# Patient Record
Sex: Female | Born: 1935
Health system: Southern US, Community
[De-identification: ages and names within clinical notes are randomized; demographics above are authoritative.]

## PROBLEM LIST (undated history)

## (undated) DIAGNOSIS — M1990 Unspecified osteoarthritis, unspecified site: Secondary | ICD-10-CM

## (undated) DIAGNOSIS — E079 Disorder of thyroid, unspecified: Secondary | ICD-10-CM

## (undated) DIAGNOSIS — E785 Hyperlipidemia, unspecified: Secondary | ICD-10-CM

## (undated) DIAGNOSIS — M8580 Other specified disorders of bone density and structure, unspecified site: Secondary | ICD-10-CM

## (undated) DIAGNOSIS — F329 Major depressive disorder, single episode, unspecified: Secondary | ICD-10-CM

## (undated) DIAGNOSIS — G479 Sleep disorder, unspecified: Secondary | ICD-10-CM

## (undated) DIAGNOSIS — T8859XA Other complications of anesthesia, initial encounter: Secondary | ICD-10-CM

## (undated) DIAGNOSIS — F32A Depression, unspecified: Secondary | ICD-10-CM

## (undated) HISTORY — DX: Disorder of thyroid, unspecified: E07.9

## (undated) HISTORY — DX: Hyperlipidemia, unspecified: E78.5

## (undated) HISTORY — DX: Sleep disorder, unspecified: G47.9

## (undated) HISTORY — DX: Depression, unspecified: F32.A

## (undated) HISTORY — DX: Other specified disorders of bone density and structure, unspecified site: M85.80

## (undated) HISTORY — DX: Unspecified osteoarthritis, unspecified site: M19.90

## (undated) HISTORY — PX: APPENDECTOMY: SHX54

## (undated) HISTORY — DX: Major depressive disorder, single episode, unspecified: F32.9

## (undated) HISTORY — PX: ABDOMINAL HYSTERECTOMY: SHX81

---
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
## 2003-05-15 ENCOUNTER — Encounter: Payer: Self-pay | Admitting: Internal Medicine

## 2005-04-08 LAB — HM COLONOSCOPY: HM Colonoscopy: NORMAL

## 2010-05-01 ENCOUNTER — Ambulatory Visit: Payer: Self-pay | Admitting: Internal Medicine

## 2010-05-01 DIAGNOSIS — E785 Hyperlipidemia, unspecified: Secondary | ICD-10-CM | POA: Insufficient documentation

## 2010-05-01 DIAGNOSIS — F329 Major depressive disorder, single episode, unspecified: Secondary | ICD-10-CM

## 2010-05-01 DIAGNOSIS — E039 Hypothyroidism, unspecified: Secondary | ICD-10-CM

## 2010-05-01 DIAGNOSIS — M159 Polyosteoarthritis, unspecified: Secondary | ICD-10-CM

## 2010-05-01 DIAGNOSIS — G479 Sleep disorder, unspecified: Secondary | ICD-10-CM

## 2010-07-03 NOTE — Assessment & Plan Note (Signed)
Summary: NEW PT TO [facility]   Vital Signs:  Patient profile:   75 year old female Height:      63.5 inches Weight:      134 pounds BMI:     23.45 Temp:     98.3 degrees F oral Pulse rate:   60 / minute Pulse rhythm:   regular BP sitting:   110 / 68  (left arm) Cuff size:   regular  Vitals Entered By: Mervin Hack CMA Duncan Dull) (May 01, 2010 12:00 PM) CC: new patient to establish care   History of Present Illness: Recently moved to [facility] from [place] area Establishing [facility] care [facility]  Has been feeling okay in general  Longstanding sleep problems Mirtazapine is variably effective Occ was groggy in Am so tried 1/2 but not that great No other meds tried Occ daytime tiredness but not consistent Does best with ibuprofen 600mg  at bedtime Low back pain can be the problem at times  Did have depression after father's death in 26-Oct-1970 has had some recurrent problems with that  Mild hand arthritis problems besides back mostly in hands generally doesn't need daytime meds  Long term laxative dependent uses MOM every night occ adds dulcolax Has tried multiple different things including miralax, probiotics, etc  Has hypothryoidism goes back decades  History of hypercholesterolemia on meds for 3-4 years Not excited about meds for this   Preventive Screening-Counseling & Management  Alcohol-Tobacco     Smoking Status: quit  Allergies (verified): No Known Drug Allergies  Past History:  Past Medical History: Depression Hyperlipidemia Hypothyroidism Osteoarthritis Osteopenia--mild Sleep disorder  Past Surgical History: Hysterectomy/appendectomy-- 1986 Tonsillectomy/adenoids-- 1946  Family History: Dad died of MI at 75. Had HTN Mom died @ 80    dementia Brother died of MI @56 . Had stroke, brain aneurysm. 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 Eyes:  Denies double vision and vision loss-1 eye. ENT:  Denies decreased hearing and ringing in ears; own teeth--regular with dentist. CV:  Denies chest pain or discomfort, difficulty breathing at night, difficulty breathing while lying down, fainting, lightheadness, palpitations, and shortness of breath with exertion; plans to restart regular walking and perhaps try Tai Chi. Resp:  Complains of cough; denies shortness of breath; occ AM clearing cough. GI:  Complains of constipation; denies abdominal pain, bloody stools, dark tarry stools, indigestion, nausea, and vomiting. GU:  Denies dysuria and incontinence; occ urgency and then incomplete emptying Has menopausal symptoms--did well on the hormones but taken off by gyn. MS:  Complains of joint pain; denies joint swelling. Derm:  Denies lesion(s) and rash. Neuro:  Denies headaches, numbness, tingling, and weakness. Psych:  Complains of anxiety and depression; depression has been quiet "worry wart" but no major persistent problems. Heme:  Denies abnormal bruising and enlarge lymph nodes. Allergy:  Denies persistent infections and sneezing.  Physical Exam  General:  alert and normal appearance.   Eyes:  pupils equal, pupils round, and pupils reactive to light.   Mouth:  no erythema, no exudates, and no lesions.   Neck:  supple, no masses, no thyromegaly, no carotid bruits, and no cervical lymphadenopathy.   Lungs:  normal respiratory effort, no intercostal retractions, no accessory muscle use, and normal breath sounds.  Heart:  normal rate, regular rhythm, no murmur, and no gallop.   Abdomen:  soft, non-tender, and no masses.   Msk:  no joint tenderness and no joint swelling.   Pulses:  faint or absent in feet but seems to have good circulation Extremities:  no  edema Neurologic:  alert & oriented X3, strength normal in all extremities, and gait normal.   Skin:  no rashes and no suspicious lesions.   Axillary Nodes:  No palpable lymphadenopathy Psych:  normally interactive, good eye contact, not anxious appearing, and not depressed appearing.     Impression & Recommendations:  Problem # 1:  DEPRESSION (ICD-311) Assessment Comment Only seems controlled on the medication will continue  Her updated medication list for this problem includes:    Mirtazapine 30 Mg Tabs (Mirtazapine) .Marland Kitchen... Take 1 by mouth once daily  Problem # 2:  OSTEOARTHRITIS (ICD-715.90) Assessment: Unchanged okay to take ibuprofen 600mg  at bedtime No GI history so will hold off on PPI for now  Problem # 3:  SLEEP DISORDER, CHRONIC (ICD-780.50) Assessment: Comment Only still an issue though no serious daytime somolence continue mirtazapine since it seems to be helping mood  Problem # 4:  HYPERLIPIDEMIA (ICD-272.4) Assessment: Unchanged okay with meds given strong FH  Her updated medication list for this problem includes:    Simvastatin 20 Mg Tabs (Simvastatin) .Marland Kitchen... Take 1 by mouth once daily  Problem # 5:  HYPOTHYROIDISM (ICD-244.9) Assessment: Comment Only labs reviewed from July  Her updated medication list for this problem includes:    Synthroid 75 Mcg Tabs (Levothyroxine sodium) .Marland Kitchen... Take 1 by mouth once daily  Complete Medication List: 1)  Synthroid 75 Mcg Tabs (Levothyroxine sodium) .... Take 1 by mouth once daily 2)  Simvastatin 20 Mg Tabs (Simvastatin) .... Take 1 by mouth once daily 3)  Mirtazapine 30 Mg Tabs (Mirtazapine) .... Take 1 by mouth once daily 4)  Fish Oil 1000 Mg Caps (Omega-3 fatty acids) .... Once daily 5)  Vision Plus Caps (Multiple vitamins-minerals) .... Once daily 6)  Milk of Magnesia 400 Mg/32ml Susp (Magnesium hydroxide) .... As needed  Other Orders: TD Toxoids IM 7 YR + (16109) Admin 1st Vaccine (60454)  Patient  Instructions: 1)  Please schedule a follow-up appointment in 6 months .  2)  It is okay to take ibuprofen 600mg  at bedtime. Please stop and call if any stomach trouble or change in bowels   Orders Added: 1)  New Patient Level IV [99204] 2)  TD Toxoids IM 7 YR + [90714] 3)  Admin 1st Vaccine [90471]   Immunization History:  Tetanus/Td Immunization History:    Tetanus/Td:  Td (05/01/2010)  Influenza Immunization History:    Influenza:  Historical (03/03/2010)  Pneumovax Immunization History:    Pneumovax:  Historical (06/04/2007)  Zostavax History:    Zostavax # 1:  Zostavax (03/03/2010)  Immunizations Administered:  Tetanus Vaccine:    Vaccine Type: Td    Site: left deltoid    Mfr: Sanofi Pasteur    Dose: 0.5 ml    Route: IM    Given by: Mervin Hack CMA (AAMA)    Exp. Date: 07/05/2011    Lot #: [id number]    VIS given: 04/20/08 version given May 01, 2010.   Immunization History:  Influenza Immunization History:    Influenza:  Historical (03/03/2010)  Zostavax History:    Zostavax # 1:  Zostavax (03/03/2010)  Pneumovax Immunization History:    Pneumovax:  Historical (06/04/2007)  Immunizations Administered:  Tetanus  Vaccine:    Vaccine Type: Td    Site: left deltoid    Mfr: Sanofi Pasteur    Dose: 0.5 ml    Route: IM    Given by: Mervin Hack CMA (AAMA)    Exp. Date: 07/05/2011    Lot #: [id number]    VIS given: 04/20/08 version given May 01, 2010.  Current Allergies (reviewed today): No known allergies       Colonoscopy  Procedure date:  04/08/2005  Findings:      Results: Normal.

## 2010-07-03 NOTE — Letter (Signed)
Summary: [employer] conservation officer [employer] Wellness Visit Questionnaire   [employer] Annual Wellness Visit Questionnaire   Imported By: Beau Fanny 05/01/2010 15:29:56  _____________________________________________________________________  External Attachment:    Type:   Image     Comment:   External Document

## 2010-07-06 NOTE — Letter (Signed)
Summary: Patient Questionnaire  Patient Questionnaire   Imported By: Beau Fanny 05/01/2010 15:30:24  _____________________________________________________________________  External Attachment:    Type:   Image     Comment:   External Document

## 2010-07-06 NOTE — Letter (Signed)
Summary: [facility] Records  [facility] Records   Imported By: Beau Fanny 05/03/2010 09:00:14  _____________________________________________________________________  External Attachment:    Type:   Image     Comment:   External Document

## 2010-08-30 ENCOUNTER — Telehealth: Payer: Self-pay | Admitting: *Deleted

## 2010-08-30 MED ORDER — AMOXICILLIN 500 MG PO CAPS: 500.0000 mg | ORAL_CAPSULE | Freq: Two times a day (BID) | ORAL | Status: AC

## 2010-08-30 NOTE — Telephone Encounter (Signed)
Typically do not call in abx without seeing pt but since we have limited appt availability, ok to send in amoxicillin 500 mg - 1 tab by mouth twice daily for 10 days, no refills.  Please verify that she is not allergic to pcn.

## 2010-08-30 NOTE — Telephone Encounter (Signed)
Rx call to pharmacy, patient notified.

## 2010-08-30 NOTE — Telephone Encounter (Signed)
Pt's husband was seen at [facility] yesterday for URI symptoms and the pt states she now has the same thing.  She is asking that the meds prescribed for her husband yesterday also be prescribed for her.  Uses [facility].

## 2010-08-30 NOTE — Telephone Encounter (Signed)
See my note below

## 2010-10-24 ENCOUNTER — Encounter: Payer: Self-pay | Admitting: Internal Medicine

## 2010-10-30 ENCOUNTER — Encounter: Payer: Self-pay | Admitting: Internal Medicine

## 2010-10-30 ENCOUNTER — Ambulatory Visit (INDEPENDENT_AMBULATORY_CARE_PROVIDER_SITE_OTHER): Payer: Medicare Other | Admitting: Internal Medicine

## 2010-10-30 VITALS — BP 112/70 | HR 68 | Temp 98.0°F | Wt 135.0 lb

## 2010-10-30 DIAGNOSIS — K5909 Other constipation: Secondary | ICD-10-CM

## 2010-10-30 DIAGNOSIS — E039 Hypothyroidism, unspecified: Secondary | ICD-10-CM

## 2010-10-30 DIAGNOSIS — F329 Major depressive disorder, single episode, unspecified: Secondary | ICD-10-CM

## 2010-10-30 DIAGNOSIS — E785 Hyperlipidemia, unspecified: Secondary | ICD-10-CM

## 2010-10-30 DIAGNOSIS — F3289 Other specified depressive episodes: Secondary | ICD-10-CM

## 2010-10-30 DIAGNOSIS — G479 Sleep disorder, unspecified: Secondary | ICD-10-CM

## 2010-10-30 DIAGNOSIS — M1990 Unspecified osteoarthritis, unspecified site: Secondary | ICD-10-CM

## 2010-10-30 LAB — LIPID PANEL
Cholesterol: 228 mg/dL — ABNORMAL HIGH (ref 0–200)
HDL: 101.1 mg/dL (ref >39.00)
Triglycerides: 51 mg/dL (ref 0.0–149.0)
VLDL: 10.2 mg/dL (ref 0.0–40.0)

## 2010-10-30 LAB — CBC WITH DIFFERENTIAL/PLATELET
Eosinophils Relative: 5.3 % — ABNORMAL HIGH (ref 0.0–5.0)
HCT: 35.9 % — ABNORMAL LOW (ref 36.0–46.0)
Lymphs Abs: 1.2 10*3/uL (ref 0.7–4.0)
Monocytes Relative: 8 % (ref 3.0–12.0)
Platelets: 201 10*3/uL (ref 150.0–400.0)
WBC: 4.6 10*3/uL (ref 4.5–10.5)

## 2010-10-30 LAB — T4, FREE: Free T4: 0.76 ng/dL (ref 0.60–1.60)

## 2010-10-30 LAB — BASIC METABOLIC PANEL
BUN: 23 mg/dL (ref 6–23)
Calcium: 9.6 mg/dL (ref 8.4–10.5)
Creatinine, Ser: 0.8 mg/dL (ref 0.4–1.2)
GFR: 70.66 mL/min (ref >60.00)

## 2010-10-30 LAB — TSH: TSH: 4.07 u[IU]/mL (ref 0.35–5.50)

## 2010-10-30 LAB — HEPATIC FUNCTION PANEL
AST: 30 U/L (ref 0–37)
Albumin: 4.1 g/dL (ref 3.5–5.2)

## 2010-10-30 LAB — LDL CHOLESTEROL, DIRECT: Direct LDL: 129.1 mg/dL

## 2010-10-30 MED ORDER — LEVOTHYROXINE SODIUM 75 MCG PO TABS: 75.0000 ug | ORAL_TABLET | Freq: Every day | ORAL | Status: DC

## 2010-10-30 MED ORDER — MIRTAZAPINE 30 MG PO TABS: 30.0000 mg | ORAL_TABLET | Freq: Every day | ORAL | Status: DC

## 2010-10-30 MED ORDER — SIMVASTATIN 20 MG PO TABS: 20.0000 mg | ORAL_TABLET | Freq: Every day | ORAL | Status: DC

## 2010-10-30 NOTE — Progress Notes (Signed)
Subjective:    Patient ID: Robin Logan, female    DOB: 11/07/1935, 75 y.o.   MRN: [id number]  HPI Husband just had hernia surgery---laying low due to this.  He is doing well No new concerns from her  Really "loves it" at [facility] Involved with wellness program Independent exercise an zumba also  Continues to be laxative dependent MOM every night---adds dulcolax if needed Goes the next day usually. Generally goes at least every 3 days  Still on the statin No myalgias Still comfortable taking due to FH  Sleep is still a problem Uses the mirtazapine still--may be somewhat better No mood problems--not depressed  No sig arthritis problems  Current outpatient prescriptions:fish oil-omega-3 fatty acids 1000 MG capsule, Take 2 g by mouth daily.  , Disp: , Rfl: ;  levothyroxine (SYNTHROID, LEVOTHROID) 75 MCG tablet, Take 1 tablet (75 mcg total) by mouth daily., Disp: 90 tablet, Rfl: 3;  magnesium hydroxide (MILK OF MAGNESIA) 400 MG/5ML suspension, Take by mouth daily as needed.  , Disp: , Rfl:  mirtazapine (REMERON) 30 MG tablet, Take 1 tablet (30 mg total) by mouth at bedtime., Disp: 90 tablet, Rfl: 3;  Multiple Vitamins-Minerals (VISION PLUS PO), Take by mouth daily.  , Disp: , Rfl: ;  simvastatin (ZOCOR) 20 MG tablet, Take 1 tablet (20 mg total) by mouth at bedtime., Disp: 90 tablet, Rfl: 3;  DISCONTD: levothyroxine (SYNTHROID, LEVOTHROID) 75 MCG tablet, Take 75 mcg by mouth daily.  , Disp: , Rfl:  DISCONTD: mirtazapine (REMERON) 30 MG tablet, Take 30 mg by mouth at bedtime.  , Disp: , Rfl: ;  DISCONTD: simvastatin (ZOCOR) 20 MG tablet, Take 20 mg by mouth at bedtime.  , Disp: , Rfl:   Past Medical History  Diagnosis Date  . Depression   . Hyperlipidemia   . Thyroid disease   . Arthritis   . Osteopenia   . Sleep disorder     Past Surgical History  Procedure Date  . Appendectomy   . Abdominal hysterectomy   . Tonsillectomy and adenoidectomy 1946    Family History    Problem Relation Age of Onset  . Hypertension Father   . Stroke Brother   . Diabetes Brother   . Cancer Neg Hx     no breast or colon    History   Social History  . Marital Status: Married    Spouse Name: N/A    Number of Children: N/A  . Years of Education: N/A   Occupational History  . 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    Social History Main Topics  . Smoking status: Former Smoker    Quit date: 06/03/1978  . Smokeless tobacco: Not on file  . Alcohol Use: Yes     wine  . Drug Use: No  . Sexually Active: Not on file   Other Topics Concern  . Not on file   Social History Narrative  . No narrative on file   Review of Systems Appetite is good Weight is stable Gets some urinary urgency--then delay     Objective:   Physical Exam  Constitutional: She appears well-developed and well-nourished. No distress.  Neck: Normal range of motion. Neck supple. No thyromegaly present.  Cardiovascular: Normal rate, regular rhythm and normal heart sounds.  Exam reveals no gallop.   No murmur heard. Pulmonary/Chest: Effort normal and breath sounds normal. No respiratory distress. She has no wheezes. She has no rales.  Abdominal: Soft. There is no tenderness.  Musculoskeletal: Normal range of motion. She exhibits no edema and no tenderness.  Lymphadenopathy:    She has no cervical adenopathy.  Psychiatric: She has a normal mood and affect. Her behavior is normal. Judgment and thought content normal.          Assessment & Plan:

## 2010-12-24 ENCOUNTER — Encounter: Payer: Self-pay | Admitting: Internal Medicine

## 2010-12-24 ENCOUNTER — Telehealth: Payer: Self-pay | Admitting: *Deleted

## 2010-12-24 NOTE — Telephone Encounter (Signed)
Order written Please copy for scanning

## 2010-12-24 NOTE — Telephone Encounter (Signed)
Patient walked in asking for DNR, please advise

## 2010-12-24 NOTE — Telephone Encounter (Signed)
Left message on machine that DNR is ready for pick-up, and it will be at our front desk.  

## 2011-05-07 ENCOUNTER — Ambulatory Visit: Payer: Medicare Other | Admitting: Internal Medicine

## 2011-05-21 ENCOUNTER — Encounter: Payer: Self-pay | Admitting: Internal Medicine

## 2011-05-21 ENCOUNTER — Ambulatory Visit (INDEPENDENT_AMBULATORY_CARE_PROVIDER_SITE_OTHER): Payer: Medicare Other | Admitting: Internal Medicine

## 2011-05-21 VITALS — BP 110/70 | HR 60 | Temp 97.4°F | Ht 63.0 in | Wt 138.0 lb

## 2011-05-21 DIAGNOSIS — K5909 Other constipation: Secondary | ICD-10-CM

## 2011-05-21 DIAGNOSIS — G479 Sleep disorder, unspecified: Secondary | ICD-10-CM

## 2011-05-21 DIAGNOSIS — M949 Disorder of cartilage, unspecified: Secondary | ICD-10-CM

## 2011-05-21 DIAGNOSIS — E785 Hyperlipidemia, unspecified: Secondary | ICD-10-CM

## 2011-05-21 DIAGNOSIS — F329 Major depressive disorder, single episode, unspecified: Secondary | ICD-10-CM

## 2011-05-21 DIAGNOSIS — E039 Hypothyroidism, unspecified: Secondary | ICD-10-CM

## 2011-05-21 NOTE — Assessment & Plan Note (Signed)
chronic laxative dependence No other regimens helped her---extensively tried

## 2011-05-21 NOTE — Assessment & Plan Note (Signed)
In remission On the mirtazapine still for sleep

## 2011-05-21 NOTE — Assessment & Plan Note (Signed)
On the vitamin D Has good dietary calcium intake Will recheck DEXA

## 2011-05-21 NOTE — Assessment & Plan Note (Signed)
Euthyroid Labs again next time

## 2011-05-21 NOTE — Assessment & Plan Note (Addendum)
LDL 101 Doing fine on the med

## 2011-05-21 NOTE — Progress Notes (Signed)
  Subjective:    Patient ID: Robin Logan, female    DOB: 09-Feb-1936, 75 y.o.   MRN: [id number]  HPI Doing okay Still active in the fitness program Zumba and core programs  Still really enjoying it at [facility] at [facility] Has happy hour weekly with a group, etc  Still on vitamin D Hasn't had DEXA in some time  Mood has been good Still needs the mirtazapine--but sleeps well with this  Still laxative dependent Has a routine every 3rd night or so--usually then has to be restricted the next morning Did have brief success with biofeedback--then didn't work the second time  Current Outpatient Prescriptions on File Prior to Visit  Medication Sig Dispense Refill  . levothyroxine (SYNTHROID, LEVOTHROID) 75 MCG tablet Take 1 tablet (75 mcg total) by mouth daily.  90 tablet  3  . magnesium hydroxide (MILK OF MAGNESIA) 400 MG/5ML suspension Take by mouth daily as needed.        . mirtazapine (REMERON) 30 MG tablet Take 1 tablet (30 mg total) by mouth at bedtime.  90 tablet  3  . Multiple Vitamins-Minerals (VISION PLUS PO) Take by mouth daily.        . simvastatin (ZOCOR) 20 MG tablet Take 1 tablet (20 mg total) by mouth at bedtime.  90 tablet  3    No Known Allergies  Past Medical History  Diagnosis Date  . Depression   . Hyperlipidemia   . Thyroid disease   . Arthritis   . Osteopenia   . Sleep disorder     Past Surgical History  Procedure Date  . Appendectomy   . Abdominal hysterectomy   . Tonsillectomy and adenoidectomy 1946    Family History  Problem Relation Age of Onset  . Hypertension Father   . Stroke Brother   . Diabetes Brother   . Cancer Neg Hx     no breast or colon    History   Social History  . Marital Status: Married    Spouse Name: N/A    Number of Children: N/A  . Years of Education: N/A   Occupational History  . 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    Social History Main Topics  . Smoking status: Former Smoker    Quit date: 06/03/1978  . Smokeless tobacco: Never Used  . Alcohol Use: Yes     wine  . Drug Use: No  . Sexually Active: Not on file   Other Topics Concern  . Not on file   Social History Narrative   Requests DNR --order done 12/24/10   Review of Systems Appetite is fine Weight stable Gets urinary urgency at times--then feels she can't empty bladder    Objective:   Physical Exam  Constitutional: She appears well-developed and well-nourished. No distress.  Neck: Normal range of motion. Neck supple. No thyromegaly present.  Cardiovascular: Normal rate, regular rhythm, normal heart sounds and intact distal pulses.  Exam reveals no gallop.   No murmur heard. Pulmonary/Chest: Effort normal and breath sounds normal. No respiratory distress. She has no wheezes. She has no rales.  Abdominal: Soft. There is no tenderness.  Musculoskeletal: Normal range of motion. She exhibits no edema and no tenderness.  Lymphadenopathy:    She has no cervical adenopathy.  Psychiatric: She has a normal mood and affect. Her behavior is normal. Judgment and thought content normal.          Assessment & Plan:

## 2011-05-21 NOTE — Assessment & Plan Note (Signed)
Does well with the mirtazapine Will continue

## 2011-05-21 NOTE — Patient Instructions (Signed)
Please set up bone density test

## 2011-06-11 ENCOUNTER — Ambulatory Visit: Payer: Self-pay | Admitting: Internal Medicine

## 2011-06-11 DIAGNOSIS — M899 Disorder of bone, unspecified: Secondary | ICD-10-CM | POA: Diagnosis not present

## 2011-06-11 DIAGNOSIS — M949 Disorder of cartilage, unspecified: Secondary | ICD-10-CM | POA: Diagnosis not present

## 2011-06-12 ENCOUNTER — Encounter: Payer: Self-pay | Admitting: Internal Medicine

## 2011-06-17 ENCOUNTER — Encounter: Payer: Self-pay | Admitting: *Deleted

## 2011-09-16 DIAGNOSIS — J209 Acute bronchitis, unspecified: Secondary | ICD-10-CM | POA: Diagnosis not present

## 2011-11-19 ENCOUNTER — Encounter: Payer: Self-pay | Admitting: Internal Medicine

## 2011-11-19 ENCOUNTER — Ambulatory Visit (INDEPENDENT_AMBULATORY_CARE_PROVIDER_SITE_OTHER): Payer: Medicare Other | Admitting: Internal Medicine

## 2011-11-19 VITALS — BP 110/70 | HR 68 | Temp 97.9°F | Ht 63.0 in | Wt 139.0 lb

## 2011-11-19 DIAGNOSIS — G479 Sleep disorder, unspecified: Secondary | ICD-10-CM | POA: Diagnosis not present

## 2011-11-19 DIAGNOSIS — M545 Low back pain: Secondary | ICD-10-CM | POA: Diagnosis not present

## 2011-11-19 DIAGNOSIS — E785 Hyperlipidemia, unspecified: Secondary | ICD-10-CM | POA: Diagnosis not present

## 2011-11-19 DIAGNOSIS — K5909 Other constipation: Secondary | ICD-10-CM | POA: Diagnosis not present

## 2011-11-19 DIAGNOSIS — E039 Hypothyroidism, unspecified: Secondary | ICD-10-CM | POA: Diagnosis not present

## 2011-11-19 DIAGNOSIS — Z0000 Encounter for general adult medical examination without abnormal findings: Secondary | ICD-10-CM | POA: Insufficient documentation

## 2011-11-19 DIAGNOSIS — Z1231 Encounter for screening mammogram for malignant neoplasm of breast: Secondary | ICD-10-CM

## 2011-11-19 LAB — CBC WITH DIFFERENTIAL/PLATELET
Basophils Absolute: 0 10*3/uL (ref 0.0–0.1)
Eosinophils Absolute: 0.3 10*3/uL (ref 0.0–0.7)
HCT: 36 % (ref 36.0–46.0)
Hemoglobin: 11.8 g/dL — ABNORMAL LOW (ref 12.0–15.0)
Lymphs Abs: 1.4 10*3/uL (ref 0.7–4.0)
MCHC: 32.8 g/dL (ref 30.0–36.0)
Monocytes Absolute: 0.4 10*3/uL (ref 0.1–1.0)
Neutro Abs: 3.1 10*3/uL (ref 1.4–7.7)
RDW: 13.3 % (ref 11.5–14.6)

## 2011-11-19 LAB — LIPID PANEL: HDL: 103.3 mg/dL (ref >39.00)

## 2011-11-19 LAB — HEPATIC FUNCTION PANEL
Albumin: 4.1 g/dL (ref 3.5–5.2)
Total Protein: 6.9 g/dL (ref 6.0–8.3)

## 2011-11-19 LAB — BASIC METABOLIC PANEL
CO2: 27 mEq/L (ref 19–32)
Calcium: 9.1 mg/dL (ref 8.4–10.5)
Glucose, Bld: 89 mg/dL (ref 70–99)
Sodium: 143 mEq/L (ref 135–145)

## 2011-11-19 MED ORDER — MIRTAZAPINE 30 MG PO TABS: 30.0000 mg | ORAL_TABLET | Freq: Every day | ORAL | Status: DC

## 2011-11-19 NOTE — Assessment & Plan Note (Signed)
Discussed trying miralax again in addition to other laxatives

## 2011-11-19 NOTE — Assessment & Plan Note (Signed)
Has really been limiting her Will set up PT evaluation at [facility]

## 2011-11-19 NOTE — Progress Notes (Signed)
Subjective:    Patient ID: Robin Logan, female    DOB: [birth year], 76 y.o.   MRN: [id number]  HPI Here for Medicare wellness No falls or fear of falls No depression or anhedonia--though frustrated with medical issues Reviewed advanced directives Due for mammo  Had to stop some of her zumba and core exercises due to back pain Still didn't improve Relates to past disc problems Can't get comfortable at night---trouble sleeping Ibuprofen 600mg  does help Pain in low back into both hip/upper thighs No clear exacerbating factors No leg weakness  Still laxative dependent Feels her cholesterol med worsens this Wants to try off the med No apparent myalgia with this  Same thyroid med Feels her appetite is higher but weight fairly stable  Irritated by back pain, etc Some frustration but not depressed Not anhedonic Current Outpatient Prescriptions on File Prior to Visit  Medication Sig Dispense Refill  . cholecalciferol (VITAMIN D) 1000 UNITS tablet Take 1,000 Units by mouth daily.        Marland Kitchen levothyroxine (SYNTHROID, LEVOTHROID) 75 MCG tablet Take 1 tablet (75 mcg total) by mouth daily.  90 tablet  3  . magnesium hydroxide (MILK OF MAGNESIA) 400 MG/5ML suspension Take by mouth daily as needed.        . Multiple Vitamins-Minerals (VISION PLUS PO) Take by mouth daily.        . simvastatin (ZOCOR) 20 MG tablet Take 1 tablet (20 mg total) by mouth at bedtime.  90 tablet  3  . DISCONTD: mirtazapine (REMERON) 30 MG tablet Take 1 tablet (30 mg total) by mouth at bedtime.  90 tablet  3    No Known Allergies  Past Medical History  Diagnosis Date  . Depression   . Hyperlipidemia   . Thyroid disease   . Arthritis   . Osteopenia   . Sleep disorder     Past Surgical History  Procedure Date  . Appendectomy   . Abdominal hysterectomy   . Tonsillectomy and adenoidectomy 1946    Family History  Problem Relation Age of Onset  . Hypertension Father   . Stroke Brother   . Diabetes  Brother   . Cancer Neg Hx     no breast or colon    History   Social History  . Marital Status: Married    Spouse Name: N/A    Number of Children: N/A  . Years of Education: N/A   Occupational History  . 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    Social History Main Topics  . Smoking status: Former Smoker    Quit date: 06/03/1978  . Smokeless tobacco: Never Used  . Alcohol Use: Yes     wine  . Drug Use: No  . Sexually Active: Not on file   Other Topics Concern  . Not on file   Social History Narrative   Requests DNR --order done 7/23/12Has living willHusband is health care POANo tube feedings if cognitively unaware   Review of Systems Urine slow--even after urgency. No dysuria No blood in stool    Objective:   Physical Exam  Constitutional: She is oriented to person, place, and time. She appears well-developed and well-nourished. No distress.  Neck: Normal range of motion. Neck supple. No thyromegaly present.  Cardiovascular: Normal rate, regular rhythm, normal heart sounds and intact distal pulses.  Exam reveals no gallop.   No murmur heard. Pulmonary/Chest: Effort normal and breath sounds normal. No respiratory distress. She has no wheezes. She has no  rales.  Abdominal: Soft. There is no tenderness.  Genitourinary:       Breasts without masses  Musculoskeletal:       Normal back flexion SLR negative Pain area is mostly lumbar paraspinals but not tender  Lymphadenopathy:    She has no cervical adenopathy.    She has no axillary adenopathy.  Neurological: She is alert and oriented to person, place, and time.       President-- "Obama, Bush, Clinton" [phone] (with difficulty)-[phone] [id number] Recall 2/3  No leg weakness Normal gait  Psychiatric: She has a normal mood and affect. Her behavior is normal.          Assessment & Plan:

## 2011-11-19 NOTE — Assessment & Plan Note (Signed)
Does okay with the mirtazapine Some trouble due to back lately Will continue--esp with past depression

## 2011-11-19 NOTE — Assessment & Plan Note (Signed)
Feels the med may be worsening constipation Will try off

## 2011-11-19 NOTE — Patient Instructions (Signed)
Please try off the cholesterol med to see if it helps your bowels. If your constipation improves, stay off the simvastatin

## 2011-11-19 NOTE — Assessment & Plan Note (Signed)
Due for labs

## 2011-11-19 NOTE — Assessment & Plan Note (Signed)
I have personally reviewed the Medicare Annual Wellness questionnaire and have noted 1. The patient's medical and social history 2. Their use of alcohol, tobacco or illicit drugs 3. Their current medications and supplements 4. The patient's functional ability including ADL's, fall risks, home safety risks and hearing or visual             impairment. 5. Diet and physical activities 6. Evidence for depression or mood disorders  The patients weight, height, BMI and visual acuity have been recorded in the chart I have made referrals, counseling and provided education to the patient based review of the above and I have provided the pt with a written personalized care plan for preventive services.  I have provided you with a copy of your personalized plan for preventive services. Please take the time to review along with your updated medication list.  Due for mammo UTD otherwise

## 2011-11-20 ENCOUNTER — Encounter: Payer: Self-pay | Admitting: *Deleted

## 2011-11-21 DIAGNOSIS — M545 Low back pain: Secondary | ICD-10-CM | POA: Diagnosis not present

## 2011-11-21 DIAGNOSIS — R262 Difficulty in walking, not elsewhere classified: Secondary | ICD-10-CM | POA: Diagnosis not present

## 2011-11-22 DIAGNOSIS — R262 Difficulty in walking, not elsewhere classified: Secondary | ICD-10-CM | POA: Diagnosis not present

## 2011-11-22 DIAGNOSIS — M545 Low back pain: Secondary | ICD-10-CM | POA: Diagnosis not present

## 2011-11-25 DIAGNOSIS — R262 Difficulty in walking, not elsewhere classified: Secondary | ICD-10-CM | POA: Diagnosis not present

## 2011-11-25 DIAGNOSIS — M545 Low back pain: Secondary | ICD-10-CM | POA: Diagnosis not present

## 2011-11-26 DIAGNOSIS — M545 Low back pain: Secondary | ICD-10-CM | POA: Diagnosis not present

## 2011-11-26 DIAGNOSIS — R262 Difficulty in walking, not elsewhere classified: Secondary | ICD-10-CM | POA: Diagnosis not present

## 2011-11-28 DIAGNOSIS — R262 Difficulty in walking, not elsewhere classified: Secondary | ICD-10-CM | POA: Diagnosis not present

## 2011-11-28 DIAGNOSIS — M545 Low back pain: Secondary | ICD-10-CM | POA: Diagnosis not present

## 2011-12-02 ENCOUNTER — Ambulatory Visit: Payer: Self-pay | Admitting: Internal Medicine

## 2011-12-02 DIAGNOSIS — Z1231 Encounter for screening mammogram for malignant neoplasm of breast: Secondary | ICD-10-CM | POA: Diagnosis not present

## 2011-12-02 DIAGNOSIS — M545 Low back pain: Secondary | ICD-10-CM | POA: Diagnosis not present

## 2011-12-02 DIAGNOSIS — R262 Difficulty in walking, not elsewhere classified: Secondary | ICD-10-CM | POA: Diagnosis not present

## 2011-12-03 DIAGNOSIS — M545 Low back pain: Secondary | ICD-10-CM | POA: Diagnosis not present

## 2011-12-03 DIAGNOSIS — R262 Difficulty in walking, not elsewhere classified: Secondary | ICD-10-CM | POA: Diagnosis not present

## 2011-12-06 DIAGNOSIS — R262 Difficulty in walking, not elsewhere classified: Secondary | ICD-10-CM | POA: Diagnosis not present

## 2011-12-06 DIAGNOSIS — M545 Low back pain: Secondary | ICD-10-CM | POA: Diagnosis not present

## 2011-12-09 DIAGNOSIS — R262 Difficulty in walking, not elsewhere classified: Secondary | ICD-10-CM | POA: Diagnosis not present

## 2011-12-09 DIAGNOSIS — M545 Low back pain: Secondary | ICD-10-CM | POA: Diagnosis not present

## 2011-12-10 ENCOUNTER — Telehealth: Payer: Self-pay

## 2011-12-10 DIAGNOSIS — M545 Low back pain: Secondary | ICD-10-CM | POA: Diagnosis not present

## 2011-12-10 DIAGNOSIS — R262 Difficulty in walking, not elsewhere classified: Secondary | ICD-10-CM | POA: Diagnosis not present

## 2011-12-10 NOTE — Telephone Encounter (Signed)
Robin Logan PT at [facility]; pt has been getting PT exercising since 11/19/11; pt is no better, having constant low back pain. Anrita wants to know if need further testing or f/u appt.Please advise.

## 2011-12-10 NOTE — Telephone Encounter (Signed)
Please set up follow up with me or Dr Patsy Lager to reevaluate back

## 2011-12-11 NOTE — Telephone Encounter (Signed)
.  left message to have patient return my call. Spoke with Anrita at [facility] and advised pt needs a follow-up appt and she advised that pt was out of town until Monday and she will tell pt to call for a OV on Monday.

## 2011-12-16 ENCOUNTER — Encounter: Payer: Self-pay | Admitting: *Deleted

## 2011-12-16 DIAGNOSIS — R262 Difficulty in walking, not elsewhere classified: Secondary | ICD-10-CM | POA: Diagnosis not present

## 2011-12-16 DIAGNOSIS — M545 Low back pain: Secondary | ICD-10-CM | POA: Diagnosis not present

## 2011-12-17 DIAGNOSIS — R262 Difficulty in walking, not elsewhere classified: Secondary | ICD-10-CM | POA: Diagnosis not present

## 2011-12-17 DIAGNOSIS — M545 Low back pain: Secondary | ICD-10-CM | POA: Diagnosis not present

## 2011-12-17 NOTE — Telephone Encounter (Signed)
Patient called and schedule appt with Dr.Copland to have back re-evaluated.

## 2011-12-19 DIAGNOSIS — M545 Low back pain: Secondary | ICD-10-CM | POA: Diagnosis not present

## 2011-12-19 DIAGNOSIS — R262 Difficulty in walking, not elsewhere classified: Secondary | ICD-10-CM | POA: Diagnosis not present

## 2011-12-25 ENCOUNTER — Ambulatory Visit (INDEPENDENT_AMBULATORY_CARE_PROVIDER_SITE_OTHER)
Admission: RE | Admit: 2011-12-25 | Discharge: 2011-12-25 | Disposition: A | Discharge status: Home / Self Care | Payer: Medicare Other | Source: Ambulatory Visit | Attending: Family Medicine | Admitting: Family Medicine

## 2011-12-25 ENCOUNTER — Ambulatory Visit (INDEPENDENT_AMBULATORY_CARE_PROVIDER_SITE_OTHER): Payer: Medicare Other | Admitting: Family Medicine

## 2011-12-25 ENCOUNTER — Encounter: Payer: Self-pay | Admitting: Family Medicine

## 2011-12-25 VITALS — BP 120/70 | HR 93 | Temp 98.2°F | Ht 63.0 in | Wt 135.0 lb

## 2011-12-25 DIAGNOSIS — M545 Low back pain: Secondary | ICD-10-CM

## 2011-12-25 DIAGNOSIS — M47817 Spondylosis without myelopathy or radiculopathy, lumbosacral region: Secondary | ICD-10-CM | POA: Diagnosis not present

## 2011-12-25 MED ORDER — LEVOTHYROXINE SODIUM 75 MCG PO TABS: 75.0000 ug | ORAL_TABLET | Freq: Every day | ORAL | Status: DC

## 2011-12-25 NOTE — Progress Notes (Signed)
Nature conservation officer at [facility] [address] [place] [address] Phone: [phone] Fax: [phone]   Patient Name: Robin Logan Date of Birth: 28-Dec-1935 Medical Record Number: [id number] Gender: female  PCP: Tillman Abide, MD  History of Present Illness:  Robin Logan is a 76 y.o. very pleasant female patient who presents with the following: Back Pain  ongoing for approximately: 10 years, with worsening over the past 3-4 months The patient has had back pain before. The back pain is localized into the lumbar spine area. They also describe no radiculopathy.  No numbness or tingling. No bowel or bladder incontinence. No focal weakness. Prior interventions: ESI in about 2003 with good result Physical therapy: yes Chiropractic manipulations: No Acupuncture: No Osteopathic manipulation: No Heat or cold: Minimal effect  The patient was initially diagnosed with a herniated disc 10 years ago. Ultimately she required epidural steroid injections as well as physical therapy. She does do some daily: A rehabilitation and flexibility training. She has been doing some dancing as well as core classes starting in April 2013. She was not doing that well and plateaued, but over the weekend, she did get to the mountains and she essentially has had minimal back pain over the last 5-6 days.  Past Medical History, Surgical History, Family History, Medications, Allergies have been reviewed and updated if relevant.  Review of Systems  GEN: No fevers, chills. Nontoxic. Primarily MSK c/o today. MSK: Detailed in the HPI GI: tolerating PO intake without difficulty Neuro: As above  Otherwise the pertinent positives of the ROS are noted above.    Physical Exam  Filed Vitals:   12/25/11 1130  BP: 120/70  Pulse: 93  Temp: 98.2 F (36.8 C)  TempSrc: Oral  Height: 5\' 3"  (1.6 m)  Weight: 135 lb (61.236 kg)  SpO2: 97%    Gen: Well-developed,well-nourished,in no acute distress;  alert,appropriate and cooperative throughout examination HEENT: Normocephalic and atraumatic without obvious abnormalities.  Ears, externally no deformities Pulm: Breathing comfortably in no respiratory distress Range of motion at  the waist: Flexion, rotation and lateral bending: Full extension. Flexion to 120. Normal lateral bending and rotation.  No echymosis or edema Rises to examination table with no difficulty Gait: minimally antalgic  Inspection/Deformity: No abnormality Paraspinus T:  Minimally tender  B Ankle Dorsiflexion (L5,4): 5/5 B Great Toe Dorsiflexion (L5,4): 5/5 Heel Walk (L5): WNL Toe Walk (S1): WNL Rise/Squat (L4): WNL, mild pain  SENSORY B Medial Foot (L4): WNL B Dorsum (L5): WNL B Lateral (S1): WNL Light Touch: WNL Pinprick: WNL  REFLEXES Knee (L4): 2+ Ankle (S1): 2+  B SLR, seated: neg B SLR, supine: neg B FABER: neg B Reverse FABER: neg B Greater Troch: NT B Log Roll: neg B Stork: NT B Sciatic Notch: NT   Assessment and Plan:  Back pain  1. Low back pain  DG Lumbar Spine Complete    >25 minutes spent in face to face time with patient, >50% spent in counselling or coordination of care  At this point, her back is calm down in the last week. Dullness essentially think we need to do much anything more than she is already doing. Continue with basic back rehabilitation. Motrin on an as-needed basis. Continue with home rehabilitation. Alter her workout routines and limit things that cause pain. We discussed these in the office. She also try some heat after working out as well and predose with Motrin prior to exercise.  Start with medications, core rehab, and progress from there  following low back pain algorithm. No red flags are present.  Dg Lumbar Spine Complete  12/25/2011  *RADIOLOGY REPORT*  Clinical Data: Back pain.  LUMBAR SPINE - COMPLETE 4+ VIEW  Comparison: None.  Findings: There is marked convex right scoliosis with the apex at L3.   Vertebral body height is maintained.  Loss of disc space height appears worst at from L3-4 to L5-S1.  There is facet degenerative disease of the lower lumbar spine.  IMPRESSION:  1.  No acute finding. 2.  Marked convex right scoliosis and lower lumbar degenerative change.  Original Report Authenticated By: Bernadene Bell. Maricela Curet, M.D.

## 2011-12-26 DIAGNOSIS — M545 Low back pain: Secondary | ICD-10-CM | POA: Diagnosis not present

## 2011-12-26 DIAGNOSIS — R262 Difficulty in walking, not elsewhere classified: Secondary | ICD-10-CM | POA: Diagnosis not present

## 2012-01-20 ENCOUNTER — Encounter: Payer: Self-pay | Admitting: Family Medicine

## 2012-01-20 ENCOUNTER — Ambulatory Visit (INDEPENDENT_AMBULATORY_CARE_PROVIDER_SITE_OTHER): Payer: Medicare Other | Admitting: Family Medicine

## 2012-01-20 VITALS — BP 100/70 | HR 72 | Temp 98.6°F | Ht 63.0 in | Wt 137.2 lb

## 2012-01-20 DIAGNOSIS — M545 Low back pain: Secondary | ICD-10-CM

## 2012-01-20 MED ORDER — PREDNISONE 20 MG PO TABS: ORAL_TABLET | ORAL | Status: AC

## 2012-01-20 NOTE — Patient Instructions (Addendum)
REFERRAL: GO THE THE FRONT ROOM AT THE ENTRANCE OF OUR CLINIC, NEAR CHECK IN. ASK FOR MARION. SHE WILL HELP YOU SET UP YOUR REFERRAL. DATE: TIME:  

## 2012-01-20 NOTE — Progress Notes (Signed)
Nature conservation officer at [facility] [address] [place] [address] Phone: [phone] Fax: [phone]  Date:  01/20/2012   Name:  Robin Logan   DOB:  February 26, 1936   MRN:  [id number] Gender: female  Age: 76 y.o.  PCP:  Tillman Abide, MD    Chief Complaint: Back Pain   History of Present Illness:  Robin Logan is a 76 y.o. pleasant patient who presents with the following:  F/u 10 year back pain history:  History of herniated disc 10 years ago, ultimately requiring epidural steroid injections. I saw her one month ago, and we were going to continue with conservative management, rehabilitation, continuing with home exercises she had been doing under initial direction of physical therapy. She has been on anti-inflammatories. She bought a new mattress since her last office visit, but this did not help her back at all. She is actually significantly worse compared to my initial evaluation.  No radiculopathy. No numbness or tingling. No bowel or bladder incontinence.  Went to [employer] last time, has been working with PT, then bought a new memory foam mattress and   Living in [place], had some injections in the back, and went to the pain clinic.  Went to a month of PT, has continued rehab for about 2 months at home.  Tried Zumba a few times recently  The patient was initially diagnosed with a herniated disc 10 years ago. Ultimately she required epidural steroid injections as well as physical therapy. She does do some daily: A rehabilitation and flexibility training. She has been doing some dancing as well as core classes starting in April 2013.  Patient Active Problem List  Diagnosis  . HYPOTHYROIDISM  . HYPERLIPIDEMIA  . DEPRESSION  . OSTEOARTHRITIS  . OSTEOPENIA  . SLEEP DISORDER, CHRONIC  . Other constipation  . Routine general medical examination at a health care facility  . Lumbar back pain    Past Medical History  Diagnosis Date  . Depression   .  Hyperlipidemia   . Thyroid disease   . Arthritis   . Osteopenia   . Sleep disorder     Past Surgical History  Procedure Date  . Appendectomy   . Abdominal hysterectomy   . Tonsillectomy and adenoidectomy 1946    History  Substance Use Topics  . Smoking status: Former Smoker    Quit date: 06/03/1978  . Smokeless tobacco: Never Used  . Alcohol Use: Yes     wine    Family History  Problem Relation Age of Onset  . Hypertension Father   . Stroke Brother   . Diabetes Brother   . Cancer Neg Hx     no breast or colon    No Known Allergies  Medication list has been reviewed and updated.  Current Outpatient Prescriptions on File Prior to Visit  Medication Sig Dispense Refill  . cholecalciferol (VITAMIN D) 1000 UNITS tablet Take 1,000 Units by mouth daily.        Marland Kitchen levothyroxine (SYNTHROID, LEVOTHROID) 75 MCG tablet Take 1 tablet (75 mcg total) by mouth daily.  90 tablet  3  . magnesium hydroxide (MILK OF MAGNESIA) 400 MG/5ML suspension Take by mouth daily as needed.        . mirtazapine (REMERON) 30 MG tablet Take 1 tablet (30 mg total) by mouth at bedtime.  90 tablet  3  . Multiple Vitamins-Minerals (VISION PLUS PO) Take by mouth daily.        . polyethylene glycol (  MIRALAX / GLYCOLAX) packet Take 17 g by mouth daily.      . simvastatin (ZOCOR) 20 MG tablet Take 1 tablet (20 mg total) by mouth at bedtime.  90 tablet  3    Review of Systems:   GEN: No fevers, chills. Nontoxic. Primarily MSK c/o today. MSK: Detailed in the HPI GI: tolerating PO intake without difficulty Neuro: No numbness, parasthesias, or tingling associated. Otherwise the pertinent positives of the ROS are noted above.    Physical Examination: Filed Vitals:   01/20/12 1116  BP: 100/70  Pulse: 72  Temp: 98.6 F (37 C)   Filed Vitals:   01/20/12 1116  Height: 5\' 3"  (1.6 m)  Weight: 137 lb 4 oz (62.256 kg)   Body mass index is 24.31 kg/(m^2). Ideal Body Weight: Weight in (lb) to have BMI =  25: 140.8    GEN: Well-developed,well-nourished,in no acute distress; alert,appropriate and cooperative throughout examination HEENT: Normocephalic and atraumatic without obvious abnormalities. Ears, externally no deformities PULM: Breathing comfortably in no respiratory distress EXT: No clubbing, cyanosis, or edema PSYCH: Normally interactive. Cooperative during the interview. Pleasant. Friendly and conversant. Not anxious or depressed appearing. Normal, full affect.  Range of motion at  the waist: Flexion: normal Extension: normal Lateral bending: normal Rotation: all normal  No echymosis or edema Rises to examination table with no difficulty Gait: non antalgic  Inspection/Deformity: N Paraspinus Tenderness: l4-s1  B Ankle Dorsiflexion (L5,4): 5/5 B Great Toe Dorsiflexion (L5,4): 5/5 Heel Walk (L5): WNL Toe Walk (S1): WNL Rise/Squat (L4): WNL  SENSORY B Medial Foot (L4): WNL B Dorsum (L5): WNL B Lateral (S1): WNL Light Touch: WNL Pinprick: WNL  REFLEXES Knee (L4): 2+ Ankle (S1): 2+  B SLR, seated: neg B SLR, supine: neg B FABER: neg B Greater Troch: NT B Log Roll: neg B Stork: NT B Sciatic Notch: TTP   Assessment and Plan:  1. Low back pain  MR Lumbar Spine Wo Contrast, predniSONE (DELTASONE) 20 MG tablet, Ambulatory referral to Pain Clinic   Chronic low back pain with acute on chronic exacerbation and failure to improve despite several months of conservative management. Prednisone burst and taper now. Obtain an MRI of the spine to evaluate for potential disc herniation, foraminal impingement, spinal cord impingement, spinal stenosis, in anticipation of potential intervention.  Orders Today:  Orders Placed This Encounter  Procedures  . MR Lumbar Spine Wo Contrast    Standing Status: Future     Number of Occurrences:      Standing Expiration Date: 03/21/2013    Order Specific Question:  Does the patient have a pacemaker, internal devices, implants, aneury      Answer:  No    Order Specific Question:  Preferred imaging location?    Answer:  External    Order Specific Question:  Reason for exam:    Answer:  [facility], 10 years of back pain, failure conservative management, preprocedural planning  . Ambulatory referral to Pain Clinic    Referral Priority:  Routine    Referral Type:  Consultation    Referral Reason:  Specialty Services Required    Requested Specialty:  Pain Medicine    Number of Visits Requested:  1    Medications Today: (Includes new updates added during medication reconciliation) Meds ordered this encounter  Medications  . NON FORMULARY    Sig: Laxative every third night  . predniSONE (DELTASONE) 20 MG tablet    Sig: 2 tabs po for 5 days, then 1 po for  5 days    Dispense:  15 tablet    Refill:  0    Medications Discontinued: There are no discontinued medications.   Hannah Beat, MD

## 2012-01-27 ENCOUNTER — Ambulatory Visit: Payer: Self-pay | Admitting: Family Medicine

## 2012-01-27 DIAGNOSIS — M4120 Other idiopathic scoliosis, site unspecified: Secondary | ICD-10-CM | POA: Diagnosis not present

## 2012-01-27 DIAGNOSIS — M545 Low back pain, unspecified: Secondary | ICD-10-CM | POA: Diagnosis not present

## 2012-01-27 DIAGNOSIS — M47817 Spondylosis without myelopathy or radiculopathy, lumbosacral region: Secondary | ICD-10-CM | POA: Diagnosis not present

## 2012-01-27 DIAGNOSIS — M5137 Other intervertebral disc degeneration, lumbosacral region: Secondary | ICD-10-CM | POA: Diagnosis not present

## 2012-01-28 ENCOUNTER — Encounter: Payer: Self-pay | Admitting: Family Medicine

## 2012-01-29 ENCOUNTER — Other Ambulatory Visit: Payer: Self-pay | Admitting: Family Medicine

## 2012-01-29 DIAGNOSIS — M5416 Radiculopathy, lumbar region: Secondary | ICD-10-CM

## 2012-01-29 DIAGNOSIS — IMO0002 Reserved for concepts with insufficient information to code with codable children: Secondary | ICD-10-CM

## 2012-02-11 DIAGNOSIS — IMO0002 Reserved for concepts with insufficient information to code with codable children: Secondary | ICD-10-CM | POA: Diagnosis not present

## 2012-02-11 DIAGNOSIS — M47817 Spondylosis without myelopathy or radiculopathy, lumbosacral region: Secondary | ICD-10-CM | POA: Diagnosis not present

## 2012-02-11 DIAGNOSIS — M48062 Spinal stenosis, lumbar region with neurogenic claudication: Secondary | ICD-10-CM | POA: Diagnosis not present

## 2012-02-14 DIAGNOSIS — M48062 Spinal stenosis, lumbar region with neurogenic claudication: Secondary | ICD-10-CM | POA: Diagnosis not present

## 2012-02-14 DIAGNOSIS — M5137 Other intervertebral disc degeneration, lumbosacral region: Secondary | ICD-10-CM | POA: Diagnosis not present

## 2012-02-14 DIAGNOSIS — IMO0002 Reserved for concepts with insufficient information to code with codable children: Secondary | ICD-10-CM | POA: Diagnosis not present

## 2012-02-14 DIAGNOSIS — M47817 Spondylosis without myelopathy or radiculopathy, lumbosacral region: Secondary | ICD-10-CM | POA: Diagnosis not present

## 2012-03-05 DIAGNOSIS — M5137 Other intervertebral disc degeneration, lumbosacral region: Secondary | ICD-10-CM | POA: Diagnosis not present

## 2012-03-05 DIAGNOSIS — M47817 Spondylosis without myelopathy or radiculopathy, lumbosacral region: Secondary | ICD-10-CM | POA: Diagnosis not present

## 2012-03-05 DIAGNOSIS — IMO0002 Reserved for concepts with insufficient information to code with codable children: Secondary | ICD-10-CM | POA: Diagnosis not present

## 2012-03-05 DIAGNOSIS — M48062 Spinal stenosis, lumbar region with neurogenic claudication: Secondary | ICD-10-CM | POA: Diagnosis not present

## 2012-03-11 DIAGNOSIS — Z23 Encounter for immunization: Secondary | ICD-10-CM | POA: Diagnosis not present

## 2012-05-20 DIAGNOSIS — M5137 Other intervertebral disc degeneration, lumbosacral region: Secondary | ICD-10-CM | POA: Diagnosis not present

## 2012-05-20 DIAGNOSIS — IMO0002 Reserved for concepts with insufficient information to code with codable children: Secondary | ICD-10-CM | POA: Diagnosis not present

## 2012-05-20 DIAGNOSIS — M48062 Spinal stenosis, lumbar region with neurogenic claudication: Secondary | ICD-10-CM | POA: Diagnosis not present

## 2012-05-20 DIAGNOSIS — M47817 Spondylosis without myelopathy or radiculopathy, lumbosacral region: Secondary | ICD-10-CM | POA: Diagnosis not present

## 2012-06-16 DIAGNOSIS — IMO0002 Reserved for concepts with insufficient information to code with codable children: Secondary | ICD-10-CM | POA: Diagnosis not present

## 2012-06-16 DIAGNOSIS — M5137 Other intervertebral disc degeneration, lumbosacral region: Secondary | ICD-10-CM | POA: Diagnosis not present

## 2012-06-16 DIAGNOSIS — M47817 Spondylosis without myelopathy or radiculopathy, lumbosacral region: Secondary | ICD-10-CM | POA: Diagnosis not present

## 2012-06-16 DIAGNOSIS — M48062 Spinal stenosis, lumbar region with neurogenic claudication: Secondary | ICD-10-CM | POA: Diagnosis not present

## 2012-08-11 ENCOUNTER — Other Ambulatory Visit: Payer: Self-pay

## 2012-08-11 NOTE — Telephone Encounter (Signed)
Pt request written rx for levothyroxine and mirtazapine to send to new mail order pharmacy. Call pt when rx ready for pick up. Pt said could not wait until next week to mail rx and would like to pick up rx on 08/12/12.Marland KitchenPlease advise.

## 2012-08-12 MED ORDER — MIRTAZAPINE 30 MG PO TABS: 30.0000 mg | ORAL_TABLET | Freq: Every day | ORAL | Status: DC

## 2012-08-12 MED ORDER — LEVOTHYROXINE SODIUM 75 MCG PO TABS: 75.0000 ug | ORAL_TABLET | Freq: Every day | ORAL | Status: DC

## 2012-08-12 NOTE — Telephone Encounter (Signed)
Patient advised.

## 2012-09-22 DIAGNOSIS — M2040 Other hammer toe(s) (acquired), unspecified foot: Secondary | ICD-10-CM | POA: Diagnosis not present

## 2012-09-22 DIAGNOSIS — M76829 Posterior tibial tendinitis, unspecified leg: Secondary | ICD-10-CM | POA: Diagnosis not present

## 2012-11-30 ENCOUNTER — Encounter: Payer: Self-pay | Admitting: Internal Medicine

## 2012-11-30 ENCOUNTER — Ambulatory Visit (INDEPENDENT_AMBULATORY_CARE_PROVIDER_SITE_OTHER): Payer: Medicare Other | Admitting: Internal Medicine

## 2012-11-30 VITALS — BP 110/60 | HR 74 | Temp 97.9°F | Ht 63.5 in | Wt 133.0 lb

## 2012-11-30 DIAGNOSIS — K5909 Other constipation: Secondary | ICD-10-CM

## 2012-11-30 DIAGNOSIS — E785 Hyperlipidemia, unspecified: Secondary | ICD-10-CM | POA: Diagnosis not present

## 2012-11-30 DIAGNOSIS — Z0000 Encounter for general adult medical examination without abnormal findings: Secondary | ICD-10-CM

## 2012-11-30 DIAGNOSIS — G479 Sleep disorder, unspecified: Secondary | ICD-10-CM

## 2012-11-30 DIAGNOSIS — E039 Hypothyroidism, unspecified: Secondary | ICD-10-CM | POA: Diagnosis not present

## 2012-11-30 DIAGNOSIS — M545 Low back pain: Secondary | ICD-10-CM

## 2012-11-30 NOTE — Patient Instructions (Addendum)
Please try miralax daily and senna-S 2 tabs once or twice a day, to see if that works better for your bowels. You can still use MOM or dulcolax on top of those every 3 days if necessary. You are due for your mammogram 12/01/13.

## 2012-11-30 NOTE — Progress Notes (Signed)
Subjective:    Patient ID: Robin Logan, female    DOB: 07-16-1935, 77 y.o.   MRN: [id number]  HPI Here for Medicare wellness Former smoker Drinks 1 drink per day Regular exercise Reviewed her other physicians No memory issues No falls No depression or anhedonia Independent with ADLs and instrumental ADLs (has help due to back problems) Vision and hearing are fine Really likes living at [facility]  Concerned about laxative dependence Felt that the miralax didn't work Takes dulcolax/MOM every 3 days--then later in day will have a lot of liquid stool  Seeing Dr Yves Dill for the chronic back pain Has had epidural injections and prednisone Some better now Feels yoga has helped quite a bit No zumba anymore--too hard on her back  Lifetime member of [employer] Had gone up to 141# but now back to her goal weight  Sleeps okay with the mirtazapine Uses ibuprofen some nights also--this seems to help also  Still on the thyroid med No change in nails or skin Feels she is losing hair-- she can see more bare spots  Current Outpatient Prescriptions on File Prior to Visit  Medication Sig Dispense Refill  . cholecalciferol (VITAMIN D) 1000 UNITS tablet Take 1,000 Units by mouth daily.        Marland Kitchen levothyroxine (SYNTHROID, LEVOTHROID) 75 MCG tablet Take 1 tablet (75 mcg total) by mouth daily.  90 tablet  3  . magnesium hydroxide (MILK OF MAGNESIA) 400 MG/5ML suspension Take by mouth daily as needed.        . mirtazapine (REMERON) 30 MG tablet Take 1 tablet (30 mg total) by mouth at bedtime.  90 tablet  3  . Multiple Vitamins-Minerals (VISION PLUS PO) Take by mouth daily.         No current facility-administered medications on file prior to visit.    No Known Allergies  Past Medical History  Diagnosis Date  . Depression   . Hyperlipidemia   . Thyroid disease   . Arthritis   . Osteopenia   . Sleep disorder     Past Surgical History  Procedure Laterality Date  .  Appendectomy    . Abdominal hysterectomy    . Tonsillectomy and adenoidectomy  1946    Family History  Problem Relation Age of Onset  . Hypertension Father   . Stroke Brother   . Diabetes Brother   . Cancer Neg Hx     no breast or colon    History   Social History  . Marital Status: Married    Spouse Name: N/A    Number of Children: N/A  . Years of Education: N/A   Occupational History  . 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    Social History Main Topics  . Smoking status: Former Smoker    Quit date: 06/03/1978  . Smokeless tobacco: Never Used  . Alcohol Use: Yes     Comment: wine  . Drug Use: No  . Sexually Active: Not on file   Other Topics Concern  . Not on file   Social History Narrative   Requests DNR --order done 12/24/10   Has living will   Husband is health care POA   No tube feedings if cognitively unaware   Review of Systems Voids okay---occ delay in micturition after the urge No rashes No cough or SOB No chest pain Stamina is good     Objective:   Physical Exam  Constitutional: She is oriented to person, place, and time.  She appears well-developed and well-nourished. No distress.  HENT:  Mouth/Throat: Oropharynx is clear and moist. No oropharyngeal exudate.  Neck: Normal range of motion. Neck supple. No thyromegaly present.  Cardiovascular: Normal rate, regular rhythm, normal heart sounds and intact distal pulses.  Exam reveals no gallop.   No murmur heard. Pulmonary/Chest: Effort normal and breath sounds normal. No respiratory distress. She has no wheezes. She has no rales.  Abdominal: Soft. There is no tenderness.  Musculoskeletal: She exhibits no edema and no tenderness.  Lymphadenopathy:    She has no cervical adenopathy.  Neurological: She is alert and oriented to person, place, and time.  President -- "Obama, Bush, Clinton" [phone] [id number] Recall 2/3  Skin: Skin is warm. No rash noted. No erythema.   Psychiatric: She has a normal mood and affect. Her behavior is normal.          Assessment & Plan:

## 2012-11-30 NOTE — Assessment & Plan Note (Signed)
Discussed primary prevention Strong family history She prefers no meds unless very high (??LDL over 160) Had been on just simvastatin 20

## 2012-11-30 NOTE — Assessment & Plan Note (Signed)
Some hair loss Due for labs

## 2012-11-30 NOTE — Assessment & Plan Note (Signed)
Better with ESI and yoga

## 2012-11-30 NOTE — Assessment & Plan Note (Signed)
Does okay with the mirtazapine No wean indicated

## 2012-11-30 NOTE — Assessment & Plan Note (Signed)
Discussed a bowel regimen

## 2012-11-30 NOTE — Assessment & Plan Note (Signed)
I have personally reviewed the Medicare Annual Wellness questionnaire and have noted 1. The patient's medical and social history 2. Their use of alcohol, tobacco or illicit drugs 3. Their current medications and supplements 4. The patient's functional ability including ADL's, fall risks, home safety risks and hearing or visual             impairment. 5. Diet and physical activities 6. Evidence for depression or mood disorders  The patients weight, height, BMI and visual acuity have been recorded in the chart I have made referrals, counseling and provided education to the patient based review of the above and I have provided the pt with a written personalized care plan for preventive services.  I have provided you with a copy of your personalized plan for preventive services. Please take the time to review along with your updated medication list.  Due for mammo next year UTD otherwise

## 2012-12-01 LAB — BASIC METABOLIC PANEL
BUN: 22 mg/dL (ref 6–23)
Chloride: 102 mEq/L (ref 96–112)
Potassium: 4 mEq/L (ref 3.5–5.1)
Sodium: 137 mEq/L (ref 135–145)

## 2012-12-01 LAB — LIPID PANEL
Cholesterol: 266 mg/dL — ABNORMAL HIGH (ref 0–200)
Total CHOL/HDL Ratio: 3

## 2012-12-01 LAB — LDL CHOLESTEROL, DIRECT: Direct LDL: 161.5 mg/dL

## 2012-12-01 LAB — CBC WITH DIFFERENTIAL/PLATELET
Basophils Relative: 0.5 % (ref 0.0–3.0)
Eosinophils Relative: 4.1 % (ref 0.0–5.0)
HCT: 36 % (ref 36.0–46.0)
Hemoglobin: 12.1 g/dL (ref 12.0–15.0)
Lymphs Abs: 1.6 10*3/uL (ref 0.7–4.0)
MCV: 94.9 fl (ref 78.0–100.0)
Monocytes Absolute: 0.2 10*3/uL (ref 0.1–1.0)
Neutro Abs: 4.7 10*3/uL (ref 1.4–7.7)
Platelets: 193 10*3/uL (ref 150.0–400.0)
RBC: 3.8 Mil/uL — ABNORMAL LOW (ref 3.87–5.11)
WBC: 6.8 10*3/uL (ref 4.5–10.5)

## 2012-12-01 LAB — HEPATIC FUNCTION PANEL
ALT: 20 U/L (ref 0–35)
AST: 27 U/L (ref 0–37)
Albumin: 4.2 g/dL (ref 3.5–5.2)
Alkaline Phosphatase: 59 U/L (ref 39–117)

## 2013-03-11 DIAGNOSIS — Z23 Encounter for immunization: Secondary | ICD-10-CM | POA: Diagnosis not present

## 2013-03-17 DIAGNOSIS — IMO0002 Reserved for concepts with insufficient information to code with codable children: Secondary | ICD-10-CM | POA: Diagnosis not present

## 2013-03-17 DIAGNOSIS — M5137 Other intervertebral disc degeneration, lumbosacral region: Secondary | ICD-10-CM | POA: Diagnosis not present

## 2013-03-17 DIAGNOSIS — M48062 Spinal stenosis, lumbar region with neurogenic claudication: Secondary | ICD-10-CM | POA: Diagnosis not present

## 2013-04-08 ENCOUNTER — Other Ambulatory Visit: Payer: Self-pay

## 2013-07-04 ENCOUNTER — Other Ambulatory Visit: Payer: Self-pay | Admitting: Family Medicine

## 2013-07-27 DIAGNOSIS — H5210 Myopia, unspecified eye: Secondary | ICD-10-CM | POA: Diagnosis not present

## 2013-08-04 ENCOUNTER — Encounter: Payer: Self-pay | Admitting: Internal Medicine

## 2013-08-04 ENCOUNTER — Ambulatory Visit (INDEPENDENT_AMBULATORY_CARE_PROVIDER_SITE_OTHER): Payer: Medicare Other | Admitting: Internal Medicine

## 2013-08-04 VITALS — BP 100/60 | HR 84 | Temp 97.7°F | Wt 131.0 lb

## 2013-08-04 DIAGNOSIS — R3915 Urgency of urination: Secondary | ICD-10-CM | POA: Diagnosis not present

## 2013-08-04 LAB — POCT URINALYSIS DIPSTICK
Bilirubin, UA: NEGATIVE
Glucose, UA: NEGATIVE
KETONES UA: NEGATIVE
NITRITE UA: NEGATIVE
PH UA: 6.5
Spec Grav, UA: 1.015
Urobilinogen, UA: NEGATIVE

## 2013-08-04 NOTE — Assessment & Plan Note (Addendum)
Goes back nearly a year Urinalysis shows just a trace of hemolyzed blood and leukocytes History suggests bladder dysfunction likely related to chronic constipation  Will send culture to be sure Referral to urology for bladder eval---may not need action if no sig residuals, etc

## 2013-08-04 NOTE — Progress Notes (Signed)
Pre visit review using our clinic review tool, if applicable. No additional management support is needed unless otherwise documented below in the visit note. 

## 2013-08-04 NOTE — Addendum Note (Signed)
Addended by: Despina Hidden on: 08/04/2013 11:17 AM   Modules accepted: Orders

## 2013-08-04 NOTE — Progress Notes (Signed)
   Subjective:    Patient ID: Robin Logan, female    DOB: 10/25/1935, 78 y.o.   MRN: [id number]  HPI Feels that she is not emptying bladder Gets urgency and then just sits there--takes a while and then she has to push No dysuria or hematuria Seems to void easier at night Goes back for many months  Laxative dependent Currently using senna daily--and goes every 3 days (feels that she empties well then)  Current Outpatient Prescriptions on File Prior to Visit  Medication Sig Dispense Refill  . cholecalciferol (VITAMIN D) 1000 UNITS tablet Take 1,000 Units by mouth daily.        Marland Kitchen levothyroxine (SYNTHROID, LEVOTHROID) 75 MCG tablet TAKE 1 TABLET DAILY  90 tablet  1  . mirtazapine (REMERON) 30 MG tablet TAKE 1 TABLET AT BEDTIME  90 tablet  1  . Multiple Vitamins-Minerals (VISION PLUS PO) Take by mouth daily.         No current facility-administered medications on file prior to visit.    No Known Allergies  Past Medical History  Diagnosis Date  . Depression   . Hyperlipidemia   . Thyroid disease   . Arthritis   . Osteopenia   . Sleep disorder     Past Surgical History  Procedure Laterality Date  . Appendectomy    . Abdominal hysterectomy    . Tonsillectomy and adenoidectomy  1946    Family History  Problem Relation Age of Onset  . Hypertension Father   . Stroke Brother   . Diabetes Brother   . Cancer Neg Hx     no breast or colon    History   Social History  . Marital Status: Married    Spouse Name: N/A    Number of Children: N/A  . Years of Education: N/A   Occupational History  . 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    Social History Main Topics  . Smoking status: Former Smoker    Quit date: 06/03/1978  . Smokeless tobacco: Never Used  . Alcohol Use: Yes     Comment: wine  . Drug Use: No  . Sexual Activity: Not on file   Other Topics Concern  . Not on file   Social History Narrative   Requests DNR --order done 12/24/10   Has living  will   Husband is health care POA   No tube feedings if cognitively unaware   Review of Systems No fever No abdominal pain No N/V Appetite is good    Objective:   Physical Exam  Constitutional: She appears well-developed and well-nourished. No distress.  Abdominal: Soft. Bowel sounds are normal. She exhibits no distension and no mass. There is no tenderness. There is no rebound and no guarding.  No suprapubic dullness  Musculoskeletal:  No CVA tenderness          Assessment & Plan:

## 2013-08-05 LAB — URINE CULTURE: Colony Count: 9000

## 2013-08-24 DIAGNOSIS — R339 Retention of urine, unspecified: Secondary | ICD-10-CM | POA: Diagnosis not present

## 2013-08-24 DIAGNOSIS — R3911 Hesitancy of micturition: Secondary | ICD-10-CM | POA: Diagnosis not present

## 2013-08-24 DIAGNOSIS — N952 Postmenopausal atrophic vaginitis: Secondary | ICD-10-CM | POA: Diagnosis not present

## 2013-09-21 DIAGNOSIS — R3911 Hesitancy of micturition: Secondary | ICD-10-CM | POA: Diagnosis not present

## 2013-09-21 DIAGNOSIS — R339 Retention of urine, unspecified: Secondary | ICD-10-CM | POA: Diagnosis not present

## 2013-09-28 DIAGNOSIS — IMO0002 Reserved for concepts with insufficient information to code with codable children: Secondary | ICD-10-CM | POA: Diagnosis not present

## 2013-10-19 DIAGNOSIS — R3989 Other symptoms and signs involving the genitourinary system: Secondary | ICD-10-CM | POA: Diagnosis not present

## 2013-10-19 DIAGNOSIS — M62838 Other muscle spasm: Secondary | ICD-10-CM | POA: Diagnosis not present

## 2013-10-19 DIAGNOSIS — R279 Unspecified lack of coordination: Secondary | ICD-10-CM | POA: Diagnosis not present

## 2013-10-19 DIAGNOSIS — M6281 Muscle weakness (generalized): Secondary | ICD-10-CM | POA: Diagnosis not present

## 2013-11-02 DIAGNOSIS — M6281 Muscle weakness (generalized): Secondary | ICD-10-CM | POA: Diagnosis not present

## 2013-11-02 DIAGNOSIS — M62838 Other muscle spasm: Secondary | ICD-10-CM | POA: Diagnosis not present

## 2013-11-02 DIAGNOSIS — R3989 Other symptoms and signs involving the genitourinary system: Secondary | ICD-10-CM | POA: Diagnosis not present

## 2013-11-02 DIAGNOSIS — R279 Unspecified lack of coordination: Secondary | ICD-10-CM | POA: Diagnosis not present

## 2013-11-16 DIAGNOSIS — M6281 Muscle weakness (generalized): Secondary | ICD-10-CM | POA: Diagnosis not present

## 2013-11-16 DIAGNOSIS — M62838 Other muscle spasm: Secondary | ICD-10-CM | POA: Diagnosis not present

## 2013-11-16 DIAGNOSIS — R3989 Other symptoms and signs involving the genitourinary system: Secondary | ICD-10-CM | POA: Diagnosis not present

## 2013-11-16 DIAGNOSIS — R279 Unspecified lack of coordination: Secondary | ICD-10-CM | POA: Diagnosis not present

## 2013-11-22 LAB — LIPID PANEL
Cholesterol: 233 mg/dL — AB (ref 0–200)
HDL: 95 mg/dL — AB (ref 35–70)
LDL CALC: 117 mg/dL

## 2013-11-22 LAB — BASIC METABOLIC PANEL: GLUCOSE: 68 mg/dL

## 2013-11-22 LAB — HM DEXA SCAN

## 2013-11-23 DIAGNOSIS — R3989 Other symptoms and signs involving the genitourinary system: Secondary | ICD-10-CM | POA: Diagnosis not present

## 2013-11-23 DIAGNOSIS — L821 Other seborrheic keratosis: Secondary | ICD-10-CM | POA: Diagnosis not present

## 2013-11-23 DIAGNOSIS — R279 Unspecified lack of coordination: Secondary | ICD-10-CM | POA: Diagnosis not present

## 2013-11-23 DIAGNOSIS — D1800 Hemangioma unspecified site: Secondary | ICD-10-CM | POA: Diagnosis not present

## 2013-11-23 DIAGNOSIS — L819 Disorder of pigmentation, unspecified: Secondary | ICD-10-CM | POA: Diagnosis not present

## 2013-11-23 DIAGNOSIS — L820 Inflamed seborrheic keratosis: Secondary | ICD-10-CM | POA: Diagnosis not present

## 2013-11-23 DIAGNOSIS — M62838 Other muscle spasm: Secondary | ICD-10-CM | POA: Diagnosis not present

## 2013-11-23 DIAGNOSIS — M6281 Muscle weakness (generalized): Secondary | ICD-10-CM | POA: Diagnosis not present

## 2013-12-07 ENCOUNTER — Ambulatory Visit (INDEPENDENT_AMBULATORY_CARE_PROVIDER_SITE_OTHER): Payer: Medicare Other | Admitting: Internal Medicine

## 2013-12-07 ENCOUNTER — Encounter: Payer: Self-pay | Admitting: Internal Medicine

## 2013-12-07 VITALS — BP 110/70 | HR 70 | Temp 97.7°F | Ht 63.0 in | Wt 131.0 lb

## 2013-12-07 DIAGNOSIS — R279 Unspecified lack of coordination: Secondary | ICD-10-CM | POA: Diagnosis not present

## 2013-12-07 DIAGNOSIS — M899 Disorder of bone, unspecified: Secondary | ICD-10-CM

## 2013-12-07 DIAGNOSIS — M949 Disorder of cartilage, unspecified: Secondary | ICD-10-CM

## 2013-12-07 DIAGNOSIS — G479 Sleep disorder, unspecified: Secondary | ICD-10-CM

## 2013-12-07 DIAGNOSIS — R3915 Urgency of urination: Secondary | ICD-10-CM

## 2013-12-07 DIAGNOSIS — M62838 Other muscle spasm: Secondary | ICD-10-CM | POA: Diagnosis not present

## 2013-12-07 DIAGNOSIS — Z0000 Encounter for general adult medical examination without abnormal findings: Secondary | ICD-10-CM

## 2013-12-07 DIAGNOSIS — K5909 Other constipation: Secondary | ICD-10-CM

## 2013-12-07 DIAGNOSIS — E039 Hypothyroidism, unspecified: Secondary | ICD-10-CM | POA: Diagnosis not present

## 2013-12-07 DIAGNOSIS — E785 Hyperlipidemia, unspecified: Secondary | ICD-10-CM

## 2013-12-07 DIAGNOSIS — M8580 Other specified disorders of bone density and structure, unspecified site: Secondary | ICD-10-CM | POA: Insufficient documentation

## 2013-12-07 DIAGNOSIS — Z7189 Other specified counseling: Secondary | ICD-10-CM | POA: Diagnosis not present

## 2013-12-07 DIAGNOSIS — R3989 Other symptoms and signs involving the genitourinary system: Secondary | ICD-10-CM | POA: Diagnosis not present

## 2013-12-07 DIAGNOSIS — Z23 Encounter for immunization: Secondary | ICD-10-CM

## 2013-12-07 DIAGNOSIS — M6281 Muscle weakness (generalized): Secondary | ICD-10-CM | POA: Diagnosis not present

## 2013-12-07 LAB — TSH: TSH: 5.22 u[IU]/mL — ABNORMAL HIGH (ref 0.35–4.50)

## 2013-12-07 LAB — T4, FREE: FREE T4: 0.81 ng/dL (ref 0.60–1.60)

## 2013-12-07 NOTE — Assessment & Plan Note (Signed)
Satisfied with the med Will continue

## 2013-12-07 NOTE — Assessment & Plan Note (Signed)
Regular exercise and vitamin D

## 2013-12-07 NOTE — Assessment & Plan Note (Signed)
Has DNR See social history 

## 2013-12-07 NOTE — Addendum Note (Signed)
Addended by: Despina Hidden on: 12/07/2013 10:51 AM   Modules accepted: Orders

## 2013-12-07 NOTE — Assessment & Plan Note (Signed)
No worrisome findings on evaluation She is some better with exercises, etc

## 2013-12-07 NOTE — Assessment & Plan Note (Signed)
Seems euthyroid Due for labs 

## 2013-12-07 NOTE — Assessment & Plan Note (Signed)
Low risk profile recently at [place]

## 2013-12-07 NOTE — Progress Notes (Signed)
Pre visit review using our clinic review tool, if applicable. No additional management support is needed unless otherwise documented below in the visit note. 

## 2013-12-07 NOTE — Patient Instructions (Signed)
Please set up your last screening mammogram.

## 2013-12-07 NOTE — Assessment & Plan Note (Signed)
Happy with regimen

## 2013-12-07 NOTE — Progress Notes (Signed)
Subjective:    Patient ID: Robin Logan, female    DOB: 1936-04-19, 78 y.o.   MRN: [id number]  HPI Here for Medicare wellness and follow up Reviewed form and advanced directives 1 drink per day at most-- usually about once a week.  No tobacco Reviewed other physicians she sees No vision or sig hearing issues No falls No depression or anhedonia. Independent in all instrumental ADLs No cognitive issues  Ongoing bladder problems Not helped by mybetriq Now going to OT there--not sure whether it is helping much Does empty adequately  Did have bone density test Only osteopenia On vitamin D Regular physical exercise  Sleeps well with the mirtazapine awakens refreshed Normal energy levels during day  Still sees Dr Sharlet Salina as needed Back has been better lately  Senna is helping her bowels more Less harsh then her other regimen Only occasionally needs the dulcolax  Doing well on the thyroid Weight fine No skin or hair changes  Current Outpatient Prescriptions on File Prior to Visit  Medication Sig Dispense Refill  . cholecalciferol (VITAMIN D) 1000 UNITS tablet Take 1,000 Units by mouth daily.        Marland Kitchen levothyroxine (SYNTHROID, LEVOTHROID) 75 MCG tablet TAKE 1 TABLET DAILY  90 tablet  1  . mirtazapine (REMERON) 30 MG tablet TAKE 1 TABLET AT BEDTIME  90 tablet  1  . Multiple Vitamins-Minerals (VISION PLUS PO) Take by mouth daily.        Marland Kitchen senna-docusate (SENOKOT-S) 8.6-50 MG per tablet Take 2-4 tablets by mouth 2 (two) times daily.       No current facility-administered medications on file prior to visit.    No Known Allergies  Past Medical History  Diagnosis Date  . Depression   . Hyperlipidemia   . Thyroid disease   . Arthritis   . Osteopenia   . Sleep disorder   . Osteopenia     Past Surgical History  Procedure Laterality Date  . Appendectomy    . Abdominal hysterectomy    . Tonsillectomy and adenoidectomy  1946    Family History  Problem Relation  Age of Onset  . Hypertension Father   . Stroke Brother   . Diabetes Brother   . Cancer Neg Hx     no breast or colon    History   Social History  . Marital Status: Married    Spouse Name: N/A    Number of Children: N/A  . Years of Education: N/A   Occupational History  . 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    Social History Main Topics  . Smoking status: Former Smoker    Quit date: 06/03/1978  . Smokeless tobacco: Never Used  . Alcohol Use: Yes     Comment: wine  . Drug Use: No  . Sexual Activity: Not on file   Other Topics Concern  . Not on file   Social History Narrative   Requests DNR --order done 12/24/10   Has living will   Husband is health care POA   No tube feedings if cognitively unaware   Review of Systems No other arthritis problems No heartburn Appetite is good Still does Weight Watchers---weight still stable Recent derm visit--has a few places frozen    Objective:   Physical Exam  Constitutional: She is oriented to person, place, and time. She appears well-developed and well-nourished. No distress.  HENT:  Mouth/Throat: Oropharynx is clear and moist. No oropharyngeal exudate.  Neck: Normal range of motion.  Neck supple. No thyromegaly present.  Cardiovascular: Normal rate, regular rhythm, normal heart sounds and intact distal pulses.  Exam reveals no gallop.   No murmur heard. Pulmonary/Chest: Effort normal and breath sounds normal. No respiratory distress. She has no wheezes. She has no rales.  Abdominal: Soft. There is no tenderness.  Musculoskeletal: She exhibits no edema and no tenderness.  Lymphadenopathy:    She has no cervical adenopathy.  Neurological: She is alert and oriented to person, place, and time.  President-- "Obama, Bush, [place]" [phone] D-l-r-o-w Recall 3/3  Skin: No rash noted. No erythema.  Psychiatric: She has a normal mood and affect. Her behavior is normal.          Assessment & Plan:

## 2013-12-07 NOTE — Assessment & Plan Note (Signed)
I have personally reviewed the Medicare Annual Wellness questionnaire and have noted 1. The patient's medical and social history 2. Their use of alcohol, tobacco or illicit drugs 3. Their current medications and supplements 4. The patient's functional ability including ADL's, fall risks, home safety risks and hearing or visual             impairment. 5. Diet and physical activities 6. Evidence for depression or mood disorders  The patients weight, height, BMI and visual acuity have been recorded in the chart I have made referrals, counseling and provided education to the patient based review of the above and I have provided the pt with a written personalized care plan for preventive services.  I have provided you with a copy of your personalized plan for preventive services. Please take the time to review along with your updated medication list.  No breast exam after discussion-she will get one more mammogram Will discuss colonoscopy next year--not sure prevnar today Yearly flu shot

## 2013-12-09 ENCOUNTER — Other Ambulatory Visit: Payer: Self-pay | Admitting: Internal Medicine

## 2014-01-06 ENCOUNTER — Ambulatory Visit: Payer: Self-pay | Admitting: Internal Medicine

## 2014-01-06 ENCOUNTER — Encounter: Payer: Self-pay | Admitting: Internal Medicine

## 2014-01-06 DIAGNOSIS — Z1231 Encounter for screening mammogram for malignant neoplasm of breast: Secondary | ICD-10-CM | POA: Diagnosis not present

## 2014-03-24 DIAGNOSIS — Z23 Encounter for immunization: Secondary | ICD-10-CM | POA: Diagnosis not present

## 2014-08-31 ENCOUNTER — Emergency Department: Payer: Self-pay | Admitting: Emergency Medicine

## 2014-08-31 DIAGNOSIS — Z87891 Personal history of nicotine dependence: Secondary | ICD-10-CM | POA: Diagnosis not present

## 2014-08-31 DIAGNOSIS — R509 Fever, unspecified: Secondary | ICD-10-CM | POA: Diagnosis not present

## 2014-08-31 DIAGNOSIS — R05 Cough: Secondary | ICD-10-CM | POA: Diagnosis not present

## 2014-08-31 DIAGNOSIS — J189 Pneumonia, unspecified organism: Secondary | ICD-10-CM | POA: Diagnosis not present

## 2014-09-07 ENCOUNTER — Ambulatory Visit (INDEPENDENT_AMBULATORY_CARE_PROVIDER_SITE_OTHER): Payer: Medicare Other | Admitting: Internal Medicine

## 2014-09-07 ENCOUNTER — Encounter: Payer: Self-pay | Admitting: Internal Medicine

## 2014-09-07 VITALS — BP 110/68 | HR 69 | Temp 98.3°F | Resp 16 | Wt 132.0 lb

## 2014-09-07 DIAGNOSIS — J189 Pneumonia, unspecified organism: Secondary | ICD-10-CM

## 2014-09-07 DIAGNOSIS — J181 Lobar pneumonia, unspecified organism: Principal | ICD-10-CM

## 2014-09-07 DIAGNOSIS — G479 Sleep disorder, unspecified: Secondary | ICD-10-CM

## 2014-09-07 NOTE — Progress Notes (Signed)
   Subjective:    Patient ID: Robin Logan, female    DOB: 08/19/1935, 79 y.o.   MRN: [id number]  HPI Here for ER follow up--- LLL pneumonia CXR equivocal though---could be atelectasis  Bad cough and hearing something in her chest Couldn't sleep Actually fell in bathroom and she felt bad so they went to the ER  ?fever at home No sweats or chills Some sense of SOB  7 days of levofloxacin Still feels somewhat weak Energy level is still low  Current Outpatient Prescriptions on File Prior to Visit  Medication Sig Dispense Refill  . bisacodyl (BISACODYL) 5 MG EC tablet Take 5 mg by mouth daily as needed for moderate constipation.    . cholecalciferol (VITAMIN D) 1000 UNITS tablet Take 1,000 Units by mouth daily.      Marland Kitchen levothyroxine (SYNTHROID, LEVOTHROID) 75 MCG tablet TAKE 1 TABLET DAILY 90 tablet 3  . mirtazapine (REMERON) 30 MG tablet TAKE 1 TABLET AT BEDTIME 90 tablet 3  . senna-docusate (SENOKOT-S) 8.6-50 MG per tablet Take 2-4 tablets by mouth 2 (two) times daily.     No current facility-administered medications on file prior to visit.    No Known Allergies  Past Medical History  Diagnosis Date  . Depression   . Hyperlipidemia   . Thyroid disease   . Arthritis   . Osteopenia   . Sleep disorder   . Osteopenia     Past Surgical History  Procedure Laterality Date  . Appendectomy    . Abdominal hysterectomy    . Tonsillectomy and adenoidectomy  1946    Family History  Problem Relation Age of Onset  . Hypertension Father   . Stroke Brother   . Diabetes Brother   . Cancer Neg Hx     no breast or colon    History   Social History  . Marital Status: Married    Spouse Name: N/A  . Number of Children: N/A  . Years of Education: N/A   Occupational History  . 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    Social History Main Topics  . Smoking status: Former Smoker    Quit date: 06/03/1978  . Smokeless tobacco: Never Used  . Alcohol Use: Yes   Comment: wine  . Drug Use: No  . Sexual Activity: Not on file   Other Topics Concern  . Not on file   Social History Narrative   Requests DNR --order done 12/24/10   Has living will   Husband is health care POA   No tube feedings if cognitively unaware   Review of Systems Appetite is off--still eating though No diarrhea or vomiting No rash Has RLS symptoms---takes ibuprofen 600mg  at bedtime and that helps as needed    Objective:   Physical Exam  Constitutional: She appears well-developed and well-nourished. No distress.  HENT:  Mouth/Throat: Oropharynx is clear and moist. No oropharyngeal exudate.  Neck: Normal range of motion. Neck supple. No thyromegaly present.  Pulmonary/Chest: Effort normal and breath sounds normal. No respiratory distress. She has no wheezes. She has no rales.  No dullness  Lymphadenopathy:    She has no cervical adenopathy.          Assessment & Plan:

## 2014-09-07 NOTE — Progress Notes (Signed)
Pre visit review using our clinic review tool, if applicable. No additional management support is needed unless otherwise documented below in the visit note. 

## 2014-09-07 NOTE — Assessment & Plan Note (Signed)
Generally does okay with mirtazapine Describes RLS type symptoms---ibuprofen prn helps when this occurs

## 2014-09-07 NOTE — Assessment & Plan Note (Signed)
Clinically better Discussed slow resumption of her normal activities ER records reviewed Will set up repeat CXR in 3 weeks

## 2014-09-29 ENCOUNTER — Ambulatory Visit: Payer: Medicare Other | Admitting: Internal Medicine

## 2014-09-30 ENCOUNTER — Encounter: Payer: Self-pay | Admitting: Internal Medicine

## 2014-09-30 ENCOUNTER — Ambulatory Visit (INDEPENDENT_AMBULATORY_CARE_PROVIDER_SITE_OTHER)
Admission: RE | Admit: 2014-09-30 | Discharge: 2014-09-30 | Disposition: A | Discharge status: Home / Self Care | Payer: Medicare Other | Source: Ambulatory Visit | Attending: Internal Medicine | Admitting: Internal Medicine

## 2014-09-30 ENCOUNTER — Ambulatory Visit (INDEPENDENT_AMBULATORY_CARE_PROVIDER_SITE_OTHER): Payer: Medicare Other | Admitting: Internal Medicine

## 2014-09-30 VITALS — BP 108/70 | HR 64 | Temp 98.1°F | Wt 132.0 lb

## 2014-09-30 DIAGNOSIS — J189 Pneumonia, unspecified organism: Secondary | ICD-10-CM

## 2014-09-30 DIAGNOSIS — J449 Chronic obstructive pulmonary disease, unspecified: Secondary | ICD-10-CM | POA: Diagnosis not present

## 2014-09-30 DIAGNOSIS — J181 Lobar pneumonia, unspecified organism: Principal | ICD-10-CM

## 2014-09-30 NOTE — Progress Notes (Signed)
   Subjective:    Patient ID: Robin Logan, female    DOB: [birth year], 79 y.o.   MRN: [id number]  HPI Here for follow up of pneumonia She feels back to normal  No further cough No fever No SOB Energy levels are fine Back to her exercise---plans to restart the treadmill now  Current Outpatient Prescriptions on File Prior to Visit  Medication Sig Dispense Refill  . bisacodyl (BISACODYL) 5 MG EC tablet Take 5 mg by mouth daily as needed for moderate constipation.    . cholecalciferol (VITAMIN D) 1000 UNITS tablet Take 1,000 Units by mouth daily.      Marland Kitchen ibuprofen (ADVIL,MOTRIN) 200 MG tablet Take 600 mg by mouth at bedtime as needed. For restless legs symptoms    . levothyroxine (SYNTHROID, LEVOTHROID) 75 MCG tablet TAKE 1 TABLET DAILY 90 tablet 3  . mirtazapine (REMERON) 30 MG tablet TAKE 1 TABLET AT BEDTIME 90 tablet 3  . Multiple Vitamins-Minerals (ICAPS) TABS Take by mouth daily.    Marland Kitchen senna-docusate (SENOKOT-S) 8.6-50 MG per tablet Take 2-4 tablets by mouth 2 (two) times daily.     No current facility-administered medications on file prior to visit.    No Known Allergies  Past Medical History  Diagnosis Date  . Depression   . Hyperlipidemia   . Thyroid disease   . Arthritis   . Osteopenia   . Sleep disorder   . Osteopenia     Past Surgical History  Procedure Laterality Date  . Appendectomy    . Abdominal hysterectomy    . Tonsillectomy and adenoidectomy  1946    Family History  Problem Relation Age of Onset  . Hypertension Father   . Stroke Brother   . Diabetes Brother   . Cancer Neg Hx     no breast or colon    History   Social History  . Marital Status: Married    Spouse Name: N/A  . Number of Children: N/A  . Years of Education: N/A   Occupational History  . 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    Social History Main Topics  . Smoking status: Former Smoker    Quit date: 06/03/1978  . Smokeless tobacco: Never Used  . Alcohol Use:  Yes     Comment: wine  . Drug Use: No  . Sexual Activity: Not on file   Other Topics Concern  . Not on file   Social History Narrative   Requests DNR --order done 12/24/10   Has living will   Husband is health care POA   No tube feedings if cognitively unaware   Review of Systems Appetite is good Chronic sleep issues that are stable    Objective:   Physical Exam  Constitutional: She appears well-developed. No distress.  Neck: Normal range of motion. Neck supple.  Cardiovascular: Normal rate, regular rhythm and normal heart sounds.  Exam reveals no gallop.   No murmur heard. Pulmonary/Chest: Effort normal and breath sounds normal. No respiratory distress. She has no wheezes. She has no rales.  Lymphadenopathy:    She has no cervical adenopathy.          Assessment & Plan:

## 2014-09-30 NOTE — Progress Notes (Signed)
Pre visit review using our clinic review tool, if applicable. No additional management support is needed unless otherwise documented below in the visit note. 

## 2014-09-30 NOTE — Assessment & Plan Note (Signed)
Clinically resolved Back to her normal Will recheck CXR for resolution No restrictions

## 2014-11-10 ENCOUNTER — Other Ambulatory Visit: Payer: Self-pay | Admitting: Internal Medicine

## 2014-12-04 DIAGNOSIS — S81801A Unspecified open wound, right lower leg, initial encounter: Secondary | ICD-10-CM | POA: Diagnosis not present

## 2014-12-09 ENCOUNTER — Ambulatory Visit (INDEPENDENT_AMBULATORY_CARE_PROVIDER_SITE_OTHER): Payer: Medicare Other | Admitting: Internal Medicine

## 2014-12-09 ENCOUNTER — Encounter: Payer: Self-pay | Admitting: Internal Medicine

## 2014-12-09 VITALS — BP 120/80 | HR 73 | Temp 97.9°F | Ht 63.0 in | Wt 135.0 lb

## 2014-12-09 DIAGNOSIS — Z7189 Other specified counseling: Secondary | ICD-10-CM

## 2014-12-09 DIAGNOSIS — Z0000 Encounter for general adult medical examination without abnormal findings: Secondary | ICD-10-CM

## 2014-12-09 DIAGNOSIS — G479 Sleep disorder, unspecified: Secondary | ICD-10-CM

## 2014-12-09 DIAGNOSIS — Z1211 Encounter for screening for malignant neoplasm of colon: Secondary | ICD-10-CM

## 2014-12-09 DIAGNOSIS — E039 Hypothyroidism, unspecified: Secondary | ICD-10-CM

## 2014-12-09 LAB — TSH: TSH: 4.55 u[IU]/mL — ABNORMAL HIGH (ref 0.35–4.50)

## 2014-12-09 LAB — CBC WITH DIFFERENTIAL/PLATELET
Basophils Absolute: 0 10*3/uL (ref 0.0–0.1)
Basophils Relative: 0.2 % (ref 0.0–3.0)
Eosinophils Absolute: 0.2 10*3/uL (ref 0.0–0.7)
Eosinophils Relative: 3.9 % (ref 0.0–5.0)
HCT: 36.2 % (ref 36.0–46.0)
HEMOGLOBIN: 11.9 g/dL — AB (ref 12.0–15.0)
LYMPHS ABS: 1 10*3/uL (ref 0.7–4.0)
LYMPHS PCT: 19.2 % (ref 12.0–46.0)
MCHC: 32.9 g/dL (ref 30.0–36.0)
MCV: 94 fl (ref 78.0–100.0)
Monocytes Absolute: 0.4 10*3/uL (ref 0.1–1.0)
Monocytes Relative: 7.4 % (ref 3.0–12.0)
Neutro Abs: 3.8 10*3/uL (ref 1.4–7.7)
Neutrophils Relative %: 69.3 % (ref 43.0–77.0)
Platelets: 250 10*3/uL (ref 150.0–400.0)
RBC: 3.85 Mil/uL — ABNORMAL LOW (ref 3.87–5.11)
RDW: 13.4 % (ref 11.5–15.5)
WBC: 5.4 10*3/uL (ref 4.0–10.5)

## 2014-12-09 LAB — COMPREHENSIVE METABOLIC PANEL
ALT: 21 U/L (ref 0–35)
AST: 29 U/L (ref 0–37)
Albumin: 4.1 g/dL (ref 3.5–5.2)
Alkaline Phosphatase: 65 U/L (ref 39–117)
BUN: 23 mg/dL (ref 6–23)
CHLORIDE: 104 meq/L (ref 96–112)
CO2: 30 mEq/L (ref 19–32)
Calcium: 9.6 mg/dL (ref 8.4–10.5)
Creatinine, Ser: 0.96 mg/dL (ref 0.40–1.20)
GFR: 59.9 mL/min — AB (ref >60.00)
Glucose, Bld: 80 mg/dL (ref 70–99)
Potassium: 4.7 mEq/L (ref 3.5–5.1)
SODIUM: 138 meq/L (ref 135–145)
Total Bilirubin: 0.4 mg/dL (ref 0.2–1.2)
Total Protein: 6.9 g/dL (ref 6.0–8.3)

## 2014-12-09 LAB — T4, FREE: Free T4: 0.85 ng/dL (ref 0.60–1.60)

## 2014-12-09 NOTE — Assessment & Plan Note (Signed)
euthryoid  Will check labs

## 2014-12-09 NOTE — Assessment & Plan Note (Signed)
Has DNR 

## 2014-12-09 NOTE — Assessment & Plan Note (Signed)
I have personally reviewed the Medicare Annual Wellness questionnaire and have noted 1. The patient's medical and social history 2. Their use of alcohol, tobacco or illicit drugs 3. Their current medications and supplements 4. The patient's functional ability including ADL's, fall risks, home safety risks and hearing or visual             impairment. 5. Diet and physical activities 6. Evidence for depression or mood disorders  The patients weight, height, BMI and visual acuity have been recorded in the chart I have made referrals, counseling and provided education to the patient based review of the above and I have provided the pt with a written personalized care plan for preventive services.  I have provided you with a copy of your personalized plan for preventive services. Please take the time to review along with your updated medication list.  UTD with imms other than yearly flu vaccine She will get 1 more mammogram next year Discussed colonoscopy--will do fecal tests instead

## 2014-12-09 NOTE — Progress Notes (Signed)
Subjective:    Patient ID: Robin Logan, female    DOB: 14-Feb-1936, 79 y.o.   MRN: [id number]  HPI Here for Medicare wellness and follow up of chronic medical conditions Reviewed form and advanced directives Reviewed other doctors Uneventful year other than pneumonia--which resolved Exercising regularly Occasional alcohol only No tobacco 1 fall off treadmill this year---did get gash No depression or anhedonia Independent with instrumental ADLs Vision and hearing are okay No apparent cognitive changes  No cough No SOB Good exercise tolerance COPD found by x-ray only  Still on mirtazapine Feels this is why she sleeps well  Satisfied with current bowel regimen No blood in stool  Energy levels are good No change in skin, hair or nails   Current Outpatient Prescriptions on File Prior to Visit  Medication Sig Dispense Refill  . bisacodyl (BISACODYL) 5 MG EC tablet Take 5 mg by mouth daily as needed for moderate constipation.    . cholecalciferol (VITAMIN D) 1000 UNITS tablet Take 1,000 Units by mouth daily.      Marland Kitchen ibuprofen (ADVIL,MOTRIN) 200 MG tablet Take 600 mg by mouth at bedtime as needed. For restless legs symptoms    . levothyroxine (SYNTHROID, LEVOTHROID) 75 MCG tablet TAKE 1 TABLET DAILY 90 tablet 3  . mirtazapine (REMERON) 30 MG tablet TAKE 1 TABLET AT BEDTIME 90 tablet 3  . Multiple Vitamins-Minerals (ICAPS) TABS Take by mouth daily.    Marland Kitchen senna-docusate (SENOKOT-S) 8.6-50 MG per tablet Take 2-4 tablets by mouth 2 (two) times daily.     No current facility-administered medications on file prior to visit.    No Known Allergies  Past Medical History  Diagnosis Date  . Depression   . Hyperlipidemia   . Thyroid disease   . Arthritis   . Osteopenia   . Sleep disorder   . Osteopenia     Past Surgical History  Procedure Laterality Date  . Appendectomy    . Abdominal hysterectomy    . Tonsillectomy and adenoidectomy  1946    Family History  Problem  Relation Age of Onset  . Hypertension Father   . Stroke Brother   . Diabetes Brother   . Cancer Neg Hx     no breast or colon    History   Social History  . Marital Status: Married    Spouse Name: N/A  . Number of Children: N/A  . Years of Education: N/A   Occupational History  . 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    Social History Main Topics  . Smoking status: Former Smoker    Quit date: 06/03/1978  . Smokeless tobacco: Never Used  . Alcohol Use: Yes     Comment: wine  . Drug Use: No  . Sexual Activity: Not on file   Other Topics Concern  . Not on file   Social History Narrative   Requests DNR --order done 12/24/10   Has living will   Husband is health care POA   No tube feedings if cognitively unaware   Review of Systems Appetite is god Weight stable Teeth okay---keeps up with dentist No chest pain No palpitations No edema No skin problems---has seen derm last year    Objective:   Physical Exam  Constitutional: She is oriented to person, place, and time. She appears well-developed and well-nourished. No distress.  HENT:  Mouth/Throat: Oropharynx is clear and moist. No oropharyngeal exudate.  Neck: Normal range of motion. Neck supple. No thyromegaly present.  Cardiovascular: Normal  rate, regular rhythm and normal heart sounds.  Exam reveals no gallop.   No murmur heard. Faint pedal pulses  Pulmonary/Chest: Effort normal and breath sounds normal. No respiratory distress. She has no wheezes. She has no rales.  Abdominal: Soft. There is no tenderness.  Musculoskeletal: She exhibits no edema or tenderness.  Lymphadenopathy:    She has no cervical adenopathy.  Neurological: She is alert and oriented to person, place, and time.  President--- "Ramonita Lab, [place]" [phone] D-l-r-o-w Recall 3/3  Skin: No rash noted. No erythema.  Psychiatric: She has a normal mood and affect. Her behavior is normal.          Assessment & Plan:

## 2014-12-09 NOTE — Assessment & Plan Note (Signed)
Does well with the mirtazapine 

## 2014-12-09 NOTE — Patient Instructions (Signed)
Your mammogram is due around August in 2017

## 2014-12-09 NOTE — Progress Notes (Signed)
Pre visit review using our clinic review tool, if applicable. No additional management support is needed unless otherwise documented below in the visit note. 

## 2015-01-05 ENCOUNTER — Other Ambulatory Visit (INDEPENDENT_AMBULATORY_CARE_PROVIDER_SITE_OTHER): Payer: Medicare Other

## 2015-01-05 DIAGNOSIS — Z1211 Encounter for screening for malignant neoplasm of colon: Secondary | ICD-10-CM

## 2015-01-05 LAB — FECAL OCCULT BLOOD, IMMUNOCHEMICAL: Fecal Occult Bld: NEGATIVE

## 2015-03-21 ENCOUNTER — Encounter: Payer: Self-pay | Admitting: Family Medicine

## 2015-03-21 ENCOUNTER — Ambulatory Visit (INDEPENDENT_AMBULATORY_CARE_PROVIDER_SITE_OTHER): Payer: Medicare Other | Admitting: Family Medicine

## 2015-03-21 ENCOUNTER — Other Ambulatory Visit: Payer: Self-pay | Admitting: Internal Medicine

## 2015-03-21 VITALS — BP 120/70 | HR 74 | Temp 98.5°F | Ht 63.0 in | Wt 135.0 lb

## 2015-03-21 DIAGNOSIS — G479 Sleep disorder, unspecified: Secondary | ICD-10-CM | POA: Diagnosis not present

## 2015-03-21 DIAGNOSIS — Z23 Encounter for immunization: Secondary | ICD-10-CM | POA: Diagnosis not present

## 2015-03-21 DIAGNOSIS — S161XXA Strain of muscle, fascia and tendon at neck level, initial encounter: Secondary | ICD-10-CM | POA: Diagnosis not present

## 2015-03-21 DIAGNOSIS — Z1231 Encounter for screening mammogram for malignant neoplasm of breast: Secondary | ICD-10-CM

## 2015-03-21 MED ORDER — MIRTAZAPINE 45 MG PO TABS: 45.0000 mg | ORAL_TABLET | Freq: Every day | ORAL | Status: DC

## 2015-03-21 NOTE — Patient Instructions (Addendum)
Temporarily increase ibuprofen to 600 mg three times a day.. For 3-4 days then as need. Start home stretching exercises, heat. Increase to 45 mg Remeron. If not improving sleep.. Follow up with Dr. Silvio Pate for sleep.

## 2015-03-21 NOTE — Assessment & Plan Note (Signed)
Reviewed sleep hygeine.  Will try increase of remeron to 45 mg daily. If not improving consider changing to tramadol.

## 2015-03-21 NOTE — Assessment & Plan Note (Signed)
NSAIDs, heat, start gentle home exercises.

## 2015-03-21 NOTE — Progress Notes (Signed)
Pre visit review using our clinic review tool, if applicable. No additional management support is needed unless otherwise documented below in the visit note. 

## 2015-03-21 NOTE — Progress Notes (Signed)
   Subjective:    Patient ID: Robin Logan, female    DOB: 1935/09/07, 79 y.o.   MRN: [id number]  HPI  79 year old female with history of osteopenia, patient of Dr. Silvio Pate presents with new onset  pain in neck  X 3 weeks.   Sharp pains in right posterior neck lower neck to behind right ear. Occuring 2-3 times a day. Last a few minutes. Soreness constant when stretches right neck.  No fall, no change in activity.  She does do yoga.  No triggers.  Improves with massage and pressure. No radiation of pain into arms. No numbness, no weakness.  No ear pain, no congestion.  Using ibuprofen  200 mg 1 tab as needed, helps minimally.   Trouble sleeping.. Using 600 mg  Falls a sleep easily, wakes up 2-3 times a night, hard to go back to sleep then.  Using remeron at night.. Does not help anymore. On 30 mg daily. Does urinate once a night, no anxiety, no depression.  SE to [place] in past, Reviewed sleep hygeine in [place]. Pt has good  Sleep hygeine.  Review of Systems  Constitutional: Negative for fever and fatigue.  HENT: Negative for ear pain.   Eyes: Negative for pain.  Respiratory: Negative for chest tightness and shortness of breath.   Cardiovascular: Negative for chest pain, palpitations and leg swelling.  Gastrointestinal: Negative for abdominal pain.  Genitourinary: Negative for dysuria.       Objective:   Physical Exam  Constitutional: Vital signs are normal. She appears well-developed and well-nourished. She is cooperative.  Non-toxic appearance. She does not appear ill. No distress.  HENT:  Head: Normocephalic.  Right Ear: Hearing, tympanic membrane, external ear and ear canal normal. Tympanic membrane is not erythematous, not retracted and not bulging.  Left Ear: Hearing, tympanic membrane, external ear and ear canal normal. Tympanic membrane is not erythematous, not retracted and not bulging.  Nose: No mucosal edema or rhinorrhea. Right sinus exhibits no maxillary sinus  tenderness and no frontal sinus tenderness. Left sinus exhibits no maxillary sinus tenderness and no frontal sinus tenderness.  Mouth/Throat: Uvula is midline, oropharynx is clear and moist and mucous membranes are normal.  Eyes: Conjunctivae, EOM and lids are normal. Pupils are equal, round, and reactive to light. Lids are everted and swept, no foreign bodies found.  Neck: Trachea normal and normal range of motion. Neck supple. Carotid bruit is not present. No thyroid mass and no thyromegaly present.  Cardiovascular: Normal rate, regular rhythm, S1 normal, S2 normal, normal heart sounds, intact distal pulses and normal pulses.  Exam reveals no gallop and no friction rub.   No murmur heard. Pulmonary/Chest: Effort normal and breath sounds normal. No tachypnea. No respiratory distress. She has no decreased breath sounds. She has no wheezes. She has no rhonchi. She has no rales.  Abdominal: Soft. Normal appearance and bowel sounds are normal. There is no tenderness.  Musculoskeletal:       Cervical back: She exhibits decreased range of motion and tenderness. She exhibits no bony tenderness, no swelling and no edema.  ttp over right trapezius and insertion.  Neurological: She is alert.  Skin: Skin is warm, dry and intact. No rash noted.  Psychiatric: Her speech is normal and behavior is normal. Judgment and thought content normal. Her mood appears not anxious. Cognition and memory are normal. She does not exhibit a depressed mood.          Assessment & Plan:

## 2015-03-30 ENCOUNTER — Ambulatory Visit
Admission: RE | Admit: 2015-03-30 | Discharge: 2015-03-30 | Disposition: A | Discharge status: Home / Self Care | Payer: Medicare Other | Source: Ambulatory Visit | Attending: Internal Medicine | Admitting: Internal Medicine

## 2015-03-30 ENCOUNTER — Other Ambulatory Visit: Payer: Self-pay | Admitting: Internal Medicine

## 2015-03-30 DIAGNOSIS — Z1231 Encounter for screening mammogram for malignant neoplasm of breast: Secondary | ICD-10-CM | POA: Insufficient documentation

## 2015-04-07 DIAGNOSIS — J069 Acute upper respiratory infection, unspecified: Secondary | ICD-10-CM | POA: Diagnosis not present

## 2015-04-16 ENCOUNTER — Other Ambulatory Visit: Payer: Self-pay | Admitting: Family Medicine

## 2015-04-16 NOTE — Telephone Encounter (Signed)
Last office visit 03/21/2015 with Dr. Diona Browner.  Last refilled 03/21/2015 for #30 with no refills.   Ok to refill?

## 2015-04-17 ENCOUNTER — Encounter: Payer: Self-pay | Admitting: Family Medicine

## 2015-04-17 NOTE — Telephone Encounter (Signed)
rx sent to pharmacy by e-script  

## 2015-04-17 NOTE — Telephone Encounter (Signed)
Approved: okay to refill for a year 

## 2015-04-18 MED ORDER — MIRTAZAPINE 45 MG PO TABS: ORAL_TABLET | ORAL | Status: DC

## 2015-04-18 NOTE — Telephone Encounter (Signed)
Please send refill to the correct place and let pt know.

## 2015-05-05 ENCOUNTER — Encounter: Payer: Self-pay | Admitting: Sports Medicine

## 2015-05-05 ENCOUNTER — Ambulatory Visit (INDEPENDENT_AMBULATORY_CARE_PROVIDER_SITE_OTHER): Payer: Medicare Other | Admitting: Sports Medicine

## 2015-05-05 ENCOUNTER — Ambulatory Visit (INDEPENDENT_AMBULATORY_CARE_PROVIDER_SITE_OTHER): Payer: Medicare Other

## 2015-05-05 DIAGNOSIS — M79672 Pain in left foot: Secondary | ICD-10-CM | POA: Diagnosis not present

## 2015-05-05 DIAGNOSIS — Z8739 Personal history of other diseases of the musculoskeletal system and connective tissue: Secondary | ICD-10-CM

## 2015-05-05 DIAGNOSIS — S92912A Unspecified fracture of left toe(s), initial encounter for closed fracture: Secondary | ICD-10-CM

## 2015-05-05 NOTE — Progress Notes (Signed)
Patient ID: Robin Logan, female   DOB: [birth year], 79 y.o.   MRN: [id number] Subjective: Robin Logan is a 79 y.o. female patient who presents to office for evaluation of Left foot pain. Patient complains of progressive pain especially over the last few days over the left 4th toe; reports that she was in yoga class and went over to greet a classmate and had pain and brusing. Patient denies any other pedal complaints. Denies obvious injury/trip/fall/sprain/any causative factors.   Review of Systems  Musculoskeletal: Positive for gait problem.  All other systems reviewed and are negative.  Patient Active Problem List   Diagnosis Date Noted  . Acute strain of neck muscle 03/21/2015  . Counseling regarding advanced directives and goals of care 12/07/2013  . Osteopenia   . Routine general medical examination at a health care facility 11/19/2011  . Other constipation 10/30/2010  . Hypothyroidism 05/01/2010  . HYPERLIPIDEMIA 05/01/2010  . OSTEOARTHRITIS 05/01/2010  . Sleep disorder 05/01/2010   Current Outpatient Prescriptions on File Prior to Visit  Medication Sig Dispense Refill  . cholecalciferol (VITAMIN D) 1000 UNITS tablet Take 1,000 Units by mouth daily.      Marland Kitchen ibuprofen (ADVIL,MOTRIN) 200 MG tablet Take 600 mg by mouth at bedtime as needed. For restless legs symptoms    . levothyroxine (SYNTHROID, LEVOTHROID) 75 MCG tablet TAKE 1 TABLET DAILY 90 tablet 3  . mirtazapine (REMERON) 45 MG tablet TAKE 1 TABLET (45 MG TOTAL) BY MOUTH AT BEDTIME. 90 tablet 3  . Multiple Vitamins-Minerals (ICAPS) TABS Take by mouth daily.    Marland Kitchen senna-docusate (SENOKOT-S) 8.6-50 MG per tablet Take 2-4 tablets by mouth 2 (two) times daily.     No current facility-administered medications on file prior to visit.   No Known Allergies   Objective:  General: Alert and oriented x3 in no acute distress  Dermatology: No open lesions bilateral lower extremities, no webspace macerations, + ecchymosis plantar  sulcus and left 3-4 toes on left, all nails x 10 are well manicured.  Vascular: Dorsalis Pedis and Posterior Tibial pedal pulses 1/4, Capillary Fill Time 3 seconds,(+) pedal hair growth bilateral, no edema bilateral lower extremities, Temperature gradient within normal limits.  Neurology: Gross sensation intact via light touch bilateral, Protective sensation intact  with Semmes Weinstein Monofilament to all pedal sites, Position sense intact, vibratory intact bilateral, Deep tendon reflexes within normal limits bilateral, No babinski sign present bilateral. (- )Tinels sign left foot.   Musculoskeletal: Mild tenderness with palpation at 4th toe on Left and with range of motion,No pain with calf compression bilateral.Strength within normal limits in all groups bilateral.   Gait: Antalgic gait   Xrays  Left Foot 3 views    Impression: Mild decreased osseous mineralization. Hairline fracture of left 4th toe with no displacement. Osteophyte at medial 2nd metatarsal head. Joint spaces well preserved. Mild forefoot soft tissue swelling. No foreign body.   Assessment and Plan: Problem List Items Addressed This Visit    None    Visit Diagnoses    Left foot pain    -  Primary    Relevant Orders    DG Foot Complete Left    Toe fracture, left, closed, initial encounter        4th toe    H/O osteopenia            -Complete examination performed -Xrays reviewed -Discussed treatement options for fracture -Dispensed CAM boot and elastic stocking to wear at all times when attempting ambulation -Cont with Motrin  as needed for pain -Recommend protection, rest, ice, elevation daily. -Patient to return to office in 3 weeks for follow up eval/xrays or sooner if condition worsens.  [name], DPM

## 2015-05-05 NOTE — Patient Instructions (Addendum)
Walking Boot A walking boot (controlled ankle motion boot or CAM walker) is a removable boot-shaped splint that holds your foot or ankle in place after an injury or a medical procedure. This helps with healing and prevents further injury. A walking boot has a stiff, rigid outer frame that limits movement and supports your leg and foot. The inner lining is a layer of padded material. Walking boots usually have several adjustable straps to secure them over the foot. Your health care provider may prescribe a walking boot if it is okay for you to use your injured foot to support your body weight. How much you can walk with the boot on will depend on the type and severity of your injury. Your health care provider will recommend the best boot for you based on your condition. HOW DO I PUT ON MY WALKING BOOT? There are different types of walking boots. Each type of boot has specific instructions about how to wear it properly. Follow instructions from your health care provider about wearing yours. In general:  Sit down to put on your boot. This is more comfortable and it helps to prevent falls.  Open up the boot fully. Place your foot into the boot so that your heel rests against the back.  Your toes should be supported by the base of the boot, but they should not hang over the front.  Adjust the straps so the boot fits securely but is not too tight.  Do not bend the hard frame of the boot to get a good fit.  Ask someone to help you put on the boot, if needed. WHAT ARE SOME TIPS FOR WALKING WITH A WALKING BOOT?  Do not try to walk without wearing the boot unless your health care provider has approved.  Rest your injured leg as much as possible.  Use other assistive walking devices as told by your health care provider. These include crutches and canes.  On your other foot, wear a shoe with a heel that is close to the height of the boot.  Be very careful when walking on surfaces that are uneven or  wet. HOW CAN I REDUCE SWELLING?  Rest your injured foot or leg as much as possible.  If directed, apply ice to the injured area:  Put ice in a plastic bag.  Place a towel between your skin and the bag.  Leave the ice on for 20 minutes, 2-3 times a day for two days or as told by your health care provider.  Keep your injured leg raised (elevated) above the level of your heart for 2-3 hours each day or as told by your health care provider.  If swelling gets worse, loosen the boot and rest and raise your foot.  Contact your health care provider if swelling does not get better or if it gets worse over time. WHAT SKIN CARE PRACTICES SHOULD I FOLLOW?  Wear a long sock to protect your foot and leg from rubbing inside the boot.  Take off the boot one time per day to check the injured area.  Follow instructions from your health care provider about taking care of your incision or wound, if this applies.  Clean and wash the injured area as told by your health care provider.  Gently dry your foot and leg before putting the boot back on.  Contact your health care provider if a wound is getting worse or if your skin becomes red, painful, or irritated. ARE THERE ANY ACTIVITY RESTRICTIONS? Activity  restrictions depend on the type and severity of your injury. Follow instructions from your health care provider.  Bathe and shower as directed by your health care provider.  Do not do activities that could make your injury worse.  Do not drive if your affected foot is one that you usually use for driving. HOW SHOULD I KEEP MY BOOT CLEAN?  Clean the frame and the liner of the boot by hand. Use a washcloth with mild soap and water.  Do not use chemical cleaning products. These could irritate your skin, especially if you have a wound or an incision.  Do not soak the liner of the boot.  Do not put any part of the boot in a washing machine or a clothes dryer.  Allow the boot to air dry  completely before you put it back on your foot.   This information is not intended to replace advice given to you by your health care provider. Make sure you discuss any questions you have with your health care provider.   Document Released: 10/04/2014 Document Reviewed: 10/04/2014 Elsevier Interactive Patient Education 2016 [employer] Toe Fracture A toe fracture is a break in one of the toe bones (phalanges). CAUSES This condition may be caused by:  Dropping a heavy object on your toe.  Stubbing your toe.  Overusing your toe or doing repetitive exercise.  Twisting or stretching your toe out of place. RISK FACTORS This condition is more likely to develop in people who:  Play contact sports.  Have a bone disease.  Have a low calcium level. SYMPTOMS The main symptoms of this condition are swelling and pain in the toe. The pain may get worse with standing or walking. Other symptoms include:  Bruising.  Stiffness.  Numbness.  A change in the way the toe looks.  Broken bones that poke through the skin.  Blood beneath the toenail. DIAGNOSIS This condition is diagnosed with a physical exam. You may also have X-rays. TREATMENT  Treatment for this condition depends on the type of fracture and its severity. Treatment may involve:  Taping the broken toe to a toe that is next to it (buddy taping). This is the most common treatment for fractures in which the bone has not moved out of place (nondisplaced fracture).  Wearing a shoe that has a wide, rigid sole to protect the toe and to limit its movement.  Wearing a walking cast.  Having a procedure to move the toe back into place.  Surgery. This may be needed:  If there are many pieces of broken bone that are out of place (displaced).  If the toe joint breaks.  If the bone breaks through the skin.  Physical therapy. This is done to help regain movement and strength in the toe. You may need follow-up X-rays to make  sure that the bone is healing well and staying in position. HOME CARE INSTRUCTIONS If You Have a Cast:  Do not stick anything inside the cast to scratch your skin. Doing that increases your risk of infection.  Check the skin around the cast every day. Report any concerns to your health care provider. You may put lotion on dry skin around the edges of the cast. Do not apply lotion to the skin underneath the cast.  Do not put pressure on any part of the cast until it is fully hardened. This may take several hours.  Keep the cast clean and dry. Bathing  Do not take baths, swim, or use a  hot tub until your health care provider approves. Ask your health care provider if you can take showers. You may only be allowed to take sponge baths for bathing.  If your health care provider approves bathing and showering, cover the cast or bandage (dressing) with a watertight plastic bag to protect it from water. Do not let the cast or dressing get wet. Managing Pain, Stiffness, and Swelling  If you do not have a cast, apply ice to the injured area, if directed.  Put ice in a plastic bag.  Place a towel between your skin and the bag.  Leave the ice on for 20 minutes, 2-3 times per day.  Move your toes often to avoid stiffness and to lessen swelling.  Raise (elevate) the injured area above the level of your heart while you are sitting or lying down. Driving  Do not drive or operate heavy machinery while taking pain medicine.  Do not drive while wearing a cast on a foot that you use for driving. Activity  Return to your normal activities as directed by your health care provider. Ask your health care provider what activities are safe for you.  Perform exercises daily as directed by your health care provider or physical therapist. Safety  Do not use the injured limb to support your body weight until your health care provider says that you can. Use crutches or other assistive devices as directed by  your health care provider. General Instructions  If your toe was treated with buddy taping, follow your health care provider's instructions for changing the gauze and tape. Change it more often:  The gauze and tape get wet. If this happens, dry the space between the toes.  The gauze and tape are too tight and cause your toe to become pale or numb.  Wear a protective shoe as directed by your health care provider. If you were not given a protective shoe, wear sturdy, supportive shoes. Your shoes should not pinch your toes and should not fit tightly against your toes.  Do not use any tobacco products, including cigarettes, chewing tobacco, or e-cigarettes. Tobacco can delay bone healing. If you need help quitting, ask your health care provider.  Take medicines only as directed by your health care provider.  Keep all follow-up visits as directed by your health care provider. This is important. SEEK MEDICAL CARE IF:  You have a fever.  Your pain medicine is not helping.  Your toe is cold.  Your toe is numb.  You still have pain after one week of rest and treatment.  You still have pain after your health care provider has said that you can start walking again.  You have pain, tingling, or numbness in your foot that is not going away. SEEK IMMEDIATE MEDICAL CARE IF:  You have severe pain.  You have redness or inflammation in your toe that is getting worse.  You have pain or numbness in your toe that is getting worse.  Your toe turns blue.   This information is not intended to replace advice given to you by your health care provider. Make sure you discuss any questions you have with your health care provider.   Document Released: 05/17/2000 Document Revised: 02/08/2015 Document Reviewed: 03/16/2014 Elsevier Interactive Patient Education [employer].

## 2015-05-05 NOTE — Progress Notes (Deleted)
   Subjective:    Patient ID: Robin Logan, female    DOB: 1935-10-24, 79 y.o.   MRN: [id number]  HPI    Review of Systems  Musculoskeletal: Positive for gait problem.  All other systems reviewed and are negative.      Objective:   Physical Exam        Assessment & Plan:

## 2015-05-16 ENCOUNTER — Telehealth: Payer: Self-pay | Admitting: *Deleted

## 2015-05-16 NOTE — Telephone Encounter (Signed)
Pt states she is wearing a fracture boot, and having difficulty walking in parking lots,and would like a handicap sticker.

## 2015-05-30 ENCOUNTER — Encounter: Payer: Self-pay | Admitting: Sports Medicine

## 2015-05-30 ENCOUNTER — Ambulatory Visit (INDEPENDENT_AMBULATORY_CARE_PROVIDER_SITE_OTHER): Payer: Medicare Other

## 2015-05-30 ENCOUNTER — Ambulatory Visit (INDEPENDENT_AMBULATORY_CARE_PROVIDER_SITE_OTHER): Payer: Medicare Other | Admitting: Sports Medicine

## 2015-05-30 DIAGNOSIS — M79672 Pain in left foot: Secondary | ICD-10-CM | POA: Diagnosis not present

## 2015-05-30 DIAGNOSIS — S92912D Unspecified fracture of left toe(s), subsequent encounter for fracture with routine healing: Secondary | ICD-10-CM | POA: Diagnosis not present

## 2015-05-30 NOTE — Progress Notes (Signed)
Patient ID: Robin Logan, female   DOB: January 23, 1936, 79 y.o.   MRN: [id number]  Subjective: Robin Logan is a 79 y.o. female patient who returns to office for evaluation of Left 4th toe fracture. Pain now 3/10. Patient having hard time with strapping boot and balance; request another alternative. Patient denies any other pedal complaints.   Patient Active Problem List   Diagnosis Date Noted  . Acute strain of neck muscle 03/21/2015  . Counseling regarding advanced directives and goals of care 12/07/2013  . Osteopenia   . Routine general medical examination at a health care facility 11/19/2011  . Other constipation 10/30/2010  . Hypothyroidism 05/01/2010  . HYPERLIPIDEMIA 05/01/2010  . OSTEOARTHRITIS 05/01/2010  . Sleep disorder 05/01/2010   Current Outpatient Prescriptions on File Prior to Visit  Medication Sig Dispense Refill  . cholecalciferol (VITAMIN D) 1000 UNITS tablet Take 1,000 Units by mouth daily.      Marland Kitchen ibuprofen (ADVIL,MOTRIN) 200 MG tablet Take 600 mg by mouth at bedtime as needed. For restless legs symptoms    . levothyroxine (SYNTHROID, LEVOTHROID) 75 MCG tablet TAKE 1 TABLET DAILY 90 tablet 3  . mirtazapine (REMERON) 45 MG tablet TAKE 1 TABLET (45 MG TOTAL) BY MOUTH AT BEDTIME. 90 tablet 3  . Multiple Vitamins-Minerals (ICAPS) TABS Take by mouth daily.    Marland Kitchen senna-docusate (SENOKOT-S) 8.6-50 MG per tablet Take 2-4 tablets by mouth 2 (two) times daily.     No current facility-administered medications on file prior to visit.   No Known Allergies   Objective:  General: Alert and oriented x3 in no acute distress  Dermatology: No open lesions bilateral lower extremities, no webspace macerations, ecchymosis plantar sulcus and left 3-4 toes on left resolved, all nails x 10 are well manicured.  Vascular: Dorsalis Pedis and Posterior Tibial pedal pulses 1/4, Capillary Fill Time 3 seconds,(+) pedal hair growth bilateral, no edema bilateral lower extremities however there is  focal edema to left 4th toe, Temperature gradient within normal limits.  Neurology: Gross sensation intact via light touch bilateral, Protective sensation intact  with Semmes Weinstein Monofilament to all pedal sites, Position sense intact, vibratory intact bilateral, Deep tendon reflexes within normal limits bilateral, No babinski sign present bilateral. (- )Tinels sign left foot.   Musculoskeletal: Decreased tenderness with palpation at 4th toe on Left and with range of motion,No pain with calf compression bilateral.Strength within normal limits in all groups bilateral.    Xrays  Left Foot     Impression: Mild decreased osseous mineralization. Hairline fracture of left 4th toe with no gross displacement appears to be slowly consolidating about 40% healed. Unchanged Osteophyte at medial 2nd metatarsal head. Joint spaces well preserved. Mild forefoot soft tissue swelling. No foreign body.   Assessment and Plan: Problem List Items Addressed This Visit    None    Visit Diagnoses    Left foot pain    -  Primary    Relevant Orders    DG Foot 2 Views Left    Toe fracture, left, with routine healing, subsequent encounter        4th         -Complete examination performed -Xrays reviewed -Discussed treatement options for fracture -Transitioned patient from CAM walker to post op shoe to use daily with ambulation -Advised patient to wrap to with coban to splint and assist with edema control to toe -Cont with Motrin as needed for pain -Recommend protection, rest, ice, elevation daily. -Patient to return to office in 3-4  weeks for follow up eval/xrays or sooner if condition worsens. Advised patient may take longer than normal to heal because of hx of osteopenia.   [name], DPM

## 2015-06-27 ENCOUNTER — Encounter: Payer: Self-pay | Admitting: Sports Medicine

## 2015-06-27 ENCOUNTER — Ambulatory Visit (INDEPENDENT_AMBULATORY_CARE_PROVIDER_SITE_OTHER): Payer: Medicare Other | Admitting: Sports Medicine

## 2015-06-27 ENCOUNTER — Ambulatory Visit (INDEPENDENT_AMBULATORY_CARE_PROVIDER_SITE_OTHER): Payer: Medicare Other

## 2015-06-27 DIAGNOSIS — S92912D Unspecified fracture of left toe(s), subsequent encounter for fracture with routine healing: Secondary | ICD-10-CM | POA: Diagnosis not present

## 2015-06-27 DIAGNOSIS — M79672 Pain in left foot: Secondary | ICD-10-CM

## 2015-06-27 DIAGNOSIS — Z8739 Personal history of other diseases of the musculoskeletal system and connective tissue: Secondary | ICD-10-CM | POA: Diagnosis not present

## 2015-06-27 NOTE — Progress Notes (Signed)
Patient ID: [name], female   DOB: [birth year], 80 y.o.   MRN: [id number]  Subjective: Robin Logan is a 80 y.o. female patient who returns to office for evaluation of Left 4th toe fracture; DOI 05-03-15. Pain is getting better but swelling still present to toe. Patient denies any other pedal complaints.   Patient Active Problem List   Diagnosis Date Noted  . Acute strain of neck muscle 03/21/2015  . Counseling regarding advanced directives and goals of care 12/07/2013  . Osteopenia   . Routine general medical examination at a health care facility 11/19/2011  . Other constipation 10/30/2010  . Hypothyroidism 05/01/2010  . HYPERLIPIDEMIA 05/01/2010  . OSTEOARTHRITIS 05/01/2010  . Sleep disorder 05/01/2010   Current Outpatient Prescriptions on File Prior to Visit  Medication Sig Dispense Refill  . cholecalciferol (VITAMIN D) 1000 UNITS tablet Take 1,000 Units by mouth daily.      Marland Kitchen ibuprofen (ADVIL,MOTRIN) 200 MG tablet Take 600 mg by mouth at bedtime as needed. For restless legs symptoms    . levothyroxine (SYNTHROID, LEVOTHROID) 75 MCG tablet TAKE 1 TABLET DAILY 90 tablet 3  . mirtazapine (REMERON) 45 MG tablet TAKE 1 TABLET (45 MG TOTAL) BY MOUTH AT BEDTIME. 90 tablet 3  . Multiple Vitamins-Minerals (ICAPS) TABS Take by mouth daily.    Marland Kitchen senna-docusate (SENOKOT-S) 8.6-50 MG per tablet Take 2-4 tablets by mouth 2 (two) times daily.     No current facility-administered medications on file prior to visit.   No Known Allergies   Objective:  General: Alert and oriented x3 in no acute distress  Dermatology: No open lesions bilateral lower extremities, no webspace macerations, ecchymosis plantar sulcus and left 3-4 toes on left resolved, all nails x 10 are well manicured.  Vascular: Dorsalis Pedis and Posterior Tibial pedal pulses 1/4, Capillary Fill Time 3 seconds,(+) pedal hair growth bilateral, no edema bilateral lower extremities however there is focal edema to left 4th toe,  Temperature gradient within normal limits.  Neurology: Gross sensation intact via light touch bilateral, Protective sensation intact  with Semmes Weinstein Monofilament to all pedal sites, Position sense intact, vibratory intact bilateral, Deep tendon reflexes within normal limits bilateral, No babinski sign present bilateral. (- )Tinels sign left foot.   Musculoskeletal: Decreased tenderness with palpation at 4th toe on Left and with range of motion,No pain with calf compression bilateral.Strength within normal limits in all groups bilateral.    Xrays  Left Foot     Impression: Mild decreased osseous mineralization. Hairline fracture of left 4th toe with no gross displacement appears to be slowly consolidating about 60% healed. Unchanged Osteophyte at medial 2nd metatarsal head. Joint spaces well preserved. Mild forefoot soft tissue swelling. No foreign body.   Assessment and Plan: Problem List Items Addressed This Visit    None    Visit Diagnoses    Left foot pain    -  Primary    Relevant Orders    DG Foot Complete Left    Toe fracture, left, with routine healing, subsequent encounter        4th toe    H/O osteopenia            -Complete examination performed -Xrays reviewed -Discussed treatement options for fracture -Cont with post op shoe to use daily with ambulation -Advised patient to wrap to with coban to splint and assist with edema control to toe -Cont with Motrin as needed for pain -Recommend protection, rest, ice, elevation daily. -Patient to return to office in  3-4 weeks for follow up eval/xrays or sooner if condition worsens. Advised patient may take longer than normal to heal because of hx of osteopenia. May consider bone stim at next encounter.   [name], DPM

## 2015-07-18 ENCOUNTER — Encounter: Payer: Self-pay | Admitting: Sports Medicine

## 2015-07-18 ENCOUNTER — Ambulatory Visit (INDEPENDENT_AMBULATORY_CARE_PROVIDER_SITE_OTHER): Payer: Medicare Other | Admitting: Sports Medicine

## 2015-07-18 ENCOUNTER — Ambulatory Visit (INDEPENDENT_AMBULATORY_CARE_PROVIDER_SITE_OTHER): Payer: Medicare Other

## 2015-07-18 ENCOUNTER — Telehealth: Payer: Self-pay | Admitting: *Deleted

## 2015-07-18 DIAGNOSIS — M79672 Pain in left foot: Secondary | ICD-10-CM

## 2015-07-18 DIAGNOSIS — Z8739 Personal history of other diseases of the musculoskeletal system and connective tissue: Secondary | ICD-10-CM | POA: Diagnosis not present

## 2015-07-18 DIAGNOSIS — S92912G Unspecified fracture of left toe(s), subsequent encounter for fracture with delayed healing: Secondary | ICD-10-CM

## 2015-07-18 NOTE — Telephone Encounter (Addendum)
-----   Message from [name], [place] sent at 07/18/2015 11:04 AM EST ----- Regarding: Bone Stim for toe fracture Hi Valery  Can we request bone stim for patient to have available when she sees me in 3 weeks. Dx: Left 4th toe fracture with minimal healing currently 10.5 weeks since injury to toe Thanks Dr. Cannon Kettle. 07/18/2015-ORDERED EXOGEN BONE GROWTH STIMULATOR from [facility], and he will pick up the paperwork on 07/19/2015.

## 2015-07-18 NOTE — Progress Notes (Signed)
Patient ID: Robin Logan, female   DOB: 03/29/1936, 80 y.o.   MRN: [id number]  Subjective: Keishia Sherburne is a 80 y.o. female patient who returns to office for evaluation of Left 4th toe fracture; DOI 05-03-15. Pain is getting better but still present with swelling to toe. Patient denies any other pedal complaints.   Patient Active Problem List   Diagnosis Date Noted  . Acute strain of neck muscle 03/21/2015  . Counseling regarding advanced directives and goals of care 12/07/2013  . Osteopenia   . Routine general medical examination at a health care facility 11/19/2011  . Other constipation 10/30/2010  . Hypothyroidism 05/01/2010  . HYPERLIPIDEMIA 05/01/2010  . OSTEOARTHRITIS 05/01/2010  . Sleep disorder 05/01/2010   Current Outpatient Prescriptions on File Prior to Visit  Medication Sig Dispense Refill  . cholecalciferol (VITAMIN D) 1000 UNITS tablet Take 1,000 Units by mouth daily.      Marland Kitchen ibuprofen (ADVIL,MOTRIN) 200 MG tablet Take 600 mg by mouth at bedtime as needed. For restless legs symptoms    . levothyroxine (SYNTHROID, LEVOTHROID) 75 MCG tablet TAKE 1 TABLET DAILY 90 tablet 3  . mirtazapine (REMERON) 45 MG tablet TAKE 1 TABLET (45 MG TOTAL) BY MOUTH AT BEDTIME. 90 tablet 3  . Multiple Vitamins-Minerals (ICAPS) TABS Take by mouth daily.    Marland Kitchen senna-docusate (SENOKOT-S) 8.6-50 MG per tablet Take 2-4 tablets by mouth 2 (two) times daily.     No current facility-administered medications on file prior to visit.   No Known Allergies   Objective:  General: Alert and oriented x3 in no acute distress  Dermatology: No open lesions bilateral lower extremities, no webspace macerations, no ecchymosis, all nails x 10 are well manicured.  Vascular: Dorsalis Pedis and Posterior Tibial pedal pulses 1/4, Capillary Fill Time 3 seconds,(+) pedal hair growth bilateral, no edema bilateral lower extremities however there is focal edema to left 4th toe, Temperature gradient within normal  limits.  Neurology: Gross sensation intact via light touch bilateral, Protective sensation intact  with Semmes Weinstein Monofilament to all pedal sites, Position sense intact, vibratory intact bilateral, Deep tendon reflexes within normal limits bilateral, No babinski sign present bilateral. (- )Tinels sign left foot.   Musculoskeletal: Decreased tenderness with palpation at 4th toe on Left and with range of motion,No pain with calf compression bilateral.Strength within normal limits in all groups bilateral.    Xrays  Left Foot     Impression: Mild decreased osseous mineralization. Obvious fracture of left 4th toe with no gross displacement appears to be slowly consolidating about 50-60% healed. Unchanged Osteophyte at medial 2nd metatarsal head. Joint spaces well preserved. Mild forefoot soft tissue swelling. No foreign body.   Assessment and Plan: Problem List Items Addressed This Visit    None    Visit Diagnoses    Left foot pain    -  Primary    Relevant Orders    DG Foot Complete Left    Toe fracture, left, with delayed healing, subsequent encounter        4th toe    H/O osteopenia            -Complete examination performed -Xrays reviewed -Discussed treatement options for fracture -Cont with post op shoe to use daily with ambulation -Advised patient to wrap to with coban to splint and assist with edema control to toe -Cont with Motrin as needed for pain -Recommend protection, rest, ice, elevation daily. -Patient to return to office in 3 weeks for follow up eval/xrays or  sooner if condition worsens. Advised patient may take longer than normal to heal because of hx of osteopenia. May consider bone stim at next encounter.   [name], DPM

## 2015-08-08 ENCOUNTER — Ambulatory Visit: Payer: Medicare Other | Admitting: Sports Medicine

## 2015-08-08 ENCOUNTER — Ambulatory Visit (INDEPENDENT_AMBULATORY_CARE_PROVIDER_SITE_OTHER): Payer: Medicare Other

## 2015-08-08 ENCOUNTER — Encounter: Payer: Self-pay | Admitting: Sports Medicine

## 2015-08-08 DIAGNOSIS — S92912K Unspecified fracture of left toe(s), subsequent encounter for fracture with nonunion: Secondary | ICD-10-CM

## 2015-08-08 DIAGNOSIS — M79672 Pain in left foot: Secondary | ICD-10-CM

## 2015-08-08 DIAGNOSIS — Z8739 Personal history of other diseases of the musculoskeletal system and connective tissue: Secondary | ICD-10-CM

## 2015-08-08 NOTE — Progress Notes (Signed)
Patient ID: Robin Logan, female   DOB: [birth year], 80 y.o.   MRN: [id number]  Subjective: Robin Logan is a 80 y.o. female patient who returns to office for evaluation of Left 4th toe fracture; DOI 05-03-15. Pain is getting better with decreased swelling to toe; patient using coban wrap. Patient denies any other pedal complaints.   Patient Active Problem List   Diagnosis Date Noted  . Acute strain of neck muscle 03/21/2015  . Counseling regarding advanced directives and goals of care 12/07/2013  . Osteopenia   . Routine general medical examination at a health care facility 11/19/2011  . Other constipation 10/30/2010  . Hypothyroidism 05/01/2010  . HYPERLIPIDEMIA 05/01/2010  . OSTEOARTHRITIS 05/01/2010  . Sleep disorder 05/01/2010   Current Outpatient Prescriptions on File Prior to Visit  Medication Sig Dispense Refill  . cholecalciferol (VITAMIN D) 1000 UNITS tablet Take 1,000 Units by mouth daily.      Marland Kitchen ibuprofen (ADVIL,MOTRIN) 200 MG tablet Take 600 mg by mouth at bedtime as needed. For restless legs symptoms    . levothyroxine (SYNTHROID, LEVOTHROID) 75 MCG tablet TAKE 1 TABLET DAILY 90 tablet 3  . mirtazapine (REMERON) 45 MG tablet TAKE 1 TABLET (45 MG TOTAL) BY MOUTH AT BEDTIME. 90 tablet 3  . Multiple Vitamins-Minerals (ICAPS) TABS Take by mouth daily.    Marland Kitchen senna-docusate (SENOKOT-S) 8.6-50 MG per tablet Take 2-4 tablets by mouth 2 (two) times daily.     No current facility-administered medications on file prior to visit.   No Known Allergies   Objective:  General: Alert and oriented x3 in no acute distress  Dermatology: No open lesions bilateral lower extremities, no webspace macerations, no ecchymosis, all nails x 10 are well manicured.  Vascular: Dorsalis Pedis and Posterior Tibial pedal pulses 1/4, Capillary Fill Time 3 seconds,(+) pedal hair growth bilateral, no edema bilateral lower extremities however there is focal edema to left 4th toe, Temperature gradient  within normal limits.  Neurology: Gross sensation intact via light touch bilateral, Protective sensation intact  with Semmes Weinstein Monofilament to all pedal sites, Position sense intact, vibratory intact bilateral, Deep tendon reflexes within normal limits bilateral, No babinski sign present bilateral. (- )Tinels sign left foot.   Musculoskeletal: Decreased tenderness with palpation at 4th toe on Left and with range of motion,No pain with calf compression bilateral.Strength within normal limits in all groups bilateral.    Xrays  Left Foot     Impression: Mild decreased osseous mineralization. Obvious fracture of left 4th toe with no gross displacement. Unchanged Osteophyte at medial 2nd metatarsal head. Joint spaces well preserved. Mild forefoot soft tissue swelling. No foreign body.   Assessment and Plan: Problem List Items Addressed This Visit    None    Visit Diagnoses    Left foot pain    -  Primary    Relevant Orders    DG Foot Complete Left    Toe fracture, left, with nonunion, subsequent encounter        H/O osteopenia            -Complete examination performed -Xrays reviewed -Discussed treatement options for fracture -Patient started on Exogen bone stim therapy; Rep present and educated patient on proper use -Cont with post op shoe to use daily with ambulation -Advised patient to wrap to with coban to splint and assist with edema control to toe -Cont with Motrin as needed for pain -Recommend protection, rest, ice, elevation daily. -Patient to return to office in 3-4 weeks for follow  up eval/xrays or sooner if condition worsens. Advised patient may take longer than normal to heal because of hx of osteopenia.    [name], DPM

## 2015-08-16 ENCOUNTER — Encounter: Payer: Self-pay | Admitting: Internal Medicine

## 2015-08-16 NOTE — Telephone Encounter (Signed)
Message sent to patient that I would forward her message to another provider in Dr Alla German absence.

## 2015-09-01 ENCOUNTER — Ambulatory Visit (INDEPENDENT_AMBULATORY_CARE_PROVIDER_SITE_OTHER): Payer: Medicare Other | Admitting: Sports Medicine

## 2015-09-01 ENCOUNTER — Ambulatory Visit (INDEPENDENT_AMBULATORY_CARE_PROVIDER_SITE_OTHER): Payer: Medicare Other

## 2015-09-01 ENCOUNTER — Encounter: Payer: Self-pay | Admitting: Sports Medicine

## 2015-09-01 DIAGNOSIS — S92912K Unspecified fracture of left toe(s), subsequent encounter for fracture with nonunion: Secondary | ICD-10-CM

## 2015-09-01 DIAGNOSIS — M79672 Pain in left foot: Secondary | ICD-10-CM

## 2015-09-01 DIAGNOSIS — Z8739 Personal history of other diseases of the musculoskeletal system and connective tissue: Secondary | ICD-10-CM

## 2015-09-01 NOTE — Progress Notes (Signed)
Patient ID: Robin Logan, female   DOB: May 14, 1936, 80 y.o.   MRN: [id number] Subjective: Robin Logan is a 80 y.o. female patient who returns to office for evaluation of Left 4th toe fracture; DOI 05-03-15. Patient has been using oxygen bone stim for about 3 weeks. Patient presents to office today as a walk-in states that she had an intense episode of pain last night that lasted about 2 unrelieved with ibuprofen.  States that she wanted her toe to be looked at to make sure nothing had changed or was wrong because this is the first time that she has had pain of this nature in the toe. Patient denies change in activity. States she has been wrapping the toe as instructed. Patient denies any other pedal complaints.   Patient Active Problem List   Diagnosis Date Noted  . Acute strain of neck muscle 03/21/2015  . Counseling regarding advanced directives and goals of care 12/07/2013  . Osteopenia   . Routine general medical examination at a health care facility 11/19/2011  . Other constipation 10/30/2010  . Hypothyroidism 05/01/2010  . HYPERLIPIDEMIA 05/01/2010  . OSTEOARTHRITIS 05/01/2010  . Sleep disorder 05/01/2010   Current Outpatient Prescriptions on File Prior to Visit  Medication Sig Dispense Refill  . cholecalciferol (VITAMIN D) 1000 UNITS tablet Take 1,000 Units by mouth daily.      Marland Kitchen ibuprofen (ADVIL,MOTRIN) 200 MG tablet Take 600 mg by mouth at bedtime as needed. For restless legs symptoms    . levothyroxine (SYNTHROID, LEVOTHROID) 75 MCG tablet TAKE 1 TABLET DAILY 90 tablet 3  . mirtazapine (REMERON) 45 MG tablet TAKE 1 TABLET (45 MG TOTAL) BY MOUTH AT BEDTIME. 90 tablet 3  . Multiple Vitamins-Minerals (ICAPS) TABS Take by mouth daily.    . predniSONE (DELTASONE) 5 MG tablet     . senna-docusate (SENOKOT-S) 8.6-50 MG per tablet Take 2-4 tablets by mouth 2 (two) times daily.     No current facility-administered medications on file prior to visit.   No Known  Allergies   Objective:  General: Alert and oriented x3 in no acute distress  Dermatology: No open lesions bilateral lower extremities, no webspace macerations, no ecchymosis, all nails x 10 are well manicured.  Vascular: Dorsalis Pedis and Posterior Tibial pedal pulses 1/4, Capillary Fill Time 3 seconds,(+) pedal hair growth bilateral, no edema bilateral lower extremities however there is Mild focal edema to left 4th toe, Temperature gradient within normal limits.  Neurology: Gross sensation intact via light touch bilateral, Protective sensation intact  with Semmes Weinstein Monofilament to all pedal sites, Position sense intact, vibratory intact bilateral, Deep tendon reflexes within normal limits bilateral, No babinski sign present bilateral. (- )Tinels sign left foot.   Musculoskeletal: Mild tenderness with palpation at 4th toe on Left and no pain with range of motion,No pain with calf compression bilateral.Strength within normal limits in all groups bilateral.    Xrays  Left Foot     Impression: Mild decreased osseous mineralization. Obvious fracture of left 4th toe with no gross displacement. Unchanged from prior. Unchanged Osteophyte at medial 2nd metatarsal head. Joint spaces well preserved. Mild forefoot soft tissue swelling. No foreign body.   Assessment and Plan: Problem List Items Addressed This Visit    None    Visit Diagnoses    Left foot pain    -  Primary    Relevant Orders    DG Foot Complete Left    Toe fracture, left, with nonunion, subsequent encounter  H/O osteopenia            -Complete examination performed -Xrays reviewed -Discussed plan of care for fracture -Continue with Exogen bone stim therapy -Cont with post op shoe to use daily with ambulation -Advised patient to wrap to with coban to splint and assist with edema control to toe -Cont with Motrin as needed for pain -Recommend protection, rest, ice, elevation daily until symptoms  improve. -Patient to return to office in 3-4 weeks for follow up eval/xrays or sooner if condition worsens. Advised patient may take longer than normal to heal because of hx of osteopenia and explained that pain episodes may occur, especially the more that she is active.    [name], DPM

## 2015-09-05 ENCOUNTER — Ambulatory Visit: Payer: Medicare Other | Admitting: Sports Medicine

## 2015-09-26 ENCOUNTER — Ambulatory Visit: Payer: Medicare Other | Admitting: Sports Medicine

## 2015-09-26 DIAGNOSIS — H5203 Hypermetropia, bilateral: Secondary | ICD-10-CM | POA: Diagnosis not present

## 2015-09-29 ENCOUNTER — Ambulatory Visit (INDEPENDENT_AMBULATORY_CARE_PROVIDER_SITE_OTHER): Payer: Medicare Other | Admitting: Sports Medicine

## 2015-09-29 ENCOUNTER — Ambulatory Visit (INDEPENDENT_AMBULATORY_CARE_PROVIDER_SITE_OTHER): Payer: Medicare Other

## 2015-09-29 ENCOUNTER — Encounter: Payer: Self-pay | Admitting: Sports Medicine

## 2015-09-29 DIAGNOSIS — M79672 Pain in left foot: Secondary | ICD-10-CM | POA: Diagnosis not present

## 2015-09-29 DIAGNOSIS — S92912K Unspecified fracture of left toe(s), subsequent encounter for fracture with nonunion: Secondary | ICD-10-CM | POA: Diagnosis not present

## 2015-09-29 DIAGNOSIS — Z8739 Personal history of other diseases of the musculoskeletal system and connective tissue: Secondary | ICD-10-CM

## 2015-09-29 NOTE — Progress Notes (Signed)
Patient ID: Robin Logan, female   DOB: 03-01-1936, 80 y.o.   MRN: [id number]  Subjective: Robin Logan is a 80 y.o. female patient who returns to office for evaluation of Left 4th toe fracture; DOI 05-03-15. Patient has been using Exogen bone stim for about 7 weeks. Patient states she has been wrapping the toe as instructed. Patient denies any other pedal complaints.   Patient Active Problem List   Diagnosis Date Noted  . Acute strain of neck muscle 03/21/2015  . Counseling regarding advanced directives and goals of care 12/07/2013  . Osteopenia   . Routine general medical examination at a health care facility 11/19/2011  . Other constipation 10/30/2010  . Hypothyroidism 05/01/2010  . HYPERLIPIDEMIA 05/01/2010  . OSTEOARTHRITIS 05/01/2010  . Sleep disorder 05/01/2010   Current Outpatient Prescriptions on File Prior to Visit  Medication Sig Dispense Refill  . cholecalciferol (VITAMIN D) 1000 UNITS tablet Take 1,000 Units by mouth daily.      Marland Kitchen ibuprofen (ADVIL,MOTRIN) 200 MG tablet Take 600 mg by mouth at bedtime as needed. For restless legs symptoms    . levothyroxine (SYNTHROID, LEVOTHROID) 75 MCG tablet TAKE 1 TABLET DAILY 90 tablet 3  . mirtazapine (REMERON) 45 MG tablet TAKE 1 TABLET (45 MG TOTAL) BY MOUTH AT BEDTIME. 90 tablet 3  . Multiple Vitamins-Minerals (ICAPS) TABS Take by mouth daily.    . predniSONE (DELTASONE) 5 MG tablet     . senna-docusate (SENOKOT-S) 8.6-50 MG per tablet Take 2-4 tablets by mouth 2 (two) times daily.     No current facility-administered medications on file prior to visit.   No Known Allergies   Objective:  General: Alert and oriented x3 in no acute distress  Dermatology: No open lesions bilateral lower extremities, no webspace macerations, no ecchymosis, all nails x 10 are well manicured.  Vascular: Dorsalis Pedis and Posterior Tibial pedal pulses 1/4, Capillary Fill Time 3 seconds,(+) pedal hair growth bilateral, no edema bilateral lower  extremities however there is decreased focal edema to left 4th toe, Temperature gradient within normal limits.  Neurology: Gross sensation intact via light touch bilateral, Protective sensation intact  with Semmes Weinstein Monofilament to all pedal sites, Position sense intact, vibratory intact bilateral, Deep tendon reflexes within normal limits bilateral, No babinski sign present bilateral. (-)Tinels sign left foot.   Musculoskeletal: No tenderness with palpation at 4th toe on Left and no pain with range of motion,No pain with calf compression bilateral.Strength within normal limits in all groups bilateral.   Xrays  Left Foot     Impression: Mild decreased osseous mineralization. Prematurely healed fracture of left 4th toe with no gross displacement. Unchanged Osteophyte at medial 2nd metatarsal head. Joint spaces well preserved. No foreign body.   Assessment and Plan: Problem List Items Addressed This Visit    None    Visit Diagnoses    Left foot pain    -  Primary    Relevant Orders    DG Foot Complete Left    Toe fracture, left, with nonunion, subsequent encounter        4th, prematurely healed    H/O osteopenia          -Complete examination performed -Xrays reviewed -Discussed plan of care for fracture -Continue with Exogen bone stim therapy for 2 weeks -Cont with post op shoe to use daily with ambulation for 2 weeks then transition to good supportive shoe -Advised patient to wrap to with coban to splint and assist with edema control to toe  for the next 2 weeks -Cont with Motrin as needed for pain -Recommend protection, rest, ice, elevation daily until symptoms improve. -Patient to return to office in 4 weeks for final follow up eval/xrays or sooner if condition worsens.    [name], DPM

## 2015-11-03 ENCOUNTER — Ambulatory Visit (INDEPENDENT_AMBULATORY_CARE_PROVIDER_SITE_OTHER): Payer: Medicare Other

## 2015-11-03 ENCOUNTER — Ambulatory Visit (INDEPENDENT_AMBULATORY_CARE_PROVIDER_SITE_OTHER): Payer: Medicare Other | Admitting: Sports Medicine

## 2015-11-03 ENCOUNTER — Encounter: Payer: Self-pay | Admitting: Sports Medicine

## 2015-11-03 DIAGNOSIS — Z8739 Personal history of other diseases of the musculoskeletal system and connective tissue: Secondary | ICD-10-CM

## 2015-11-03 DIAGNOSIS — M79672 Pain in left foot: Secondary | ICD-10-CM | POA: Diagnosis not present

## 2015-11-03 DIAGNOSIS — S92912K Unspecified fracture of left toe(s), subsequent encounter for fracture with nonunion: Secondary | ICD-10-CM

## 2015-11-03 NOTE — Progress Notes (Signed)
Patient ID: Robin Logan, female   DOB: 02-27-1936, 80 y.o.   MRN: [id number]  Subjective: Robin Logan is a 80 y.o. female patient who returns to office for evaluation of Left 4th toe fracture; DOI 05-03-15. Patient has been using Exogen bone stim for about 9 weeks. Patient states she has been wrapping the toe as instructed for about 2 weeks after last seen and now has transitioned to good supportive shoes as instructed with no problems. Reports that she went on vacation and wore her post op shoe for a little and had no problems. Patient denies any other pedal complaints.   Patient Active Problem List   Diagnosis Date Noted  . Acute strain of neck muscle 03/21/2015  . Counseling regarding advanced directives and goals of care 12/07/2013  . Osteopenia   . Routine general medical examination at a health care facility 11/19/2011  . Other constipation 10/30/2010  . Hypothyroidism 05/01/2010  . HYPERLIPIDEMIA 05/01/2010  . OSTEOARTHRITIS 05/01/2010  . Sleep disorder 05/01/2010   Current Outpatient Prescriptions on File Prior to Visit  Medication Sig Dispense Refill  . cholecalciferol (VITAMIN D) 1000 UNITS tablet Take 1,000 Units by mouth daily.      Marland Kitchen ibuprofen (ADVIL,MOTRIN) 200 MG tablet Take 600 mg by mouth at bedtime as needed. For restless legs symptoms    . levothyroxine (SYNTHROID, LEVOTHROID) 75 MCG tablet TAKE 1 TABLET DAILY 90 tablet 3  . mirtazapine (REMERON) 45 MG tablet TAKE 1 TABLET (45 MG TOTAL) BY MOUTH AT BEDTIME. 90 tablet 3  . Multiple Vitamins-Minerals (ICAPS) TABS Take by mouth daily.    . predniSONE (DELTASONE) 5 MG tablet     . senna-docusate (SENOKOT-S) 8.6-50 MG per tablet Take 2-4 tablets by mouth 2 (two) times daily.     No current facility-administered medications on file prior to visit.   No Known Allergies   Objective:  General: Alert and oriented x3 in no acute distress  Dermatology: No open lesions bilateral lower extremities, no webspace macerations,  no ecchymosis, all nails x 10 are well manicured.  Vascular: Dorsalis Pedis and Posterior Tibial pedal pulses 1/4, Capillary Fill Time 3 seconds,(+) pedal hair growth bilateral, no edema bilateral lower extremities however there is decreased focal edema to left 4th toe, Temperature gradient within normal limits.  Neurology: Gross sensation intact via light touch bilateral, Protective sensation intact  with Semmes Weinstein Monofilament to all pedal sites, Position sense intact, vibratory intact bilateral, Deep tendon reflexes within normal limits bilateral, No babinski sign present bilateral. (-)Tinels sign left foot.   Musculoskeletal: No tenderness with palpation at 4th toe on Left and no pain with range of motion,No pain with calf compression bilateral.Strength within normal limits in all groups bilateral.   Xrays  Left Foot     Impression: Mild decreased osseous mineralization. Continued prematurely healed fracture of left 4th toe with no gross displacement. Unchanged Osteophyte at medial 2nd metatarsal head. Joint spaces well preserved. No foreign body.   Assessment and Plan: Problem List Items Addressed This Visit    None    Visit Diagnoses    Left foot pain    -  Primary    Relevant Orders    DG Foot Complete Left    Toe fracture, left, with nonunion, subsequent encounter        Resolved    H/O osteopenia          -Complete examination performed -Xrays reviewed -Discussed plan of care for healed fracture -Continue with good supportive shoes  daily -May return to activities to tolerance -Recommend monitor closely for recurrence  -Patient to return to office as needed or sooner if condition worsens.    [name], DPM

## 2015-12-19 ENCOUNTER — Ambulatory Visit (INDEPENDENT_AMBULATORY_CARE_PROVIDER_SITE_OTHER): Payer: Medicare Other | Admitting: Internal Medicine

## 2015-12-19 ENCOUNTER — Encounter: Payer: Self-pay | Admitting: Internal Medicine

## 2015-12-19 VITALS — BP 110/70 | HR 68 | Temp 97.6°F | Ht 63.5 in | Wt 133.5 lb

## 2015-12-19 DIAGNOSIS — Z7189 Other specified counseling: Secondary | ICD-10-CM | POA: Diagnosis not present

## 2015-12-19 DIAGNOSIS — E039 Hypothyroidism, unspecified: Secondary | ICD-10-CM | POA: Diagnosis not present

## 2015-12-19 DIAGNOSIS — G479 Sleep disorder, unspecified: Secondary | ICD-10-CM

## 2015-12-19 DIAGNOSIS — Z0000 Encounter for general adult medical examination without abnormal findings: Secondary | ICD-10-CM | POA: Diagnosis not present

## 2015-12-19 LAB — COMPREHENSIVE METABOLIC PANEL
ALT: 23 U/L (ref 0–35)
AST: 30 U/L (ref 0–37)
Albumin: 4.5 g/dL (ref 3.5–5.2)
Alkaline Phosphatase: 67 U/L (ref 39–117)
BILIRUBIN TOTAL: 0.5 mg/dL (ref 0.2–1.2)
BUN: 23 mg/dL (ref 6–23)
CALCIUM: 10.2 mg/dL (ref 8.4–10.5)
CO2: 29 meq/L (ref 19–32)
CREATININE: 1.06 mg/dL (ref 0.40–1.20)
Chloride: 103 mEq/L (ref 96–112)
GFR: 53.28 mL/min — ABNORMAL LOW (ref >60.00)
GLUCOSE: 89 mg/dL (ref 70–99)
Potassium: 5 mEq/L (ref 3.5–5.1)
Sodium: 139 mEq/L (ref 135–145)
Total Protein: 7.4 g/dL (ref 6.0–8.3)

## 2015-12-19 LAB — T4, FREE: FREE T4: 0.75 ng/dL (ref 0.60–1.60)

## 2015-12-19 LAB — TSH: TSH: 7 u[IU]/mL — ABNORMAL HIGH (ref 0.35–4.50)

## 2015-12-19 NOTE — Patient Instructions (Signed)
Please set up your screening mammogram. This should be your last one.

## 2015-12-19 NOTE — Assessment & Plan Note (Signed)
Clinically euthyroid Will check labs 

## 2015-12-19 NOTE — Assessment & Plan Note (Signed)
I have personally reviewed the Medicare Annual Wellness questionnaire and have noted 1. The patient's medical and social history 2. Their use of alcohol, tobacco or illicit drugs 3. Their current medications and supplements 4. The patient's functional ability including ADL's, fall risks, home safety risks and hearing or visual             impairment. 5. Diet and physical activities 6. Evidence for depression or mood disorders  The patients weight, height, BMI and visual acuity have been recorded in the chart I have made referrals, counseling and provided education to the patient based review of the above and I have provided the pt with a written personalized care plan for preventive services.  I have provided you with a copy of your personalized plan for preventive services. Please take the time to review along with your updated medication list.  Will do 1 more mammogram Prefers not to do FIT anymore Yearly flu vaccine

## 2015-12-19 NOTE — Assessment & Plan Note (Signed)
See social history °Has DNR °

## 2015-12-19 NOTE — Progress Notes (Signed)
Subjective:    Patient ID: Robin Logan, female    DOB: Jan 08, 1936, 80 y.o.   MRN: [id number]  HPI Here for Medicare wellness and follow up of chronic health conditions Reviewed form and advanced directives Reviewed other doctors Rare alcohol No tobacco --quit smoking over 30 years ago Exercises fairly regularly No falls No depression or anhedonia Independent with instrumental ADLs No apparent memory problems Vision fine. Mild hearing issues---nothing worrisome  She feels well Did fracture her toe in November--took 5 months to heal Reviewed DEXA from 2013--only osteopenia. Dietary calcium and takes vitamin D Discussed weights and weight bearing exercise like walking Toe not back to normal--but is better  No problems with thyroid med  Still happy with mirtazapine for sleep Will occasionally add 3 ibuprofen No anxiety or depression  No cough or SOB  Current Outpatient Prescriptions on File Prior to Visit  Medication Sig Dispense Refill  . cholecalciferol (VITAMIN D) 1000 UNITS tablet Take 1,000 Units by mouth daily.      Marland Kitchen ibuprofen (ADVIL,MOTRIN) 200 MG tablet Take 600 mg by mouth at bedtime as needed. For restless legs symptoms    . levothyroxine (SYNTHROID, LEVOTHROID) 75 MCG tablet TAKE 1 TABLET DAILY 90 tablet 3  . mirtazapine (REMERON) 45 MG tablet TAKE 1 TABLET (45 MG TOTAL) BY MOUTH AT BEDTIME. 90 tablet 3  . Multiple Vitamins-Minerals (ICAPS) TABS Take by mouth daily.    Marland Kitchen senna-docusate (SENOKOT-S) 8.6-50 MG per tablet Take 2-4 tablets by mouth 2 (two) times daily.     No current facility-administered medications on file prior to visit.    No Known Allergies  Past Medical History  Diagnosis Date  . Depression   . Hyperlipidemia   . Thyroid disease   . Arthritis   . Osteopenia   . Sleep disorder   . Osteopenia     Past Surgical History  Procedure Laterality Date  . Appendectomy    . Abdominal hysterectomy    . Tonsillectomy and adenoidectomy   1946    Family History  Problem Relation Age of Onset  . Hypertension Father   . Stroke Brother   . Diabetes Brother   . Cancer Neg Hx     no breast or colon    Social History   Social History  . Marital Status: Married    Spouse Name: N/A  . Number of Children: N/A  . Years of Education: N/A   Occupational History  . 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    Social History Main Topics  . Smoking status: Former Smoker    Quit date: 06/03/1978  . Smokeless tobacco: Never Used  . Alcohol Use: 0.0 oz/week    0 Standard drinks or equivalent per week     Comment: wine  . Drug Use: No  . Sexual Activity: Not on file   Other Topics Concern  . Not on file   Social History Narrative   Requests DNR --order done 12/24/10   Has living will   Husband is health care POA   No tube feedings if cognitively unaware   Review of Systems Appetite is fine Weight stable--does go to weight watchers to maintain Wears seat belt Teeth okay--keeps up with dentist Bowels are only okay with the daily senna (laxative dependent). No blood in stool No urinary issues--but does have slow flow at times. No incontinence Some low back pain at times. No other joint problems No rash or suspicious skin lesions No chest pain or  palpitations No dizziness or syncope No headaches    Objective:   Physical Exam  Constitutional: She is oriented to person, place, and time. She appears well-developed and well-nourished. No distress.  HENT:  Mouth/Throat: Oropharynx is clear and moist. No oropharyngeal exudate.  Eyes: Conjunctivae are normal. Pupils are equal, round, and reactive to light.  Neck: Normal range of motion. Neck supple. No thyromegaly present.  Cardiovascular: Normal rate, regular rhythm, normal heart sounds and intact distal pulses.  Exam reveals no gallop.   No murmur heard. Pulmonary/Chest: Effort normal and breath sounds normal. No respiratory distress. She has no wheezes. She  has no rales.  Abdominal: Soft. There is no tenderness.  Musculoskeletal: She exhibits no edema or tenderness.  Lymphadenopathy:    She has no cervical adenopathy.  Neurological: She is alert and oriented to person, place, and time.  President-- "Daisy Floro, Obama, Bush, Clinton" [id number] D-l-r-o-w Recall 3/3  Skin: No rash noted. No erythema.  Psychiatric: She has a normal mood and affect. Her behavior is normal.          Assessment & Plan:

## 2015-12-19 NOTE — Progress Notes (Signed)
Pre visit review using our clinic review tool, if applicable. No additional management support is needed unless otherwise documented below in the visit note. 

## 2015-12-19 NOTE — Assessment & Plan Note (Signed)
Does well with the mirtazapine 

## 2015-12-19 NOTE — Addendum Note (Signed)
Addended by: Marchia Bond on: 12/19/2015 04:12 PM   Modules accepted: Miquel Dunn

## 2015-12-20 LAB — CBC WITH DIFFERENTIAL/PLATELET
BASOS ABS: 0 10*3/uL (ref 0.0–0.1)
BASOS PCT: 0.2 % (ref 0.0–3.0)
EOS ABS: 0.3 10*3/uL (ref 0.0–0.7)
Eosinophils Relative: 4.5 % (ref 0.0–5.0)
HCT: 36.9 % (ref 36.0–46.0)
Hemoglobin: 12.2 g/dL (ref 12.0–15.0)
LYMPHS ABS: 1.5 10*3/uL (ref 0.7–4.0)
Lymphocytes Relative: 25.7 % (ref 12.0–46.0)
MCHC: 33.2 g/dL (ref 30.0–36.0)
MCV: 93 fl (ref 78.0–100.0)
Monocytes Absolute: 0.1 10*3/uL (ref 0.1–1.0)
Monocytes Relative: 2.4 % — ABNORMAL LOW (ref 3.0–12.0)
NEUTROS ABS: 3.8 10*3/uL (ref 1.4–7.7)
NEUTROS PCT: 67.2 % (ref 43.0–77.0)
PLATELETS: 240 10*3/uL (ref 150.0–400.0)
RBC: 3.97 Mil/uL (ref 3.87–5.11)
RDW: 13.6 % (ref 11.5–15.5)
WBC: 5.7 10*3/uL (ref 4.0–10.5)

## 2015-12-28 ENCOUNTER — Ambulatory Visit (INDEPENDENT_AMBULATORY_CARE_PROVIDER_SITE_OTHER): Payer: Medicare Other | Admitting: Internal Medicine

## 2015-12-28 ENCOUNTER — Encounter: Payer: Self-pay | Admitting: Internal Medicine

## 2015-12-28 VITALS — BP 120/64 | HR 68 | Temp 98.7°F | Wt 136.0 lb

## 2015-12-28 DIAGNOSIS — M545 Low back pain, unspecified: Secondary | ICD-10-CM

## 2015-12-28 MED ORDER — PREDNISONE 10 MG PO TABS: ORAL_TABLET | ORAL | 0 refills | Status: DC

## 2015-12-28 NOTE — Progress Notes (Signed)
Pre visit review using our clinic review tool, if applicable. No additional management support is needed unless otherwise documented below in the visit note. 

## 2015-12-28 NOTE — Patient Instructions (Signed)

## 2015-12-28 NOTE — Progress Notes (Signed)
Subjective:    Patient ID: Robin Logan, female    DOB: 09-04-1935, 80 y.o.   MRN: [id number]  HPI  Pt presents to the clinic today with a complaint of lower back pain x 1 week. She describes the pain as constant aching at a severity of 5/10 in the center of her lower back with occasional radiation to the left mid-thigh.  She reports the pain worsens with bending over and she has had trouble sleeping during the past week due to the back pain.  She has tried Ibuprofen and stretches with some relief.  She usually does Yoga 3x/week but has not been able to do so during the past week due to her symptoms.  She denies saddle anesthesia, numbness or tingling in her extremities, or recent trauma.  She reports she has a history of degenerative joint disease and was previously treated by a physiatrist with Prednisone as needed for pain flares with relief, but was unable to schedule an appointment with him and she is scheduled to go on a road trip tomorrow.  Review of Systems   Past Medical History:  Diagnosis Date  . Arthritis   . Depression   . Hyperlipidemia   . Osteopenia   . Osteopenia   . Sleep disorder   . Thyroid disease     Current Outpatient Prescriptions  Medication Sig Dispense Refill  . cholecalciferol (VITAMIN D) 1000 UNITS tablet Take 1,000 Units by mouth daily.      Marland Kitchen ibuprofen (ADVIL,MOTRIN) 200 MG tablet Take 600 mg by mouth at bedtime as needed. For restless legs symptoms    . levothyroxine (SYNTHROID, LEVOTHROID) 75 MCG tablet TAKE 1 TABLET DAILY 90 tablet 3  . mirtazapine (REMERON) 45 MG tablet TAKE 1 TABLET (45 MG TOTAL) BY MOUTH AT BEDTIME. 90 tablet 3  . Multiple Vitamins-Minerals (ICAPS) TABS Take by mouth daily.    Marland Kitchen senna-docusate (SENOKOT-S) 8.6-50 MG per tablet Take 2-4 tablets by mouth 2 (two) times daily.    . predniSONE (DELTASONE) 10 MG tablet Take 6 tabs day 1, 5 tabs day 2, 4 tabs day 3, 3 tabs day 4, 2 tabs day 5, 1 tab day 6 21 tablet 0   No current  facility-administered medications for this visit.     No Known Allergies  Family History  Problem Relation Age of Onset  . Hypertension Father   . Stroke Brother   . Diabetes Brother   . Cancer Neg Hx     no breast or colon    Social History   Social History  . Marital status: Married    Spouse name: N/A  . Number of children: N/A  . Years of education: N/A   Occupational History  . 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    Social History Main Topics  . Smoking status: Former Smoker    Quit date: 06/03/1978  . Smokeless tobacco: Never Used  . Alcohol use 0.0 oz/week     Comment: wine  . Drug use: No  . Sexual activity: Not on file   Other Topics Concern  . Not on file   Social History Narrative   Requests DNR --order done 12/24/10   Has living will   Husband is health care POA   No tube feedings if cognitively unaware     Constitutional: Denies fever, malaise, fatigue, headache or abrupt weight changes.  Musculoskeletal: Pt reports low back. Denies decrease in range of motion, difficulty with gait, muscle pain or  joint swelling.  Skin: Denies redness, rashes, lesions or ulcercations.  Neurological: Denies numbness or tingling, dizziness, difficulty with memory, difficulty with speech or problems with balance and coordination.    No other specific complaints in a complete review of systems (except as listed in HPI above).      Objective:   Physical Exam  BP 120/64 (BP Location: Right Arm, Patient Position: Sitting, Cuff Size: Normal)   Pulse 68   Temp 98.7 F (37.1 C) (Oral)   Wt 136 lb (61.7 kg)   SpO2 98%   BMI 23.71 kg/m   General: Well-appearing, in no acute distress. Skin: No rashes present. MSK:  Spine and paraspinals nontender to palpation.  Full flexion, limited lumbar extension, pain with lateral flexion and rotation.  Full AROM at hips.  Negative SLR. Strength 5/5 bilateral lower extremities. Neuro: Gait normal.  Sensation to  light touch intact.      Assessment & Plan:   Lower back pain:  eRx for Prednisone taper Discontinue Ibuprofen while on steroids Tylenol as needed for pain Try heating pad Continue stretching exercises  Call if symptoms worsen or do not improve BAITY, REGINA, NP

## 2016-01-29 ENCOUNTER — Other Ambulatory Visit: Payer: Self-pay | Admitting: Internal Medicine

## 2016-01-29 DIAGNOSIS — Z1231 Encounter for screening mammogram for malignant neoplasm of breast: Secondary | ICD-10-CM

## 2016-01-29 DIAGNOSIS — E785 Hyperlipidemia, unspecified: Secondary | ICD-10-CM | POA: Insufficient documentation

## 2016-01-30 DIAGNOSIS — M5136 Other intervertebral disc degeneration, lumbar region: Secondary | ICD-10-CM | POA: Diagnosis not present

## 2016-01-30 DIAGNOSIS — M5416 Radiculopathy, lumbar region: Secondary | ICD-10-CM | POA: Diagnosis not present

## 2016-01-30 DIAGNOSIS — M4806 Spinal stenosis, lumbar region: Secondary | ICD-10-CM | POA: Diagnosis not present

## 2016-03-07 DIAGNOSIS — Z23 Encounter for immunization: Secondary | ICD-10-CM | POA: Diagnosis not present

## 2016-04-01 ENCOUNTER — Encounter: Payer: Self-pay | Admitting: Radiology

## 2016-04-01 ENCOUNTER — Ambulatory Visit
Admission: RE | Admit: 2016-04-01 | Discharge: 2016-04-01 | Disposition: A | Discharge status: Home / Self Care | Payer: Medicare Other | Source: Ambulatory Visit | Attending: Internal Medicine | Admitting: Internal Medicine

## 2016-04-01 DIAGNOSIS — Z1231 Encounter for screening mammogram for malignant neoplasm of breast: Secondary | ICD-10-CM

## 2016-04-13 DIAGNOSIS — J069 Acute upper respiratory infection, unspecified: Secondary | ICD-10-CM | POA: Diagnosis not present

## 2016-04-13 DIAGNOSIS — R05 Cough: Secondary | ICD-10-CM | POA: Diagnosis not present

## 2016-04-13 DIAGNOSIS — J019 Acute sinusitis, unspecified: Secondary | ICD-10-CM | POA: Diagnosis not present

## 2016-04-19 ENCOUNTER — Other Ambulatory Visit: Payer: Self-pay | Admitting: Family Medicine

## 2016-09-26 DIAGNOSIS — H25013 Cortical age-related cataract, bilateral: Secondary | ICD-10-CM | POA: Diagnosis not present

## 2016-10-16 ENCOUNTER — Other Ambulatory Visit: Payer: Self-pay | Admitting: Internal Medicine

## 2016-10-25 ENCOUNTER — Other Ambulatory Visit: Payer: Self-pay | Admitting: Internal Medicine

## 2016-10-31 ENCOUNTER — Ambulatory Visit (INDEPENDENT_AMBULATORY_CARE_PROVIDER_SITE_OTHER): Payer: Medicare Other | Admitting: Sports Medicine

## 2016-10-31 DIAGNOSIS — Q667 Congenital pes cavus, unspecified foot: Secondary | ICD-10-CM

## 2016-10-31 NOTE — Progress Notes (Signed)
Agree with below -Dr. Kinnley Paulson 

## 2016-10-31 NOTE — Progress Notes (Signed)
Patient came in for casting for F/O...she is doctor Stover's patient although it has been quite awhile since Dr. Cannon Kettle has seen her.Marland KitchenMarland KitchenMarland KitchenShe presents with cavus foot type and complains of lower back pain when not wearing her spenco OTS foot orthotics.Marland KitchenMarland KitchenShe wants something that will fit into flats...  Goal is to offer longitudinal arch support to "bring the ground up" and take GRF pressure off of heel and met pads.     Plan on EVERFEET to fab sulcus length F/O dress style with 1/16 PPT padding and soft eva cover..  Patient advised Medicare will not cover and her cost will be $398.  Drop charges 398

## 2016-11-27 ENCOUNTER — Ambulatory Visit (INDEPENDENT_AMBULATORY_CARE_PROVIDER_SITE_OTHER): Payer: Medicare Other | Admitting: Podiatry

## 2016-11-27 DIAGNOSIS — Q667 Congenital pes cavus, unspecified foot: Secondary | ICD-10-CM

## 2016-11-27 NOTE — Progress Notes (Signed)
Patient p/u F/O...please with fit and function.

## 2016-12-24 ENCOUNTER — Encounter: Payer: Medicare Other | Admitting: Internal Medicine

## 2017-01-02 ENCOUNTER — Encounter: Payer: Self-pay | Admitting: Internal Medicine

## 2017-01-02 ENCOUNTER — Ambulatory Visit (INDEPENDENT_AMBULATORY_CARE_PROVIDER_SITE_OTHER): Payer: Medicare Other | Admitting: Internal Medicine

## 2017-01-02 VITALS — BP 114/80 | HR 73 | Temp 98.1°F | Ht 63.25 in | Wt 138.0 lb

## 2017-01-02 DIAGNOSIS — Z23 Encounter for immunization: Secondary | ICD-10-CM

## 2017-01-02 DIAGNOSIS — M150 Primary generalized (osteo)arthritis: Secondary | ICD-10-CM | POA: Diagnosis not present

## 2017-01-02 DIAGNOSIS — Z7189 Other specified counseling: Secondary | ICD-10-CM | POA: Diagnosis not present

## 2017-01-02 DIAGNOSIS — G479 Sleep disorder, unspecified: Secondary | ICD-10-CM | POA: Diagnosis not present

## 2017-01-02 DIAGNOSIS — E039 Hypothyroidism, unspecified: Secondary | ICD-10-CM

## 2017-01-02 DIAGNOSIS — Z0000 Encounter for general adult medical examination without abnormal findings: Secondary | ICD-10-CM | POA: Diagnosis not present

## 2017-01-02 DIAGNOSIS — M159 Polyosteoarthritis, unspecified: Secondary | ICD-10-CM

## 2017-01-02 NOTE — Assessment & Plan Note (Signed)
Hasn't been feeling great Will recheck thyroid function and routine labs

## 2017-01-02 NOTE — Assessment & Plan Note (Signed)
Lumbar and cervical spine Sees Dr Sharlet Salina Discussed heat for her neck

## 2017-01-02 NOTE — Progress Notes (Signed)
Subjective:    Patient ID: Robin Logan, female    DOB: 09-17-1935, 81 y.o.   MRN: [id number]  HPI Here for Medicare wellness and follow up of chronic health conditions Reviewed form and advanced directives Reviewed other doctors Will have 1-2 alcohol drinks a week No tobacco Does try to exercise regularly No falls No depression or anhedonia Vision is fine Hearing is not great--is stable. No hearing aides Does have housekeeping for changing sheets, vacuuming, etc--due to back Does the cooking and laundry No sig memory problems  Doesn't feel great this month Has had headache---husband also Neck "cracks and crunches"  Recent exacerbation of back pain Got prednisone from Dr Sharlet Salina Continues in the yoga  Energy levels down slightly Weight stable or slightly up No change in skin, hair or nails  Not sleeping well--increase in mirtazapine not really helpful over the past 1.5 years  Current Outpatient Prescriptions on File Prior to Visit  Medication Sig Dispense Refill  . cholecalciferol (VITAMIN D) 1000 UNITS tablet Take 1,000 Units by mouth daily.      Marland Kitchen ibuprofen (ADVIL,MOTRIN) 200 MG tablet Take 600 mg by mouth at bedtime as needed. For restless legs symptoms    . levothyroxine (SYNTHROID, LEVOTHROID) 75 MCG tablet TAKE 1 TABLET DAILY 90 tablet 0  . mirtazapine (REMERON) 45 MG tablet TAKE 1 TABLET AT BEDTIME 90 tablet 0  . Multiple Vitamins-Minerals (ICAPS) TABS Take by mouth daily.    Marland Kitchen senna-docusate (SENOKOT-S) 8.6-50 MG per tablet Take 2-4 tablets by mouth 2 (two) times daily.     No current facility-administered medications on file prior to visit.     No Known Allergies  Past Medical History:  Diagnosis Date  . Arthritis   . Depression   . Hyperlipidemia   . Osteopenia   . Osteopenia   . Sleep disorder   . Thyroid disease     Past Surgical History:  Procedure Laterality Date  . ABDOMINAL HYSTERECTOMY    . APPENDECTOMY    . TONSILLECTOMY AND  ADENOIDECTOMY  1946    Family History  Problem Relation Age of Onset  . Hypertension Father   . Stroke Brother   . Diabetes Brother   . Cancer Neg Hx        no breast or colon    Social History   Social History  . Marital status: Married    Spouse name: N/A  . Number of children: N/A  . Years of education: N/A   Occupational History  . 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    Social History Main Topics  . Smoking status: Former Smoker    Quit date: 06/03/1978  . Smokeless tobacco: Never Used  . Alcohol use 0.0 oz/week     Comment: wine  . Drug use: No  . Sexual activity: Not on file   Other Topics Concern  . Not on file   Social History Narrative   Requests DNR --order done 12/24/10   Has living will   Husband is health care POA   No tube feedings if cognitively unaware   Review of Systems Appetite is good Wears seat belt Teeth are okay--regular with dentist Bowels are slow--does okay with senna No urinary problems---slow at times No rash or suspicious skin lesions No heartburn or dysphagia No sig joint pains---mostly just back and neck are painful. Discussed hear wrap for neck No chest pain No SOB No dizziness    Objective:   Physical Exam  Constitutional: She  is oriented to person, place, and time. She appears well-nourished. No distress.  HENT:  Mouth/Throat: Oropharynx is clear and moist. No oropharyngeal exudate.  Neck: No thyromegaly present.  Cardiovascular: Normal rate, regular rhythm and normal heart sounds.  Exam reveals no gallop.   No murmur heard. Pulmonary/Chest: Effort normal and breath sounds normal. No respiratory distress. She has no wheezes. She has no rales.  Abdominal: Soft. She exhibits no distension. There is no tenderness. There is no rebound and no guarding.  Musculoskeletal: She exhibits no edema or tenderness.  Lymphadenopathy:    She has no cervical adenopathy.  Neurological: She is alert and oriented to person,  place, and time.  President-- "Pola Corn, Clinton" [id number] D-l-r-o-w Recall 3/3  Skin: No rash noted. No erythema.  Psychiatric: She has a normal mood and affect. Her behavior is normal.          Assessment & Plan:

## 2017-01-02 NOTE — Assessment & Plan Note (Signed)
Problems recently She will try cutting the mirtazapine in half to see if it works better

## 2017-01-02 NOTE — Assessment & Plan Note (Signed)
I have personally reviewed the Medicare Annual Wellness questionnaire and have noted 1. The patient's medical and social history 2. Their use of alcohol, tobacco or illicit drugs 3. Their current medications and supplements 4. The patient's functional ability including ADL's, fall risks, home safety risks and hearing or visual             impairment. 5. Diet and physical activities 6. Evidence for depression or mood disorders  The patients weight, height, BMI and visual acuity have been recorded in the chart I have made referrals, counseling and provided education to the patient based review of the above and I have provided the pt with a written personalized care plan for preventive services.  I have provided you with a copy of your personalized plan for preventive services. Please take the time to review along with your updated medication list.  No more cancer screening --or one last mammogram  Discussed one more FIT--she will defer Stays fit Will update pneumovax Yearly flu vaccine at [facility]

## 2017-01-02 NOTE — Addendum Note (Signed)
Addended by: Pilar Grammes on: 01/02/2017 04:00 PM   Modules accepted: Orders

## 2017-01-02 NOTE — Patient Instructions (Signed)
Please try cutting the mirtazapine in half and see if the lower dose works better for you.

## 2017-01-02 NOTE — Assessment & Plan Note (Signed)
Has DNR 

## 2017-01-03 LAB — COMPREHENSIVE METABOLIC PANEL
ALBUMIN: 4 g/dL (ref 3.5–5.2)
ALK PHOS: 55 U/L (ref 39–117)
ALT: 14 U/L (ref 0–35)
AST: 19 U/L (ref 0–37)
BILIRUBIN TOTAL: 0.3 mg/dL (ref 0.2–1.2)
BUN: 26 mg/dL — ABNORMAL HIGH (ref 6–23)
CALCIUM: 9.2 mg/dL (ref 8.4–10.5)
CO2: 29 meq/L (ref 19–32)
CREATININE: 1.27 mg/dL — AB (ref 0.40–1.20)
Chloride: 104 mEq/L (ref 96–112)
GFR: 43.14 mL/min — AB (ref >60.00)
Glucose, Bld: 83 mg/dL (ref 70–99)
Potassium: 4.8 mEq/L (ref 3.5–5.1)
Sodium: 139 mEq/L (ref 135–145)
TOTAL PROTEIN: 6.6 g/dL (ref 6.0–8.3)

## 2017-01-03 LAB — CBC WITH DIFFERENTIAL/PLATELET
BASOS ABS: 0.1 10*3/uL (ref 0.0–0.1)
Basophils Relative: 1.2 % (ref 0.0–3.0)
EOS ABS: 0.3 10*3/uL (ref 0.0–0.7)
Eosinophils Relative: 5 % (ref 0.0–5.0)
HEMATOCRIT: 36.5 % (ref 36.0–46.0)
HEMOGLOBIN: 11.9 g/dL — AB (ref 12.0–15.0)
LYMPHS PCT: 24.6 % (ref 12.0–46.0)
Lymphs Abs: 1.4 10*3/uL (ref 0.7–4.0)
MCHC: 32.7 g/dL (ref 30.0–36.0)
MCV: 96.5 fl (ref 78.0–100.0)
MONOS PCT: 10.3 % (ref 3.0–12.0)
Monocytes Absolute: 0.6 10*3/uL (ref 0.1–1.0)
Neutro Abs: 3.3 10*3/uL (ref 1.4–7.7)
Neutrophils Relative %: 58.9 % (ref 43.0–77.0)
Platelets: 222 10*3/uL (ref 150.0–400.0)
RBC: 3.79 Mil/uL — AB (ref 3.87–5.11)
RDW: 13.5 % (ref 11.5–15.5)
WBC: 5.6 10*3/uL (ref 4.0–10.5)

## 2017-01-03 LAB — T4, FREE: Free T4: 0.88 ng/dL (ref 0.60–1.60)

## 2017-01-03 LAB — TSH: TSH: 4.64 u[IU]/mL — ABNORMAL HIGH (ref 0.35–4.50)

## 2017-01-08 ENCOUNTER — Encounter: Payer: Self-pay | Admitting: *Deleted

## 2017-01-14 ENCOUNTER — Other Ambulatory Visit: Payer: Self-pay | Admitting: Internal Medicine

## 2017-01-14 DIAGNOSIS — M5416 Radiculopathy, lumbar region: Secondary | ICD-10-CM | POA: Diagnosis not present

## 2017-01-14 DIAGNOSIS — M48062 Spinal stenosis, lumbar region with neurogenic claudication: Secondary | ICD-10-CM | POA: Diagnosis not present

## 2017-01-14 DIAGNOSIS — M5136 Other intervertebral disc degeneration, lumbar region: Secondary | ICD-10-CM | POA: Diagnosis not present

## 2017-01-14 NOTE — Telephone Encounter (Signed)
Received refill electronically Last refill 10/16/16 #90 Last office visit 01/02/17

## 2017-01-14 NOTE — Telephone Encounter (Signed)
Approved: okay for 1 year 

## 2017-01-23 ENCOUNTER — Other Ambulatory Visit: Payer: Self-pay | Admitting: Internal Medicine

## 2017-03-29 DIAGNOSIS — Z23 Encounter for immunization: Secondary | ICD-10-CM | POA: Diagnosis not present

## 2017-04-23 ENCOUNTER — Other Ambulatory Visit: Payer: Self-pay | Admitting: Internal Medicine

## 2017-07-17 DIAGNOSIS — L609 Nail disorder, unspecified: Secondary | ICD-10-CM | POA: Diagnosis not present

## 2017-07-17 DIAGNOSIS — L03019 Cellulitis of unspecified finger: Secondary | ICD-10-CM | POA: Diagnosis not present

## 2017-07-17 DIAGNOSIS — L603 Nail dystrophy: Secondary | ICD-10-CM | POA: Diagnosis not present

## 2017-09-30 DIAGNOSIS — H2513 Age-related nuclear cataract, bilateral: Secondary | ICD-10-CM | POA: Diagnosis not present

## 2017-12-17 ENCOUNTER — Encounter: Payer: Self-pay | Admitting: Internal Medicine

## 2017-12-17 ENCOUNTER — Ambulatory Visit (INDEPENDENT_AMBULATORY_CARE_PROVIDER_SITE_OTHER): Payer: Medicare Other | Admitting: Internal Medicine

## 2017-12-17 VITALS — BP 104/60 | HR 68 | Temp 98.5°F | Ht 63.0 in | Wt 134.0 lb

## 2017-12-17 DIAGNOSIS — R002 Palpitations: Secondary | ICD-10-CM

## 2017-12-17 LAB — COMPREHENSIVE METABOLIC PANEL
ALT: 18 U/L (ref 0–35)
AST: 22 U/L (ref 0–37)
Albumin: 4.3 g/dL (ref 3.5–5.2)
Alkaline Phosphatase: 58 U/L (ref 39–117)
BUN: 17 mg/dL (ref 6–23)
CALCIUM: 9.5 mg/dL (ref 8.4–10.5)
CHLORIDE: 96 meq/L (ref 96–112)
CO2: 28 meq/L (ref 19–32)
Creatinine, Ser: 1.03 mg/dL (ref 0.40–1.20)
GFR: 54.8 mL/min — AB (ref >60.00)
Glucose, Bld: 93 mg/dL (ref 70–99)
POTASSIUM: 4.8 meq/L (ref 3.5–5.1)
Sodium: 131 mEq/L — ABNORMAL LOW (ref 135–145)
Total Bilirubin: 0.4 mg/dL (ref 0.2–1.2)
Total Protein: 7.1 g/dL (ref 6.0–8.3)

## 2017-12-17 LAB — CBC
HCT: 35.9 % — ABNORMAL LOW (ref 36.0–46.0)
Hemoglobin: 12 g/dL (ref 12.0–15.0)
MCHC: 33.6 g/dL (ref 30.0–36.0)
MCV: 92.5 fl (ref 78.0–100.0)
Platelets: 252 10*3/uL (ref 150.0–400.0)
RBC: 3.88 Mil/uL (ref 3.87–5.11)
RDW: 12.8 % (ref 11.5–15.5)
WBC: 5.2 10*3/uL (ref 4.0–10.5)

## 2017-12-17 LAB — T4, FREE: FREE T4: 0.8 ng/dL (ref 0.60–1.60)

## 2017-12-17 LAB — TSH: TSH: 6.58 u[IU]/mL — AB (ref 0.35–4.50)

## 2017-12-17 NOTE — Progress Notes (Signed)
Subjective:    Patient ID: Robin Logan, female    DOB: 03-Aug-1935, 82 y.o.   MRN: [id number]  HPI Here due to palpitations  For a couple of months--- has had some palpitations Got checked out by [facility] nurse--had feeling of racing heart, feels tight in chest, etc Everything was okay there (5 days ago) Will feel more fluttering when lying on left side in bed--gets better if she turns  Spells will come on every few weeks--only a few spells Some dizziness--but not every time Still does yoga--doesn't happen with exercise No change in exercise tolerance Last only a few seconds usually---don't linger  Current Outpatient Medications on File Prior to Visit  Medication Sig Dispense Refill  . cholecalciferol (VITAMIN D) 1000 UNITS tablet Take 1,000 Units by mouth daily.      Marland Kitchen ibuprofen (ADVIL,MOTRIN) 200 MG tablet Take 600 mg by mouth at bedtime as needed. For restless legs symptoms    . levothyroxine (SYNTHROID, LEVOTHROID) 75 MCG tablet TAKE 1 TABLET DAILY 90 tablet 3  . mirtazapine (REMERON) 45 MG tablet TAKE 1 TABLET AT BEDTIME 90 tablet 3  . Multiple Vitamins-Minerals (ICAPS) TABS Take by mouth daily.    Marland Kitchen senna-docusate (SENOKOT-S) 8.6-50 MG per tablet Take 2-4 tablets by mouth 2 (two) times daily.     No current facility-administered medications on file prior to visit.     No Known Allergies  Past Medical History:  Diagnosis Date  . Arthritis   . Depression   . Hyperlipidemia   . Osteopenia   . Osteopenia   . Sleep disorder   . Thyroid disease     Past Surgical History:  Procedure Laterality Date  . ABDOMINAL HYSTERECTOMY    . APPENDECTOMY    . TONSILLECTOMY AND ADENOIDECTOMY  1946    Family History  Problem Relation Age of Onset  . Hypertension Father   . Stroke Brother   . Diabetes Brother   . Cancer Neg Hx        no breast or colon    Social History   Socioeconomic History  . Marital status: Married    Spouse name: Not on file  . Number of  children: Not on file  . Years of education: Not on file  . Highest education level: Not on file  Occupational History  . Occupation: retired- Production manager of deeds for [employer]  . Financial resource strain: Not on file  . Food insecurity:    Worry: Not on file    Inability: Not on file  . Transportation needs:    Medical: Not on file    Non-medical: Not on file  Tobacco Use  . Smoking status: Former Smoker    Last attempt to quit: 06/03/1978    Years since quitting: 39.5  . Smokeless tobacco: Never Used  Substance and Sexual Activity  . Alcohol use: Yes    Alcohol/week: 0.0 oz    Comment: wine  . Drug use: No  . Sexual activity: Not on file  Lifestyle  . Physical activity:    Days per week: Not on file    Minutes per session: Not on file  . Stress: Not on file  Relationships  . Social connections:    Talks on phone: Not on file    Gets together: Not on file    Attends religious service: Not on file    Active member of club or organization: Not on file    Attends meetings of  clubs or organizations: Not on file    Relationship status: Not on file  . Intimate partner violence:    Fear of current or ex partner: Not on file    Emotionally abused: Not on file    Physically abused: Not on file    Forced sexual activity: Not on file  Other Topics Concern  . Not on file  Social History Narrative   Requests DNR --order done 12/24/10   Has living will   Husband is health care POA   No tube feedings if cognitively unaware   Review of Systems  Appetite is fine Weight stable Sleeping well No cough or SOB Has felt well    Objective:   Physical Exam  Constitutional: She appears well-developed. No distress.  Neck: No thyromegaly present.  Cardiovascular: Normal rate, regular rhythm, normal heart sounds and intact distal pulses. Exam reveals no gallop.  No murmur heard. Respiratory: Effort normal and breath sounds normal. No respiratory distress. She has  no wheezes. She has no rales.  GI: Soft. There is no tenderness.  Musculoskeletal: She exhibits no edema.  Lymphadenopathy:    She has no cervical adenopathy.  Psychiatric: She has a normal mood and affect. Her behavior is normal.           Assessment & Plan:

## 2017-12-17 NOTE — Assessment & Plan Note (Addendum)
Going on for 2 months Brief and not exertional No symptoms to suggest coronary ischemia Could be atrial fib or other arrhythmia---but nothing clear to indicate this EKG shows sinus at 65. Normal axis and intervals. No ischemia. No comparison study Will check labs Consider (confirm normal EF and no valve issues)--not approved just for palpitations Consider event monitor or cardiology visit if persists

## 2018-01-06 ENCOUNTER — Ambulatory Visit (INDEPENDENT_AMBULATORY_CARE_PROVIDER_SITE_OTHER): Payer: Medicare Other | Admitting: Internal Medicine

## 2018-01-06 ENCOUNTER — Encounter: Payer: Self-pay | Admitting: Internal Medicine

## 2018-01-06 VITALS — BP 118/76 | HR 67 | Temp 98.0°F | Ht 63.5 in | Wt 131.0 lb

## 2018-01-06 DIAGNOSIS — M150 Primary generalized (osteo)arthritis: Secondary | ICD-10-CM

## 2018-01-06 DIAGNOSIS — Z7189 Other specified counseling: Secondary | ICD-10-CM

## 2018-01-06 DIAGNOSIS — E039 Hypothyroidism, unspecified: Secondary | ICD-10-CM | POA: Diagnosis not present

## 2018-01-06 DIAGNOSIS — Z0000 Encounter for general adult medical examination without abnormal findings: Secondary | ICD-10-CM | POA: Diagnosis not present

## 2018-01-06 DIAGNOSIS — M159 Polyosteoarthritis, unspecified: Secondary | ICD-10-CM

## 2018-01-06 DIAGNOSIS — G479 Sleep disorder, unspecified: Secondary | ICD-10-CM

## 2018-01-06 NOTE — Progress Notes (Signed)
Hearing Screening   Method: Audiometry   125Hz  250Hz  500Hz  1000Hz  2000Hz  3000Hz  4000Hz  6000Hz  8000Hz   Right ear:   40 40 40  0    Left ear:   25 0 40  0    Vision Screening Comments: April 2019

## 2018-01-06 NOTE — Assessment & Plan Note (Signed)
Does fairly well with the mirtazapine

## 2018-01-06 NOTE — Assessment & Plan Note (Signed)
Has DNR 

## 2018-01-06 NOTE — Assessment & Plan Note (Signed)
I have personally reviewed the Medicare Annual Wellness questionnaire and have noted 1. The patient's medical and social history 2. Their use of alcohol, tobacco or illicit drugs 3. Their current medications and supplements 4. The patient's functional ability including ADL's, fall risks, home safety risks and hearing or visual             impairment. 5. Diet and physical activities 6. Evidence for depression or mood disorders  The patients weight, height, BMI and visual acuity have been recorded in the chart I have made referrals, counseling and provided education to the patient based review of the above and I have provided the pt with a written personalized care plan for preventive services.  I have provided you with a copy of your personalized plan for preventive services. Please take the time to review along with your updated medication list.  Keeps fit Yearly flu vaccine Consider shingrix No cancer screening due to age

## 2018-01-06 NOTE — Assessment & Plan Note (Signed)
Seems to be euthryoid Recent labs fine

## 2018-01-06 NOTE — Progress Notes (Signed)
Subjective:    Patient ID: Robin Logan, female    DOB: 01-26-1936, 82 y.o.   MRN: [id number]  HPI Here for Medicare wellness visit and follow up of chronic health conditions Reviewed form and advanced directives Reviewed other doctors Occasional wine No tobacco Exercises regularly No falls No depression or anhedonia Vision and hearing are okay Independent with instrumental ADLs---but doesn't vacuum, etc due to back No major memory issues  Has only had rare feeling in her heart or dizziness Much better No exercise symptoms and stamina is fine No chest pain No SOB No edema  No problems with arthritis Uses the ibuprofen at night prn ---if overdoes it (600mg )  Sleeps okay with the mirtazapine Occasional bad nights No sig daytime somnolence  Energy levels are fine No apparent thyroid problems  Current Outpatient Medications on File Prior to Visit  Medication Sig Dispense Refill  . cholecalciferol (VITAMIN D) 1000 UNITS tablet Take 1,000 Units by mouth daily.      Marland Kitchen ibuprofen (ADVIL,MOTRIN) 200 MG tablet Take 600 mg by mouth at bedtime as needed. For restless legs symptoms    . levothyroxine (SYNTHROID, LEVOTHROID) 75 MCG tablet TAKE 1 TABLET DAILY 90 tablet 3  . mirtazapine (REMERON) 45 MG tablet TAKE 1 TABLET AT BEDTIME 90 tablet 3  . Multiple Vitamins-Minerals (ICAPS) TABS Take by mouth daily.    Marland Kitchen senna-docusate (SENOKOT-S) 8.6-50 MG per tablet Take 2-4 tablets by mouth 2 (two) times daily.     No current facility-administered medications on file prior to visit.     No Known Allergies  Past Medical History:  Diagnosis Date  . Arthritis   . Depression   . Hyperlipidemia   . Osteopenia   . Osteopenia   . Sleep disorder   . Thyroid disease     Past Surgical History:  Procedure Laterality Date  . ABDOMINAL HYSTERECTOMY    . APPENDECTOMY    . TONSILLECTOMY AND ADENOIDECTOMY  1946    Family History  Problem Relation Age of Onset  . Hypertension Father    . Stroke Brother   . Diabetes Brother   . Cancer Neg Hx        no breast or colon    Social History   Socioeconomic History  . Marital status: Married    Spouse name: Not on file  . Number of children: Not on file  . Years of education: Not on file  . Highest education level: Not on file  Occupational History  . Occupation: retired- Production manager of deeds for [employer]  . Financial resource strain: Not on file  . Food insecurity:    Worry: Not on file    Inability: Not on file  . Transportation needs:    Medical: Not on file    Non-medical: Not on file  Tobacco Use  . Smoking status: Former Smoker    Last attempt to quit: 06/03/1978    Years since quitting: 39.6  . Smokeless tobacco: Never Used  Substance and Sexual Activity  . Alcohol use: Yes    Alcohol/week: 0.0 oz    Comment: wine  . Drug use: No  . Sexual activity: Not on file  Lifestyle  . Physical activity:    Days per week: Not on file    Minutes per session: Not on file  . Stress: Not on file  Relationships  . Social connections:    Talks on phone: Not on file    Gets together: Not  on file    Attends religious service: Not on file    Active member of club or organization: Not on file    Attends meetings of clubs or organizations: Not on file    Relationship status: Not on file  . Intimate partner violence:    Fear of current or ex partner: Not on file    Emotionally abused: Not on file    Physically abused: Not on file    Forced sexual activity: Not on file  Other Topics Concern  . Not on file  Social History Narrative   Requests DNR --order done 12/24/10   Has living will   Husband is health care POA   No tube feedings if cognitively unaware   Review of Systems Appetite is fine Lifetime [employer] member. Weight down slightly Wears seat belt Teeth okay. Keeps up with dentist No rash or suspicious lesions. No recent derm visit Bowels are slow chronically. Using senna  regularly and occasional dulcolax tab No heartburn or swallowing problems    Objective:   Physical Exam  Constitutional: She is oriented to person, place, and time. She appears well-developed. No distress.  HENT:  Mouth/Throat: Oropharynx is clear and moist. No oropharyngeal exudate.  Neck: No thyromegaly present.  Cardiovascular: Normal rate, regular rhythm, normal heart sounds and intact distal pulses. Exam reveals no gallop.  No murmur heard. Respiratory: Effort normal and breath sounds normal. No respiratory distress. She has no wheezes. She has no rales.  GI: Soft. There is no tenderness.  Musculoskeletal: She exhibits no edema or tenderness.  Lymphadenopathy:    She has no cervical adenopathy.  Neurological: She is alert and oriented to person, place, and time.  President-- "Trump, Obama, ?" [id number] D-l-r-o-w Recall 3/3  Skin: No rash noted. No erythema.  Psychiatric: She has a normal mood and affect. Her behavior is normal.           Assessment & Plan:

## 2018-01-06 NOTE — Assessment & Plan Note (Signed)
Mostly back Better with yoga Occasional ibuprofen Hasn't needed Dr Sharlet Salina for some time

## 2018-01-11 ENCOUNTER — Other Ambulatory Visit: Payer: Self-pay | Admitting: Internal Medicine

## 2018-01-12 DIAGNOSIS — M5136 Other intervertebral disc degeneration, lumbar region: Secondary | ICD-10-CM | POA: Diagnosis not present

## 2018-01-12 DIAGNOSIS — M5416 Radiculopathy, lumbar region: Secondary | ICD-10-CM | POA: Diagnosis not present

## 2018-01-12 DIAGNOSIS — M48062 Spinal stenosis, lumbar region with neurogenic claudication: Secondary | ICD-10-CM | POA: Diagnosis not present

## 2018-03-13 ENCOUNTER — Ambulatory Visit (INDEPENDENT_AMBULATORY_CARE_PROVIDER_SITE_OTHER): Payer: Medicare Other | Admitting: Podiatry

## 2018-03-13 ENCOUNTER — Encounter: Payer: Self-pay | Admitting: Podiatry

## 2018-03-13 DIAGNOSIS — S91202A Unspecified open wound of left great toe with damage to nail, initial encounter: Secondary | ICD-10-CM | POA: Diagnosis not present

## 2018-03-13 DIAGNOSIS — S91209A Unspecified open wound of unspecified toe(s) with damage to nail, initial encounter: Secondary | ICD-10-CM

## 2018-03-13 MED ORDER — GENTAMICIN SULFATE 0.1 % EX CREA: 1.0000 "application " | TOPICAL_CREAM | Freq: Three times a day (TID) | CUTANEOUS | 1 refills | Status: DC

## 2018-03-13 NOTE — Patient Instructions (Signed)

## 2018-03-17 NOTE — Progress Notes (Signed)
   HPI: 82 year old female presenting today with a chief complaint of tenderness to the left great toenail that began 4 days ago. She reports associated thickening and discoloration of the nail. She states the nail lifted during yoga four days ago and she has been keeping a bandage over it to hold it down. There are no modifying factors noted. Patient is here for further evaluation and treatment.   Past Medical History:  Diagnosis Date  . Arthritis   . Depression   . Hyperlipidemia   . Osteopenia   . Osteopenia   . Sleep disorder   . Thyroid disease      Physical Exam: General: The patient is alert and oriented x3 in no acute distress.  Dermatology: Partially detached nail plate of the left hallux. Skin is warm, dry and supple bilateral lower extremities. Negative for open lesions or macerations.  Vascular: Palpable pedal pulses bilaterally. No edema or erythema noted. Capillary refill within normal limits.  Neurological: Epicritic and protective threshold grossly intact bilaterally.   Musculoskeletal Exam: Range of motion within normal limits to all pedal and ankle joints bilateral. Muscle strength 5/5 in all groups bilateral.   Assessment: 1. Partially detached nail plate left hallux    Plan of Care:  1. Patient evaluated.   2. Discussed treatment alternatives and plan of care. Explained nail avulsion procedure and post procedure course to patient. 3. Patient opted for total temporary nail avulsion.  4. Prior to procedure, local anesthesia infiltration utilized using 3 ml of a 50:50 mixture of 2% plain lidocaine and 0.5% plain marcaine in a normal hallux block fashion and a betadine prep performed.  5. Light dressing applied. 6. Prescription for gentamicin cream provided to patient.  7. Return to clinic as needed.        Edrick Kins, DPM [facility]  Dr. Edrick Kins, DPM    [address], [place]  [address]                Office [phone]  Fax [phone]

## 2018-03-19 DIAGNOSIS — Z23 Encounter for immunization: Secondary | ICD-10-CM | POA: Diagnosis not present

## 2018-03-30 ENCOUNTER — Other Ambulatory Visit: Payer: Self-pay | Admitting: Internal Medicine

## 2018-03-30 DIAGNOSIS — Z1231 Encounter for screening mammogram for malignant neoplasm of breast: Secondary | ICD-10-CM

## 2018-03-31 ENCOUNTER — Ambulatory Visit
Admission: RE | Admit: 2018-03-31 | Discharge: 2018-03-31 | Disposition: A | Discharge status: Home / Self Care | Payer: Medicare Other | Source: Ambulatory Visit | Attending: Internal Medicine | Admitting: Internal Medicine

## 2018-03-31 DIAGNOSIS — Z1231 Encounter for screening mammogram for malignant neoplasm of breast: Secondary | ICD-10-CM | POA: Insufficient documentation

## 2018-06-28 ENCOUNTER — Other Ambulatory Visit: Payer: Self-pay | Admitting: Internal Medicine

## 2018-07-21 ENCOUNTER — Encounter: Payer: Self-pay | Admitting: Podiatry

## 2018-07-21 ENCOUNTER — Ambulatory Visit (INDEPENDENT_AMBULATORY_CARE_PROVIDER_SITE_OTHER): Payer: Medicare Other | Admitting: Podiatry

## 2018-07-21 DIAGNOSIS — B351 Tinea unguium: Secondary | ICD-10-CM | POA: Diagnosis not present

## 2018-07-21 DIAGNOSIS — M79676 Pain in unspecified toe(s): Secondary | ICD-10-CM | POA: Diagnosis not present

## 2018-07-23 NOTE — Progress Notes (Signed)
   SUBJECTIVE Patient presents to office today complaining of elongated, thickened nails that cause pain while ambulating in shoes. She is unable to trim her own nails. Patient is here for further evaluation and treatment.  Past Medical History:  Diagnosis Date  . Arthritis   . Depression   . Hyperlipidemia   . Osteopenia   . Osteopenia   . Sleep disorder   . Thyroid disease     OBJECTIVE General Patient is awake, alert, and oriented x 3 and in no acute distress. Derm Skin is dry and supple bilateral. Negative open lesions or macerations. Remaining integument unremarkable. Nails are tender, long, thickened and dystrophic with subungual debris, consistent with onychomycosis, 1-5 bilateral. No signs of infection noted. Vasc  DP and PT pedal pulses palpable bilaterally. Temperature gradient within normal limits.  Neuro Epicritic and protective threshold sensation grossly intact bilaterally.  Musculoskeletal Exam No symptomatic pedal deformities noted bilateral. Muscular strength within normal limits.  ASSESSMENT 1. Onychodystrophic nails 1-5 bilateral with hyperkeratosis of nails.  2. Onychomycosis of nail due to dermatophyte bilateral 3. Pain in foot bilateral  PLAN OF CARE 1. Patient evaluated today.  2. Instructed to maintain good pedal hygiene and foot care.  3. Mechanical debridement of nails 1-5 bilaterally performed using a nail nipper. Filed with dremel without incident.  4. Return to clinic as needed.    Edrick Kins, DPM [facility]  Dr. Edrick Kins, [address]                                        [place], [place] [address]                Office [phone]  Fax [phone]

## 2018-08-03 DIAGNOSIS — L720 Epidermal cyst: Secondary | ICD-10-CM | POA: Diagnosis not present

## 2018-08-12 ENCOUNTER — Ambulatory Visit (INDEPENDENT_AMBULATORY_CARE_PROVIDER_SITE_OTHER): Payer: Medicare Other | Admitting: Primary Care

## 2018-08-12 ENCOUNTER — Other Ambulatory Visit: Payer: Self-pay

## 2018-08-12 ENCOUNTER — Encounter: Payer: Self-pay | Admitting: Primary Care

## 2018-08-12 DIAGNOSIS — R350 Frequency of micturition: Secondary | ICD-10-CM | POA: Diagnosis not present

## 2018-08-12 LAB — POC URINALSYSI DIPSTICK (AUTOMATED)
BILIRUBIN UA: NEGATIVE
Blood, UA: NEGATIVE
Glucose, UA: NEGATIVE
Ketones, UA: NEGATIVE
Leukocytes, UA: NEGATIVE
NITRITE UA: NEGATIVE
PROTEIN UA: NEGATIVE
SPEC GRAV UA: 1.015 (ref 1.010–1.025)
UROBILINOGEN UA: 0.2 U/dL
pH, UA: 6 (ref 5.0–8.0)

## 2018-08-12 NOTE — Addendum Note (Signed)
Addended by: Jacqualin Combes on: 08/12/2018 03:49 PM   Modules accepted: Orders

## 2018-08-12 NOTE — Assessment & Plan Note (Signed)
Present for the last one month. UA today negative.  Unclear etiology, differentials include interstitial cystitis, vaginal atrophy (causing dysuria). No suspicion for renal stone or other vaginal infection. Labs from July 2019 without hyperglycemia.   Discussed to work on water intake and keep between 48-64 ounces daily, limiting liquids at bedtime. Continue to avoid caffeine products.  She will work on those recommendations and follow up with PCP if symptoms persist.

## 2018-08-12 NOTE — Progress Notes (Signed)
Subjective:    Patient ID: Robin Logan, female    DOB: 09/27/1935, 83 y.o.   MRN: [id number]  HPI  Robin Logan is an 83 year old female who presents today with a chief complaint of urinary frequency.  She also reports some dysuria, foul smelling urine. Her symptoms began about one month ago and have been intermittent since. She's been taking AZO OTC which calms down her symptoms temporarily, last dose was 3 weeks ago. She denies fevers, hematuria, flank pain, vaginal discharge, vaginal itching, vaginal dryness. She has no history of hyperglycemia. She drinks 6-8 glasses of water daily, drinks decaffeinated beverages. She is very active with Yoga several times weekly.   Review of Systems  Constitutional: Negative for fever.  Gastrointestinal: Negative for abdominal pain and nausea.  Genitourinary: Positive for dysuria and frequency. Negative for flank pain, hematuria, pelvic pain and vaginal discharge.       Past Medical History:  Diagnosis Date  . Arthritis   . Depression   . Hyperlipidemia   . Osteopenia   . Osteopenia   . Sleep disorder   . Thyroid disease      Social History   Socioeconomic History  . Marital status: Married    Spouse name: Not on file  . Number of children: Not on file  . Years of education: Not on file  . Highest education level: Not on file  Occupational History  . Occupation: retired- Production manager of deeds for [employer]  . Financial resource strain: Not on file  . Food insecurity:    Worry: Not on file    Inability: Not on file  . Transportation needs:    Medical: Not on file    Non-medical: Not on file  Tobacco Use  . Smoking status: Former Smoker    Last attempt to quit: 06/03/1978    Years since quitting: 40.2  . Smokeless tobacco: Never Used  Substance and Sexual Activity  . Alcohol use: Yes    Alcohol/week: 0.0 standard drinks    Comment: wine  . Drug use: No  . Sexual activity: Not on file  Lifestyle  .  Physical activity:    Days per week: Not on file    Minutes per session: Not on file  . Stress: Not on file  Relationships  . Social connections:    Talks on phone: Not on file    Gets together: Not on file    Attends religious service: Not on file    Active member of club or organization: Not on file    Attends meetings of clubs or organizations: Not on file    Relationship status: Not on file  . Intimate partner violence:    Fear of current or ex partner: Not on file    Emotionally abused: Not on file    Physically abused: Not on file    Forced sexual activity: Not on file  Other Topics Concern  . Not on file  Social History Narrative   Requests DNR --order done 12/24/10   Has living will   Husband is health care POA   No tube feedings if cognitively unaware    Past Surgical History:  Procedure Laterality Date  . ABDOMINAL HYSTERECTOMY    . APPENDECTOMY    . TONSILLECTOMY AND ADENOIDECTOMY  1946    Family History  Problem Relation Age of Onset  . Hypertension Father   . Stroke Brother   . Diabetes Brother   . Cancer  Neg Hx        no breast or colon    No Known Allergies  Current Outpatient Medications on File Prior to Visit  Medication Sig Dispense Refill  . cholecalciferol (VITAMIN D) 1000 UNITS tablet Take 1,000 Units by mouth daily.      Marland Kitchen ibuprofen (ADVIL,MOTRIN) 200 MG tablet Take 600 mg by mouth at bedtime as needed. For restless legs symptoms    . levothyroxine (SYNTHROID, LEVOTHROID) 75 MCG tablet TAKE 1 TABLET DAILY 90 tablet 3  . mirtazapine (REMERON) 45 MG tablet TAKE 1 TABLET AT BEDTIME 90 tablet 3  . senna-docusate (SENOKOT-S) 8.6-50 MG per tablet Take 2-4 tablets by mouth 2 (two) times daily.     No current facility-administered medications on file prior to visit.     BP 116/72   Pulse 72   Temp 98 F (36.7 C) (Oral)   Ht 5\' 3"  (1.6 m)   Wt 128 lb (58.1 kg)   SpO2 98%   BMI 22.67 kg/m    Objective:   Physical Exam  Constitutional: She  appears well-nourished.  Cardiovascular: Normal rate.  GI: Soft. Bowel sounds are normal. There is no abdominal tenderness. There is no CVA tenderness.  Skin: Skin is warm and dry.           Assessment & Plan:

## 2018-08-12 NOTE — Patient Instructions (Signed)
Make sure to take your thyroid medication with water only, no food or other medications for 30 minutes. No vitamins, heartburn medication, or iron for four hours.  Follow up with Dr. Silvio Pate as scheduled regarding your thyroid function and if your urinary symptoms persist.  Ensure you are consuming 48-64 ounces of water daily.  Caffeine and alcohol can cause increased frequency of urination.   It was a pleasure meeting you!   Interstitial Cystitis  Interstitial cystitis is inflammation of the bladder. This may cause pain in the bladder area as well as a frequent and urgent need to urinate. The bladder is a hollow organ in the lower part of the abdomen. It stores urine after the urine is made in the kidneys. The severity of interstitial cystitis can vary from person to person. You may have flare-ups, and then your symptoms may go away for a while. For many people, it becomes a long-term (chronic) problem. What are the causes? The cause of this condition is not known. What increases the risk? The following factors may make you more likely to develop this condition:  You are female.  You have fibromyalgia.  You have irritable bowel syndrome (IBS).  You have endometriosis. This condition may be aggravated by:  Stress.  Smoking.  Spicy foods. What are the signs or symptoms? Symptoms of interstitial cystitis vary, and they can change over time. Symptoms may include:  Discomfort or pain in the bladder area, which is in the lower abdomen. Pain can range from mild to severe. The pain may change in intensity as the bladder fills with urine or as it empties.  Pain in the pelvic area, between the hip bones.  An urgent need to urinate.  Frequent urination.  Pain during urination.  Pain during sex.  Blood in the urine. For women, symptoms often get worse during menstruation. How is this diagnosed? This condition is diagnosed based on your symptoms, your medical history, and a  physical exam. You may have tests to rule out other conditions, such as:  Urine tests.  Cystoscopy. For this test, a tool similar to a very thin telescope is used to look into your bladder.  Biopsy. This involves taking a sample of tissue from the bladder to be examined under a microscope. How is this treated? There is no cure for this condition, but treatment can help you control your symptoms. Work closely with your health care provider to find the most effective treatments for you. Treatment options may include:  Medicines to relieve pain and reduce how often you feel the need to urinate.  Learning ways to control when you urinate (bladder training).  Lifestyle changes, such as changing your diet or taking steps to control stress.  Using a device that provides electrical stimulation to your nerves, which can relieve pain (neuromodulation therapy). The device is placed on your back, where it blocks the nerves that cause you to feel pain in your bladder area.  A procedure that stretches your bladder by filling it with air or fluid.  Surgery. This is rare. It is only done for extreme cases, if other treatments do not help. Follow these instructions at home: Bladder training   Use bladder training techniques as directed. Techniques may include: ? Urinating at scheduled times. ? Training yourself to delay urination. ? Doing exercises (Kegel exercises) to strengthen the muscles that control urine flow.  Keep a bladder diary. ? Write down the times that you urinate and any symptoms that you have. This  can help you find out which foods, liquids, or activities make your symptoms worse. ? Use your bladder diary to schedule bathroom trips. If you are away from home, plan to be near a bathroom at each of your scheduled times.  Make sure that you urinate just before you leave the house and just before you go to bed. Eating and drinking  Make dietary changes as recommended by your health  care provider. You may need to avoid: ? Spicy foods. ? Foods that contain a lot of potassium.  Limit your intake of beverages that make you need to urinate. These include: ? Caffeinated beverages like soda, coffee, and tea. ? Alcohol. General instructions  Take over-the-counter and prescription medicines only as told by your health care provider.  Do not drink alcohol.  You can try a warm or cool compress over your bladder for comfort.  Avoid wearing tight clothing.  Do not use any products that contain nicotine or tobacco, such as cigarettes and e-cigarettes. If you need help quitting, ask your health care provider.  Keep all follow-up visits as told by your health care provider. This is important. Contact a health care provider if you have:  Symptoms that do not get better with treatment.  Pain or discomfort that gets worse.  More frequent urges to urinate.  A fever. Get help right away if:  You have no control over when you urinate. Summary  Interstitial cystitis is inflammation of the bladder.  This condition may cause pain in the bladder area as well as a frequent and urgent need to urinate.  You may have flare-ups of the condition, and then it may go away for a while. For many people, it becomes a long-term (chronic) problem.  There is no cure for interstitial cystitis, but treatment methods are available to control your symptoms. This information is not intended to replace advice given to you by your health care provider. Make sure you discuss any questions you have with your health care provider. Document Released: 01/19/2004 Document Revised: 04/14/2017 Document Reviewed: 04/14/2017 Elsevier Interactive Patient Education  2019 [employer].

## 2018-11-09 DIAGNOSIS — H25013 Cortical age-related cataract, bilateral: Secondary | ICD-10-CM | POA: Diagnosis not present

## 2018-11-25 DIAGNOSIS — H9193 Unspecified hearing loss, bilateral: Secondary | ICD-10-CM

## 2018-12-21 DIAGNOSIS — H903 Sensorineural hearing loss, bilateral: Secondary | ICD-10-CM | POA: Diagnosis not present

## 2019-01-12 ENCOUNTER — Encounter: Payer: Self-pay | Admitting: Internal Medicine

## 2019-01-12 ENCOUNTER — Other Ambulatory Visit: Payer: Self-pay

## 2019-01-12 ENCOUNTER — Ambulatory Visit (INDEPENDENT_AMBULATORY_CARE_PROVIDER_SITE_OTHER): Payer: Medicare Other | Admitting: Internal Medicine

## 2019-01-12 VITALS — BP 118/70 | HR 72 | Temp 97.2°F | Ht 63.25 in | Wt 127.0 lb

## 2019-01-12 DIAGNOSIS — G479 Sleep disorder, unspecified: Secondary | ICD-10-CM

## 2019-01-12 DIAGNOSIS — Z0000 Encounter for general adult medical examination without abnormal findings: Secondary | ICD-10-CM

## 2019-01-12 DIAGNOSIS — M8580 Other specified disorders of bone density and structure, unspecified site: Secondary | ICD-10-CM | POA: Diagnosis not present

## 2019-01-12 DIAGNOSIS — K5909 Other constipation: Secondary | ICD-10-CM | POA: Diagnosis not present

## 2019-01-12 DIAGNOSIS — Z7189 Other specified counseling: Secondary | ICD-10-CM

## 2019-01-12 DIAGNOSIS — E039 Hypothyroidism, unspecified: Secondary | ICD-10-CM | POA: Diagnosis not present

## 2019-01-12 LAB — COMPREHENSIVE METABOLIC PANEL
ALT: 16 U/L (ref 0–35)
AST: 22 U/L (ref 0–37)
Albumin: 4.5 g/dL (ref 3.5–5.2)
Alkaline Phosphatase: 66 U/L (ref 39–117)
BUN: 19 mg/dL (ref 6–23)
CO2: 29 mEq/L (ref 19–32)
Calcium: 9.8 mg/dL (ref 8.4–10.5)
Chloride: 99 mEq/L (ref 96–112)
Creatinine, Ser: 0.94 mg/dL (ref 0.40–1.20)
GFR: 57.14 mL/min — ABNORMAL LOW (ref >60.00)
Glucose, Bld: 83 mg/dL (ref 70–99)
Potassium: 5.3 mEq/L — ABNORMAL HIGH (ref 3.5–5.1)
Sodium: 133 mEq/L — ABNORMAL LOW (ref 135–145)
Total Bilirubin: 0.4 mg/dL (ref 0.2–1.2)
Total Protein: 6.7 g/dL (ref 6.0–8.3)

## 2019-01-12 LAB — CBC
HCT: 36.5 % (ref 36.0–46.0)
Hemoglobin: 12.1 g/dL (ref 12.0–15.0)
MCHC: 33.1 g/dL (ref 30.0–36.0)
MCV: 94.2 fl (ref 78.0–100.0)
Platelets: 251 10*3/uL (ref 150.0–400.0)
RBC: 3.87 Mil/uL (ref 3.87–5.11)
RDW: 13.2 % (ref 11.5–15.5)
WBC: 3.8 10*3/uL — ABNORMAL LOW (ref 4.0–10.5)

## 2019-01-12 LAB — TSH: TSH: 14.01 u[IU]/mL — ABNORMAL HIGH (ref 0.35–4.50)

## 2019-01-12 LAB — T4, FREE: Free T4: 0.83 ng/dL (ref 0.60–1.60)

## 2019-01-12 NOTE — Assessment & Plan Note (Signed)
Recommend adding miralax to hopefully avoid the dulcolax every 3 days

## 2019-01-12 NOTE — Progress Notes (Signed)
Subjective:    Patient ID: Robin Logan, female    DOB: September 08, 1935, 83 y.o.   MRN: [id number]  HPI Here for Medicare wellness visit and follow up of chronic health conditions Reviewed form and advanced directives Reviewed other doctors Occasional glass of wine No tobacco for decades Exercises fairly regularly No falls No depression or anhedonia Vision is fading some--may need cataract removal in time (avoids driving at night) Hearing is poor---considering hearing aides Independent with instrumental ADLs No significant memory problems  No new concerns Happy at [place]--picks up meals at the restaurants Able to be outside Does shop, etc. Now rare time in restaurant with social distancing   Still uses the mirtazapine for sleep This is very effective Occasional sleepless nights--but rare  Some leg pain periodically Uses ibuprofen as needed  No regular back problems---yoga has really kept her in shape  Energy levels fine Weight is stable No change in skin, hair or nails  Still on vitamin D for osteopenia Regular weight bearing exercise  Current Outpatient Medications on File Prior to Visit  Medication Sig Dispense Refill  . cholecalciferol (VITAMIN D) 1000 UNITS tablet Take 1,000 Units by mouth daily.      Marland Kitchen ibuprofen (ADVIL,MOTRIN) 200 MG tablet Take 600 mg by mouth at bedtime as needed. For restless legs symptoms    . levothyroxine (SYNTHROID, LEVOTHROID) 75 MCG tablet TAKE 1 TABLET DAILY 90 tablet 3  . mirtazapine (REMERON) 45 MG tablet TAKE 1 TABLET AT BEDTIME 90 tablet 3  . senna-docusate (SENOKOT-S) 8.6-50 MG per tablet Take 2-4 tablets by mouth 2 (two) times daily.     No current facility-administered medications on file prior to visit.     No Known Allergies  Past Medical History:  Diagnosis Date  . Arthritis   . Depression   . Hyperlipidemia   . Osteopenia   . Osteopenia   . Sleep disorder   . Thyroid disease     Past Surgical History:   Procedure Laterality Date  . ABDOMINAL HYSTERECTOMY    . APPENDECTOMY    . TONSILLECTOMY AND ADENOIDECTOMY  1946    Family History  Problem Relation Age of Onset  . Hypertension Father   . Stroke Brother   . Diabetes Brother   . Cancer Neg Hx        no breast or colon    Social History   Socioeconomic History  . Marital status: Married    Spouse name: Not on file  . Number of children: Not on file  . Years of education: Not on file  . Highest education level: Not on file  Occupational History  . Occupation: retired- Production manager of deeds for [employer]  . Financial resource strain: Not on file  . Food insecurity    Worry: Not on file    Inability: Not on file  . Transportation needs    Medical: Not on file    Non-medical: Not on file  Tobacco Use  . Smoking status: Former Smoker    Quit date: 06/03/1978    Years since quitting: 40.6  . Smokeless tobacco: Never Used  Substance and Sexual Activity  . Alcohol use: Yes    Alcohol/week: 0.0 standard drinks    Comment: wine  . Drug use: No  . Sexual activity: Not on file  Lifestyle  . Physical activity    Days per week: Not on file    Minutes per session: Not on file  . Stress:  Not on file  Relationships  . Social Herbalist on phone: Not on file    Gets together: Not on file    Attends religious service: Not on file    Active member of club or organization: Not on file    Attends meetings of clubs or organizations: Not on file    Relationship status: Not on file  . Intimate partner violence    Fear of current or ex partner: Not on file    Emotionally abused: Not on file    Physically abused: Not on file    Forced sexual activity: Not on file  Other Topics Concern  . Not on file  Social History Narrative   Requests DNR --order done 12/24/10   Has living will   Husband is health care POA   No tube feedings if cognitively unaware   Review of Systems Appetite is fine Wears seat  belt Teeth are fine--keeps up with dentist No skin lesions or rash No chest pain or palpitations No dizziness or syncope No edema No headaches Bowels remains slow---takes senna bid and dulcolax every 3 days. No blood (discussed adding daily miralax to the senna) No urinary symptoms. Remains continent No heartburn or dysphagia    Objective:   Physical Exam  Constitutional: She is oriented to person, place, and time. She appears well-developed. No distress.  HENT:  Mouth/Throat: Oropharynx is clear and moist. No oropharyngeal exudate.  Neck: No thyromegaly present.  Cardiovascular: Normal rate, regular rhythm and normal heart sounds. Exam reveals no gallop.  No murmur heard. Respiratory: Effort normal and breath sounds normal. No respiratory distress. She has no wheezes. She has no rales.  GI: Soft. There is no abdominal tenderness.  Musculoskeletal:        General: No tenderness or edema.  Lymphadenopathy:    She has no cervical adenopathy.  Neurological: She is alert and oriented to person, place, and time.  President--- "Dwaine Deter, Bush" [phone] D-l-r-o-w Recall 3/3  Skin: No rash noted. No erythema.  Psychiatric: She has a normal mood and affect. Her behavior is normal.           Assessment & Plan:

## 2019-01-12 NOTE — Assessment & Plan Note (Signed)
I have personally reviewed the Medicare Annual Wellness questionnaire and have noted 1. The patient's medical and social history 2. Their use of alcohol, tobacco or illicit drugs 3. Their current medications and supplements 4. The patient's functional ability including ADL's, fall risks, home safety risks and hearing or visual             impairment. 5. Diet and physical activities 6. Evidence for depression or mood disorders  The patients weight, height, BMI and visual acuity have been recorded in the chart I have made referrals, counseling and provided education to the patient based review of the above and I have provided the pt with a written personalized care plan for preventive services.  I have provided you with a copy of your personalized plan for preventive services. Please take the time to review along with your updated medication list.  Discussed cancer screening---didn't recommend unless symptoms Flu vaccine in 1-2 months Consider shingrix at pharmacy Exercises regularly

## 2019-01-12 NOTE — Progress Notes (Signed)
Hearing Screening   125Hz  250Hz  500Hz  1000Hz  2000Hz  3000Hz  4000Hz  6000Hz  8000Hz   Right ear:           Left ear:           Comments: July 2020. Suggested Hearing Aids. Pt deciding what to do.  Vision Screening Comments: June 2020

## 2019-01-12 NOTE — Assessment & Plan Note (Signed)
See social history 

## 2019-01-12 NOTE — Assessment & Plan Note (Signed)
Vitamin D and regular exercise Consider DEXA next year with bisphosphate if osteoporosis

## 2019-01-12 NOTE — Assessment & Plan Note (Signed)
Euthyroid Will recheck labs

## 2019-01-12 NOTE — Assessment & Plan Note (Signed)
Does well with the mirtazapine

## 2019-01-25 DIAGNOSIS — H2513 Age-related nuclear cataract, bilateral: Secondary | ICD-10-CM | POA: Diagnosis not present

## 2019-02-05 ENCOUNTER — Other Ambulatory Visit: Payer: Self-pay

## 2019-02-05 MED ORDER — LEVOTHYROXINE SODIUM 88 MCG PO TABS: 88.0000 ug | ORAL_TABLET | Freq: Every day | ORAL | 3 refills | Status: DC

## 2019-02-05 NOTE — Telephone Encounter (Signed)
Levothyroxine sent to [facility], Spoke to pt.

## 2019-02-19 ENCOUNTER — Other Ambulatory Visit: Payer: Self-pay | Admitting: Internal Medicine

## 2019-02-23 DIAGNOSIS — H2511 Age-related nuclear cataract, right eye: Secondary | ICD-10-CM | POA: Diagnosis not present

## 2019-02-23 DIAGNOSIS — E0590 Thyrotoxicosis, unspecified without thyrotoxic crisis or storm: Secondary | ICD-10-CM | POA: Diagnosis not present

## 2019-02-26 ENCOUNTER — Other Ambulatory Visit
Admission: RE | Admit: 2019-02-26 | Discharge: 2019-02-26 | Disposition: A | Discharge status: Home / Self Care | Payer: Medicare Other | Source: Ambulatory Visit | Attending: Ophthalmology | Admitting: Ophthalmology

## 2019-02-26 DIAGNOSIS — Z01812 Encounter for preprocedural laboratory examination: Secondary | ICD-10-CM | POA: Insufficient documentation

## 2019-02-26 DIAGNOSIS — Z20828 Contact with and (suspected) exposure to other viral communicable diseases: Secondary | ICD-10-CM | POA: Insufficient documentation

## 2019-02-27 LAB — SARS CORONAVIRUS 2 (TAT 6-24 HRS): SARS Coronavirus 2: NEGATIVE

## 2019-03-02 NOTE — Discharge Instructions (Signed)

## 2019-03-03 ENCOUNTER — Ambulatory Visit: Payer: Medicare Other | Admitting: Anesthesiology

## 2019-03-03 ENCOUNTER — Other Ambulatory Visit: Payer: Self-pay

## 2019-03-03 ENCOUNTER — Encounter
Admission: RE | Disposition: A | Discharge status: Home / Self Care | Payer: Self-pay | Source: Home / Self Care | Attending: Ophthalmology

## 2019-03-03 ENCOUNTER — Ambulatory Visit
Admission: RE | Admit: 2019-03-03 | Discharge: 2019-03-03 | Disposition: A | Discharge status: Home / Self Care | Payer: Medicare Other | Attending: Ophthalmology | Admitting: Ophthalmology

## 2019-03-03 DIAGNOSIS — H2511 Age-related nuclear cataract, right eye: Secondary | ICD-10-CM | POA: Insufficient documentation

## 2019-03-03 DIAGNOSIS — Z87891 Personal history of nicotine dependence: Secondary | ICD-10-CM | POA: Diagnosis not present

## 2019-03-03 DIAGNOSIS — F329 Major depressive disorder, single episode, unspecified: Secondary | ICD-10-CM | POA: Insufficient documentation

## 2019-03-03 DIAGNOSIS — Z79890 Hormone replacement therapy (postmenopausal): Secondary | ICD-10-CM | POA: Insufficient documentation

## 2019-03-03 DIAGNOSIS — E039 Hypothyroidism, unspecified: Secondary | ICD-10-CM | POA: Insufficient documentation

## 2019-03-03 DIAGNOSIS — H25811 Combined forms of age-related cataract, right eye: Secondary | ICD-10-CM | POA: Diagnosis not present

## 2019-03-03 HISTORY — PX: CATARACT EXTRACTION W/PHACO: SHX586

## 2019-03-03 SURGERY — PHACOEMULSIFICATION, CATARACT, WITH IOL INSERTION
Anesthesia: Monitor Anesthesia Care | Site: Eye | Laterality: Right

## 2019-03-03 MED ORDER — EPINEPHRINE PF 1 MG/ML IJ SOLN
INTRAOCULAR | Status: DC | PRN
  Administered 2019-03-03: 11:00:00 47 mL via OPHTHALMIC

## 2019-03-03 MED ORDER — MOXIFLOXACIN HCL 0.5 % OP SOLN
1.0000 [drp] | OPHTHALMIC | Status: DC | PRN
  Administered 2019-03-03 (×3): 1 [drp] via OPHTHALMIC

## 2019-03-03 MED ORDER — TETRACAINE HCL 0.5 % OP SOLN
1.0000 [drp] | OPHTHALMIC | Status: DC | PRN
  Administered 2019-03-03 (×3): 1 [drp] via OPHTHALMIC

## 2019-03-03 MED ORDER — ARMC OPHTHALMIC DILATING DROPS
1.0000 "application " | OPHTHALMIC | Status: DC | PRN
  Administered 2019-03-03 (×3): 1 via OPHTHALMIC

## 2019-03-03 MED ORDER — LACTATED RINGERS IV SOLN: INTRAVENOUS | Status: DC

## 2019-03-03 MED ORDER — CEFUROXIME OPHTHALMIC INJECTION 1 MG/0.1 ML
INJECTION | OPHTHALMIC | Status: DC | PRN
  Administered 2019-03-03: 0.1 mL via INTRACAMERAL

## 2019-03-03 MED ORDER — FENTANYL CITRATE (PF) 100 MCG/2ML IJ SOLN
INTRAMUSCULAR | Status: DC | PRN
  Administered 2019-03-03: 50 ug via INTRAVENOUS

## 2019-03-03 MED ORDER — NA HYALUR & NA CHOND-NA HYALUR 0.4-0.35 ML IO KIT
PACK | INTRAOCULAR | Status: DC | PRN
  Administered 2019-03-03: 1 mL via INTRAOCULAR

## 2019-03-03 MED ORDER — LIDOCAINE HCL (PF) 2 % IJ SOLN
INTRAOCULAR | Status: DC | PRN
  Administered 2019-03-03: 1 mL

## 2019-03-03 MED ORDER — BRIMONIDINE TARTRATE-TIMOLOL 0.2-0.5 % OP SOLN
OPHTHALMIC | Status: DC | PRN
  Administered 2019-03-03: 1 [drp] via OPHTHALMIC

## 2019-03-03 MED ORDER — MIDAZOLAM HCL 2 MG/2ML IJ SOLN
INTRAMUSCULAR | Status: DC | PRN
  Administered 2019-03-03: 1 mg via INTRAVENOUS

## 2019-03-03 SURGICAL SUPPLY — 16 items
CANNULA ANT/CHMB 27G (MISCELLANEOUS) ×1 IMPLANT
CANNULA ANT/CHMB 27GA (MISCELLANEOUS) ×3 IMPLANT
GLOVE SURG LX 7.5 STRW (GLOVE) ×2
GLOVE SURG LX STRL 7.5 STRW (GLOVE) ×1 IMPLANT
GLOVE SURG TRIUMPH 8.0 PF LTX (GLOVE) ×3 IMPLANT
GOWN STRL REUS W/ TWL LRG LVL3 (GOWN DISPOSABLE) ×2 IMPLANT
GOWN STRL REUS W/TWL LRG LVL3 (GOWN DISPOSABLE) ×4
LENS IOL TECNIS ITEC 21.5 (Intraocular Lens) ×2 IMPLANT
MARKER SKIN DUAL TIP RULER LAB (MISCELLANEOUS) ×3 IMPLANT
PACK CATARACT BRASINGTON (MISCELLANEOUS) ×3 IMPLANT
PACK EYE AFTER SURG (MISCELLANEOUS) ×3 IMPLANT
PACK OPTHALMIC (MISCELLANEOUS) ×3 IMPLANT
SYR 3ML LL SCALE MARK (SYRINGE) ×3 IMPLANT
SYR TB 1ML LUER SLIP (SYRINGE) ×3 IMPLANT
WATER STERILE IRR 500ML POUR (IV SOLUTION) ×3 IMPLANT
WIPE NON LINTING 3.25X3.25 (MISCELLANEOUS) ×3 IMPLANT

## 2019-03-03 NOTE — Op Note (Signed)
LOCATION:  [place]   PREOPERATIVE DIAGNOSIS:    Nuclear sclerotic cataract right eye. H25.11   POSTOPERATIVE DIAGNOSIS:  Nuclear sclerotic cataract right eye.     PROCEDURE:  Phacoemusification with posterior chamber intraocular lens placement of the right eye   ULTRASOUND TIME: Procedure(s): CATARACT EXTRACTION PHACO AND INTRAOCULAR LENS PLACEMENT (IOC) RIGHT  00:44.0  16.1%  7.12 (Right)  LENS:   Implant Name Type Inv. Item Serial No. Manufacturer Lot No. LRB No. Used Action  LENS IOL DIOP 21.5 - [id number] Intraocular Lens LENS IOL DIOP 21.5 [id number]  Right 1 Implanted         SURGEON:  Wyonia Hough, MD   ANESTHESIA:  Topical with tetracaine drops and 2% Xylocaine jelly, augmented with 1% preservative-free intracameral lidocaine.    COMPLICATIONS:  None.   DESCRIPTION OF PROCEDURE:  The patient was identified in the holding room and transported to the operating room and placed in the supine position under the operating microscope.  The right eye was identified as the operative eye and it was prepped and draped in the usual sterile ophthalmic fashion.   A 1 millimeter clear-corneal paracentesis was made at the 12:00 position.  0.5 ml of preservative-free 1% lidocaine was injected into the anterior chamber. The anterior chamber was filled with Viscoat viscoelastic.  A 2.4 millimeter keratome was used to make a near-clear corneal incision at the 9:00 position.  A curvilinear capsulorrhexis was made with a cystotome and capsulorrhexis forceps.  Balanced salt solution was used to hydrodissect and hydrodelineate the nucleus.   Phacoemulsification was then used in stop and chop fashion to remove the lens nucleus and epinucleus.  The remaining cortex was then removed using the irrigation and aspiration handpiece. Provisc was then placed into the capsular bag to distend it for lens placement.  A lens was then injected into the capsular bag.  The remaining  viscoelastic was aspirated.   Wounds were hydrated with balanced salt solution.  The anterior chamber was inflated to a physiologic pressure with balanced salt solution.  No wound leaks were noted. Cefuroxime 0.1 ml of a 10mg /ml solution was injected into the anterior chamber for a dose of 1 mg of intracameral antibiotic at the completion of the case.   Timolol and Brimonidine drops were applied to the eye.  The patient was taken to the recovery room in stable condition without complications of anesthesia or surgery.   Madilynne Mullan 03/03/2019, 10:59 AM

## 2019-03-03 NOTE — Anesthesia Postprocedure Evaluation (Signed)
Anesthesia Post Note  Patient: Robin Logan  Procedure(s) Performed: CATARACT EXTRACTION PHACO AND INTRAOCULAR LENS PLACEMENT (IOC) RIGHT  00:44.0  16.1%  7.12 (Right Eye)  Patient location during evaluation: PACU Anesthesia Type: MAC Level of consciousness: awake and alert and oriented Pain management: satisfactory to patient Vital Signs Assessment: post-procedure vital signs reviewed and stable Respiratory status: spontaneous breathing, nonlabored ventilation and respiratory function stable Cardiovascular status: blood pressure returned to baseline and stable Postop Assessment: Adequate PO intake and No signs of nausea or vomiting Anesthetic complications: no    Raliegh Ip

## 2019-03-03 NOTE — H&P (Signed)

## 2019-03-03 NOTE — Transfer of Care (Signed)
Immediate Anesthesia Transfer of Care Note  Patient: Robin Logan  Procedure(s) Performed: CATARACT EXTRACTION PHACO AND INTRAOCULAR LENS PLACEMENT (IOC) RIGHT  00:44.0  16.1%  7.12 (Right Eye)  Patient Location: PACU  Anesthesia Type: MAC  Level of Consciousness: awake, alert  and patient cooperative  Airway and Oxygen Therapy: Patient Spontanous Breathing and Patient connected to supplemental oxygen  Post-op Assessment: Post-op Vital signs reviewed, Patient's Cardiovascular Status Stable, Respiratory Function Stable, Patent Airway and No signs of Nausea or vomiting  Post-op Vital Signs: Reviewed and stable  Complications: No apparent anesthesia complications

## 2019-03-03 NOTE — Anesthesia Preprocedure Evaluation (Signed)
Anesthesia Evaluation  Patient identified by MRN, date of birth, ID band Patient awake    Reviewed: Allergy & Precautions, H&P , NPO status , Patient's Chart, lab work & pertinent test results  Airway Mallampati: II  TM Distance: >3 FB Neck ROM: full    Dental no notable dental hx.    Pulmonary former smoker,    Pulmonary exam normal breath sounds clear to auscultation       Cardiovascular Normal cardiovascular exam Rhythm:regular Rate:Normal     Neuro/Psych PSYCHIATRIC DISORDERS    GI/Hepatic   Endo/Other  Hypothyroidism   Renal/GU      Musculoskeletal   Abdominal   Peds  Hematology   Anesthesia Other Findings   Reproductive/Obstetrics                             Anesthesia Physical Anesthesia Plan  ASA: II  Anesthesia Plan: MAC   Post-op Pain Management:    Induction:   PONV Risk Score and Plan: 2 and Midazolam, TIVA and Treatment may vary due to age or medical condition  Airway Management Planned:   Additional Equipment:   Intra-op Plan:   Post-operative Plan:   Informed Consent: I have reviewed the patients History and Physical, chart, labs and discussed the procedure including the risks, benefits and alternatives for the proposed anesthesia with the patient or authorized representative who has indicated his/her understanding and acceptance.       Plan Discussed with: CRNA  Anesthesia Plan Comments:         Anesthesia Quick Evaluation

## 2019-03-05 ENCOUNTER — Emergency Department: Payer: Medicare Other

## 2019-03-05 ENCOUNTER — Other Ambulatory Visit: Payer: Self-pay

## 2019-03-05 ENCOUNTER — Telehealth: Payer: Self-pay | Admitting: *Deleted

## 2019-03-05 ENCOUNTER — Emergency Department
Admission: EM | Admit: 2019-03-05 | Discharge: 2019-03-05 | Disposition: A | Discharge status: Home / Self Care | Payer: Medicare Other | Attending: Emergency Medicine | Admitting: Emergency Medicine

## 2019-03-05 DIAGNOSIS — R55 Syncope and collapse: Secondary | ICD-10-CM | POA: Insufficient documentation

## 2019-03-05 DIAGNOSIS — R918 Other nonspecific abnormal finding of lung field: Secondary | ICD-10-CM | POA: Insufficient documentation

## 2019-03-05 DIAGNOSIS — R9389 Abnormal findings on diagnostic imaging of other specified body structures: Secondary | ICD-10-CM

## 2019-03-05 DIAGNOSIS — Z87891 Personal history of nicotine dependence: Secondary | ICD-10-CM | POA: Diagnosis not present

## 2019-03-05 DIAGNOSIS — E039 Hypothyroidism, unspecified: Secondary | ICD-10-CM | POA: Insufficient documentation

## 2019-03-05 LAB — CBC
HCT: 36.3 % (ref 36.0–46.0)
Hemoglobin: 11.9 g/dL — ABNORMAL LOW (ref 12.0–15.0)
MCH: 30.6 pg (ref 26.0–34.0)
MCHC: 32.8 g/dL (ref 30.0–36.0)
MCV: 93.3 fL (ref 80.0–100.0)
Platelets: 241 10*3/uL (ref 150–400)
RBC: 3.89 MIL/uL (ref 3.87–5.11)
RDW: 12.6 % (ref 11.5–15.5)
WBC: 5.5 10*3/uL (ref 4.0–10.5)
nRBC: 0 % (ref 0.0–0.2)

## 2019-03-05 LAB — BASIC METABOLIC PANEL
Anion gap: 12 (ref 5–15)
BUN: 19 mg/dL (ref 8–23)
CO2: 24 mmol/L (ref 22–32)
Calcium: 9.3 mg/dL (ref 8.9–10.3)
Chloride: 99 mmol/L (ref 98–111)
Creatinine, Ser: 0.79 mg/dL (ref 0.44–1.00)
GFR calc Af Amer: >60 mL/min (ref >60)
GFR calc non Af Amer: >60 mL/min (ref >60)
Glucose, Bld: 106 mg/dL — ABNORMAL HIGH (ref 70–99)
Potassium: 4.5 mmol/L (ref 3.5–5.1)
Sodium: 135 mmol/L (ref 135–145)

## 2019-03-05 LAB — URINALYSIS, COMPLETE (UACMP) WITH MICROSCOPIC
Bacteria, UA: NONE SEEN
Bilirubin Urine: NEGATIVE
Glucose, UA: NEGATIVE mg/dL
Hgb urine dipstick: NEGATIVE
Ketones, ur: 5 mg/dL — AB
Leukocytes,Ua: NEGATIVE
Nitrite: NEGATIVE
Protein, ur: NEGATIVE mg/dL
Specific Gravity, Urine: 1.011 (ref 1.005–1.030)
pH: 6 (ref 5.0–8.0)

## 2019-03-05 LAB — TROPONIN I (HIGH SENSITIVITY): Troponin I (High Sensitivity): 5 ng/L (ref <18)

## 2019-03-05 MED ORDER — DOXYCYCLINE HYCLATE 100 MG PO CAPS: 100.0000 mg | ORAL_CAPSULE | Freq: Two times a day (BID) | ORAL | 0 refills | Status: DC

## 2019-03-05 NOTE — ED Notes (Signed)
Pt husband to front desk to check on wait time. Informed husband that I cannot give wait time as patients are seen based on acuity.

## 2019-03-05 NOTE — Telephone Encounter (Signed)
Left another message on voicemail for patient's husband to call back.

## 2019-03-05 NOTE — ED Triage Notes (Signed)
Reports episode of near syncope and fall this AM while doing laundry. Pt states that she "caught herself in chair" on way down from fall. Pt had cataract surgery Wednesday with no complications. Denies CP or SOB. Pt alert and oriented X4, cooperative, RR even and unlabored, color WNL. Pt in NAD.

## 2019-03-05 NOTE — Telephone Encounter (Signed)
Per chart review tab pt is at [facility] ED. 

## 2019-03-05 NOTE — Telephone Encounter (Signed)
Patient's husband left a voicemail stating that he needed a call back because his wife almost just fainted. Tried to call patient's husband back and got his voicemail. Left message for him to call back ASAP.

## 2019-03-05 NOTE — ED Notes (Signed)
First Nurse Note: Pt to ED from [facility] for near syncope. Pt is in NAD.

## 2019-03-05 NOTE — Discharge Instructions (Signed)
Your lab work and other testing today to evaluate for the cause of you feeling like you might pass out was negative.  We did not find any problems with your heart.  Your chest x-ray did show a questionable opacity concerning for a developing pneumonia.  Given that you are feeling well right now with no fevers, cough, chest pain, or shortness of breath, I will provide you with a prescription for antibiotics, but you do not have to take these unless you develop the symptoms.  Please schedule follow-up with your primary care doctor, and if you have any worsening symptoms.  Return to the ER for reevaluation.

## 2019-03-05 NOTE — ED Notes (Signed)
Pt given blanket.

## 2019-03-05 NOTE — ED Provider Notes (Signed)
[facility] Emergency Department Provider Note   ____________________________________________   First MD Initiated Contact with Patient 03/05/19 2035     (approximate)  I have reviewed the triage vital signs and the nursing notes.   HISTORY  Chief Complaint Near Syncope    HPI Robin Logan is a 83 y.o. female with past medical history of thyroid disease who presents to the ED complaining of near syncope.  Patient reports that she was walking from 1 room to another when she began to feel very lightheaded and like she might pass out.  She states she nearly passed out and had to lower herself to the ground, falling against a chair.  She denies hitting her head or losing consciousness, did not have any chest pain or shortness of breath associated with the episode.  She states that she has been otherwise feeling well with no fevers, chills, cough, vomiting, or diarrhea.  She has never passed out in the past and denies any cardiac history.        Past Medical History:  Diagnosis Date  . Arthritis   . Depression   . Hyperlipidemia   . Osteopenia   . Osteopenia   . Sleep disorder   . Thyroid disease     Patient Active Problem List   Diagnosis Date Noted  . Urinary frequency 08/12/2018  . Palpitations 12/17/2017  . Dyslipidemia 01/29/2016  . Counseling regarding advanced directives and goals of care 12/07/2013  . Osteopenia   . Routine general medical examination at a health care facility 11/19/2011  . Other constipation 10/30/2010  . Hypothyroidism 05/01/2010  . HYPERLIPIDEMIA 05/01/2010  . DJD (degenerative joint disease), multiple sites 05/01/2010  . Sleep disorder 05/01/2010    Past Surgical History:  Procedure Laterality Date  . ABDOMINAL HYSTERECTOMY    . APPENDECTOMY    . CATARACT EXTRACTION W/PHACO Right 03/03/2019   Procedure: CATARACT EXTRACTION PHACO AND INTRAOCULAR LENS PLACEMENT (IOC) RIGHT  00:44.0  16.1%  7.12;  Surgeon:  Leandrew Koyanagi, MD;  Location: [place];  Service: Ophthalmology;  Laterality: Right;  . TONSILLECTOMY AND ADENOIDECTOMY  1946    Prior to Admission medications   Medication Sig Start Date End Date Taking? Authorizing Provider  Ascorbic Acid (VITAMIN C) 1000 MG tablet Take 1,000 mg by mouth daily.    [provider]  cholecalciferol (VITAMIN D) 1000 UNITS tablet Take 1,000 Units by mouth daily.      [provider]  doxycycline (VIBRAMYCIN) 100 MG capsule Take 1 capsule (100 mg total) by mouth 2 (two) times daily for 7 days. 03/05/19 03/12/19  Blake Divine, MD  ibuprofen (ADVIL,MOTRIN) 200 MG tablet Take 600 mg by mouth at bedtime as needed. For restless legs symptoms    [provider]  levothyroxine (SYNTHROID) 88 MCG tablet Take 1 tablet (88 mcg total) by mouth daily. 02/05/19   Venia Carbon, MD  mirtazapine (REMERON) 45 MG tablet TAKE 1 TABLET AT BEDTIME 02/19/19   Venia Carbon, MD  Multiple Vitamins-Minerals (ICAPS AREDS 2 PO) Take by mouth.    [provider]  senna-docusate (SENOKOT-S) 8.6-50 MG per tablet Take 2-4 tablets by mouth 2 (two) times daily.    [provider]    Allergies Patient has no known allergies.  Family History  Problem Relation Age of Onset  . Hypertension Father   . Stroke Brother   . Diabetes Brother   . Cancer Neg Hx  no breast or colon    Social History Social History   Tobacco Use  . Smoking status: Former Smoker    Quit date: 06/03/1978    Years since quitting: 40.7  . Smokeless tobacco: Never Used  Substance Use Topics  . Alcohol use: Yes    Alcohol/week: 0.0 standard drinks    Comment: wine  . Drug use: No    Review of Systems  Constitutional: No fever/chills Eyes: No visual changes. ENT: No sore throat. Cardiovascular: Denies chest pain.  Positive for near syncope. Respiratory: Denies shortness of breath. Gastrointestinal: No abdominal pain.  No nausea, no  vomiting.  No diarrhea.  No constipation. Genitourinary: Negative for dysuria. Musculoskeletal: Negative for back pain. Skin: Negative for rash. Neurological: Negative for headaches, focal weakness or numbness.  Positive for lightheadedness.  ____________________________________________   PHYSICAL EXAM:  VITAL SIGNS: ED Triage Vitals  Enc Vitals Group     BP 03/05/19 1500 (!) 148/74     Pulse Rate 03/05/19 1500 86     Resp 03/05/19 1500 14     Temp 03/05/19 1500 98 F (36.7 C)     Temp Source 03/05/19 1500 Oral     SpO2 03/05/19 1500 100 %     Weight 03/05/19 1501 126 lb (57.2 kg)     Height 03/05/19 1501 5\' 4"  (1.626 m)     Head Circumference --      Peak Flow --      Pain Score 03/05/19 1501 0     Pain Loc --      Pain Edu? --      Excl. in [place]? --     Constitutional: Alert and oriented. Eyes: Conjunctivae are normal. Head: Atraumatic. Nose: No congestion/rhinnorhea. Mouth/Throat: Mucous membranes are moist. Neck: Normal ROM Cardiovascular: Normal rate, regular rhythm. Grossly normal heart sounds. Respiratory: Normal respiratory effort.  No retractions. Lungs CTAB. Gastrointestinal: Soft and nontender. No distention. Genitourinary: deferred Musculoskeletal: No lower extremity tenderness nor edema. Neurologic:  Normal speech and language. No gross focal neurologic deficits are appreciated. Skin:  Skin is warm, dry and intact. No rash noted. Psychiatric: Mood and affect are normal. Speech and behavior are normal.  ____________________________________________   LABS (all labs ordered are listed, but only abnormal results are displayed)  Labs Reviewed  BASIC METABOLIC PANEL - Abnormal; Notable for the following components:      Result Value   Glucose, Bld 106 (*)    All other components within normal limits  CBC - Abnormal; Notable for the following components:   Hemoglobin 11.9 (*)    All other components within normal limits  URINALYSIS, COMPLETE (UACMP) WITH  MICROSCOPIC - Abnormal; Notable for the following components:   Color, Urine STRAW (*)    APPearance CLEAR (*)    Ketones, ur 5 (*)    All other components within normal limits  TROPONIN I (HIGH SENSITIVITY)  TROPONIN I (HIGH SENSITIVITY)   ____________________________________________  EKG  ED ECG REPORT I, Blake Divine, the attending physician, personally viewed and interpreted this ECG.   Date: 03/05/2019  EKG Time: 15:10  Rate: 84  Rhythm: normal sinus rhythm  Axis: Normal  Intervals:none  ST&T Change: None    PROCEDURES  Procedure(s) performed (including Critical Care):  Procedures   ____________________________________________   INITIAL IMPRESSION / ASSESSMENT AND PLAN / ED COURSE       83 year old female with history of thyroid disease presents to the ED complaining of episode of lightheadedness and near syncope.  Low  suspicion for cardiac etiology, patient did not have any associated chest pain or shortness of breath.  EKG without acute ischemic changes or evidence of arrhythmia, will continue to monitor on cardiac monitor.  Labs thus far are unremarkable, UA negative for infection.  Will screen troponin and chest x-ray, if these are unremarkable patient would be appropriate for discharge home with PCP follow-up.  Troponin within normal limits, doubt cardiac etiology of patient's near syncopal episode.  Chest x-ray does have questionable developing pneumonia.  Patient continues to deny any fevers, cough, chest pain, or shortness of breath.  Relatively low suspicion for pneumonia, will provide patient with prescription for antibiotics and counseled patient to fill the prescription if she develops any of the symptoms.  Counseled patient to follow-up with PCP and return to the ED for new or worsening symptoms, patient agrees with plan.      ____________________________________________   FINAL CLINICAL IMPRESSION(S) / ED DIAGNOSES  Final diagnoses:  Near  syncope  Abnormal chest x-ray     ED Discharge Orders         Ordered    doxycycline (VIBRAMYCIN) 100 MG capsule  2 times daily     03/05/19 2250           Note:  This document was prepared using Dragon voice recognition software and may include unintentional dictation errors.   Blake Divine, MD 03/06/19 [phone]

## 2019-03-07 NOTE — Telephone Encounter (Signed)
Evaluated and sent home from ER Please check on her

## 2019-03-08 NOTE — Telephone Encounter (Signed)
Pt sent a MyChart message and I made her an appt for 03-12-19

## 2019-03-12 ENCOUNTER — Ambulatory Visit (INDEPENDENT_AMBULATORY_CARE_PROVIDER_SITE_OTHER): Payer: Medicare Other | Admitting: Internal Medicine

## 2019-03-12 ENCOUNTER — Other Ambulatory Visit: Payer: Self-pay

## 2019-03-12 ENCOUNTER — Encounter: Payer: Self-pay | Admitting: Internal Medicine

## 2019-03-12 DIAGNOSIS — R55 Syncope and collapse: Secondary | ICD-10-CM | POA: Diagnosis not present

## 2019-03-12 NOTE — Assessment & Plan Note (Signed)
Reviewed ER records, etc No evidence of cardiovascular event or stroke Likely just brief vestibular reaction to bending over (perhaps related to the recent cataract surgery) No contraindications to proceeding with cataract removal on left Symptoms were brief--no need for meclizine at this point

## 2019-03-12 NOTE — Progress Notes (Signed)
Subjective:    Patient ID: Robin Logan, female    DOB: 02-15-1936, 83 y.o.   MRN: [id number]  HPI Here for ER follow up after near syncopal spell  Cataract extraction on right eye 9/30 Did have corneal abrasion--had discomfort with this Had trouble seeing after the numbing went away Very uncomfortable [place] and spoke to nurse---thought eye was dry and recommended artificial tears. Felt a lot better the next day Went for post op visit and he was planning ointment for the other eye  2 days after surgery (next day after post op visit) Was doing laundry--bent over to get laundry out and then walked to bedroom Had spinning sensation--vertigo Couldn't catch herself so she went down to the floor (leaning on chair) Trouble getting her balance Finally got into chair--kept head down and finally spinning stopped Husband came home---called our office but couldn't get through [place] to walk in and waited-- BP mildly elevated. Then sent to ER Records reviewed  Current Outpatient Medications on File Prior to Visit  Medication Sig Dispense Refill  . Ascorbic Acid (VITAMIN C) 1000 MG tablet Take 1,000 mg by mouth daily.    . cholecalciferol (VITAMIN D) 1000 UNITS tablet Take 1,000 Units by mouth daily.      Marland Kitchen ibuprofen (ADVIL,MOTRIN) 200 MG tablet Take 600 mg by mouth at bedtime as needed. For restless legs symptoms    . levothyroxine (SYNTHROID) 88 MCG tablet Take 1 tablet (88 mcg total) by mouth daily. 90 tablet 3  . mirtazapine (REMERON) 45 MG tablet TAKE 1 TABLET AT BEDTIME 90 tablet 3  . Multiple Vitamins-Minerals (ICAPS AREDS 2 PO) Take by mouth.    . senna-docusate (SENOKOT-S) 8.6-50 MG per tablet Take 2-4 tablets by mouth 2 (two) times daily.     No current facility-administered medications on file prior to visit.     No Known Allergies  Past Medical History:  Diagnosis Date  . Arthritis   . Depression   . Hyperlipidemia   . Osteopenia   . Osteopenia   . Sleep  disorder   . Thyroid disease     Past Surgical History:  Procedure Laterality Date  . ABDOMINAL HYSTERECTOMY    . APPENDECTOMY    . CATARACT EXTRACTION W/PHACO Right 03/03/2019   Procedure: CATARACT EXTRACTION PHACO AND INTRAOCULAR LENS PLACEMENT (IOC) RIGHT  00:44.0  16.1%  7.12;  Surgeon: Leandrew Koyanagi, MD;  Location: [place];  Service: Ophthalmology;  Laterality: Right;  . TONSILLECTOMY AND ADENOIDECTOMY  1946    Family History  Problem Relation Age of Onset  . Hypertension Father   . Stroke Brother   . Diabetes Brother   . Cancer Neg Hx        no breast or colon    Social History   Socioeconomic History  . Marital status: Married    Spouse name: Not on file  . Number of children: Not on file  . Years of education: Not on file  . Highest education level: Not on file  Occupational History  . Occupation: retired- Production manager of deeds for [employer]  . Financial resource strain: Not on file  . Food insecurity    Worry: Not on file    Inability: Not on file  . Transportation needs    Medical: Not on file    Non-medical: Not on file  Tobacco Use  . Smoking status: Former Smoker    Quit date: 06/03/1978    Years  since quitting: 40.8  . Smokeless tobacco: Never Used  Substance and Sexual Activity  . Alcohol use: Yes    Alcohol/week: 0.0 standard drinks    Comment: wine  . Drug use: No  . Sexual activity: Not on file  Lifestyle  . Physical activity    Days per week: Not on file    Minutes per session: Not on file  . Stress: Not on file  Relationships  . Social Herbalist on phone: Not on file    Gets together: Not on file    Attends religious service: Not on file    Active member of club or organization: Not on file    Attends meetings of clubs or organizations: Not on file    Relationship status: Not on file  . Intimate partner violence    Fear of current or ex partner: Not on file    Emotionally abused: Not on  file    Physically abused: Not on file    Forced sexual activity: Not on file  Other Topics Concern  . Not on file  Social History Narrative   Requests DNR --order done 12/24/10. Currently keeping it in draw, would accept resuscitation for now   Has living will   Husband is health care POA---alternate would be nieces (both sides)   No tube feedings if cognitively unaware   Review of Systems No tinnitus No loss of hearing No N/V Did go on her trip to [facility] [facility] last weekend No headaches Did have similar vertigo once before    Objective:   Physical Exam  Constitutional: She is oriented to person, place, and time. She appears well-developed. No distress.  Eyes: Pupils are equal, round, and reactive to light. EOM are normal.  No nystagmus  Neck: No thyromegaly present.  Cardiovascular: Normal rate, regular rhythm and normal heart sounds. Exam reveals no gallop.  No murmur heard. Respiratory: Effort normal and breath sounds normal. No respiratory distress. She has no wheezes. She has no rales.  Musculoskeletal:        General: No edema.  Lymphadenopathy:    She has no cervical adenopathy.  Neurological: She is alert and oriented to person, place, and time. She has normal strength. She displays no tremor. No cranial nerve deficit or sensory deficit. She exhibits normal muscle tone. She displays a negative Romberg sign. Coordination and gait normal.           Assessment & Plan:

## 2019-03-16 DIAGNOSIS — Z23 Encounter for immunization: Secondary | ICD-10-CM | POA: Diagnosis not present

## 2019-03-16 DIAGNOSIS — H2512 Age-related nuclear cataract, left eye: Secondary | ICD-10-CM | POA: Diagnosis not present

## 2019-03-17 ENCOUNTER — Other Ambulatory Visit: Payer: Self-pay

## 2019-03-17 ENCOUNTER — Encounter: Payer: Self-pay | Admitting: *Deleted

## 2019-03-17 ENCOUNTER — Telehealth: Payer: Self-pay | Admitting: Internal Medicine

## 2019-03-17 NOTE — Telephone Encounter (Signed)
Paperwork re-faxed in [facility] up front

## 2019-03-17 NOTE — Telephone Encounter (Signed)
Best number [phone] [facility] [place] called checking on surgical clearance paperwork she stated this was faxed 10/6  But will refax again today  Surgery 03/24/2019

## 2019-03-18 NOTE — Anesthesia Preprocedure Evaluation (Addendum)
Anesthesia Evaluation  Patient identified by MRN, date of birth, ID band Patient awake    Reviewed: Allergy & Precautions, NPO status , Patient's Chart, lab work & pertinent test results  History of Anesthesia Complications Negative for: history of anesthetic complications  Airway Mallampati: II  TM Distance: >3 FB Neck ROM: Full    Dental   Pulmonary former smoker,    breath sounds clear to auscultation       Cardiovascular (-) angina(-) DOE + dysrhythmias (Palpitations)  Rhythm:Regular Rate:Normal   HLD   Neuro/Psych PSYCHIATRIC DISORDERS Depression    GI/Hepatic neg GERD  ,  Endo/Other  Hypothyroidism   Renal/GU      Musculoskeletal  (+) Arthritis ,   Abdominal   Peds  Hematology   Anesthesia Other Findings   Reproductive/Obstetrics                            Anesthesia Physical Anesthesia Plan  ASA: II  Anesthesia Plan: MAC   Post-op Pain Management:    Induction: Intravenous  PONV Risk Score and Plan: 2 and TIVA and Midazolam  Airway Management Planned: Nasal Cannula  Additional Equipment:   Intra-op Plan:   Post-operative Plan:   Informed Consent: I have reviewed the patients History and Physical, chart, labs and discussed the procedure including the risks, benefits and alternatives for the proposed anesthesia with the patient or authorized representative who has indicated his/her understanding and acceptance.       Plan Discussed with: CRNA and Anesthesiologist  Anesthesia Plan Comments:         Anesthesia Quick Evaluation

## 2019-03-18 NOTE — Telephone Encounter (Signed)
Hi Mel, was this done?

## 2019-03-19 ENCOUNTER — Other Ambulatory Visit
Admission: RE | Admit: 2019-03-19 | Discharge: 2019-03-19 | Disposition: A | Discharge status: Home / Self Care | Payer: Medicare Other | Source: Ambulatory Visit | Attending: Ophthalmology | Admitting: Ophthalmology

## 2019-03-19 ENCOUNTER — Other Ambulatory Visit: Payer: Self-pay

## 2019-03-19 DIAGNOSIS — Z01812 Encounter for preprocedural laboratory examination: Secondary | ICD-10-CM | POA: Insufficient documentation

## 2019-03-19 DIAGNOSIS — Z20828 Contact with and (suspected) exposure to other viral communicable diseases: Secondary | ICD-10-CM | POA: Diagnosis not present

## 2019-03-19 LAB — SARS CORONAVIRUS 2 (TAT 6-24 HRS): SARS Coronavirus 2: NEGATIVE

## 2019-03-19 NOTE — Telephone Encounter (Signed)
Surgical clearance form faxed to [facility] to [phone] as the fax number listed on the form was not working.Marland KitchenMarland Kitchen

## 2019-03-22 NOTE — Discharge Instructions (Signed)

## 2019-03-24 ENCOUNTER — Encounter
Admission: RE | Disposition: A | Discharge status: Home / Self Care | Payer: Self-pay | Source: Home / Self Care | Attending: Ophthalmology

## 2019-03-24 ENCOUNTER — Ambulatory Visit: Payer: Medicare Other | Admitting: Anesthesiology

## 2019-03-24 ENCOUNTER — Ambulatory Visit
Admission: RE | Admit: 2019-03-24 | Discharge: 2019-03-24 | Disposition: A | Discharge status: Home / Self Care | Payer: Medicare Other | Attending: Ophthalmology | Admitting: Ophthalmology

## 2019-03-24 ENCOUNTER — Other Ambulatory Visit: Payer: Self-pay

## 2019-03-24 DIAGNOSIS — E785 Hyperlipidemia, unspecified: Secondary | ICD-10-CM | POA: Insufficient documentation

## 2019-03-24 DIAGNOSIS — H2512 Age-related nuclear cataract, left eye: Secondary | ICD-10-CM | POA: Insufficient documentation

## 2019-03-24 DIAGNOSIS — R002 Palpitations: Secondary | ICD-10-CM | POA: Insufficient documentation

## 2019-03-24 DIAGNOSIS — M1990 Unspecified osteoarthritis, unspecified site: Secondary | ICD-10-CM | POA: Diagnosis not present

## 2019-03-24 DIAGNOSIS — H9190 Unspecified hearing loss, unspecified ear: Secondary | ICD-10-CM | POA: Insufficient documentation

## 2019-03-24 DIAGNOSIS — F329 Major depressive disorder, single episode, unspecified: Secondary | ICD-10-CM | POA: Insufficient documentation

## 2019-03-24 DIAGNOSIS — Z9841 Cataract extraction status, right eye: Secondary | ICD-10-CM | POA: Diagnosis not present

## 2019-03-24 DIAGNOSIS — E039 Hypothyroidism, unspecified: Secondary | ICD-10-CM | POA: Insufficient documentation

## 2019-03-24 DIAGNOSIS — Z87891 Personal history of nicotine dependence: Secondary | ICD-10-CM | POA: Diagnosis not present

## 2019-03-24 DIAGNOSIS — Z90710 Acquired absence of both cervix and uterus: Secondary | ICD-10-CM | POA: Insufficient documentation

## 2019-03-24 DIAGNOSIS — H25812 Combined forms of age-related cataract, left eye: Secondary | ICD-10-CM | POA: Diagnosis not present

## 2019-03-24 HISTORY — PX: CATARACT EXTRACTION W/PHACO: SHX586

## 2019-03-24 SURGERY — PHACOEMULSIFICATION, CATARACT, WITH IOL INSERTION
Anesthesia: Monitor Anesthesia Care | Site: Eye | Laterality: Left

## 2019-03-24 MED ORDER — BRIMONIDINE TARTRATE-TIMOLOL 0.2-0.5 % OP SOLN
OPHTHALMIC | Status: DC | PRN
  Administered 2019-03-24: 1 [drp] via OPHTHALMIC

## 2019-03-24 MED ORDER — CEFUROXIME OPHTHALMIC INJECTION 1 MG/0.1 ML
INJECTION | OPHTHALMIC | Status: DC | PRN
  Administered 2019-03-24: 0.1 mL via INTRACAMERAL

## 2019-03-24 MED ORDER — LACTATED RINGERS IV SOLN: 100.0000 mL/h | INTRAVENOUS | Status: DC

## 2019-03-24 MED ORDER — MOXIFLOXACIN HCL 0.5 % OP SOLN
1.0000 [drp] | OPHTHALMIC | Status: DC | PRN
  Administered 2019-03-24 (×3): 1 [drp] via OPHTHALMIC

## 2019-03-24 MED ORDER — NA HYALUR & NA CHOND-NA HYALUR 0.4-0.35 ML IO KIT
PACK | INTRAOCULAR | Status: DC | PRN
  Administered 2019-03-24: 1 mL via INTRAOCULAR

## 2019-03-24 MED ORDER — LIDOCAINE HCL (PF) 2 % IJ SOLN
INTRAOCULAR | Status: DC | PRN
  Administered 2019-03-24: 1 mL

## 2019-03-24 MED ORDER — TETRACAINE HCL 0.5 % OP SOLN
1.0000 [drp] | OPHTHALMIC | Status: DC | PRN
  Administered 2019-03-24 (×3): 1 [drp] via OPHTHALMIC

## 2019-03-24 MED ORDER — ARMC OPHTHALMIC DILATING DROPS
1.0000 "application " | OPHTHALMIC | Status: DC | PRN
  Administered 2019-03-24 (×3): 1 via OPHTHALMIC

## 2019-03-24 MED ORDER — EPINEPHRINE PF 1 MG/ML IJ SOLN
INTRAOCULAR | Status: DC | PRN
  Administered 2019-03-24: 11:00:00 76 mL via OPHTHALMIC

## 2019-03-24 MED ORDER — ERYTHROMYCIN 5 MG/GM OP OINT
TOPICAL_OINTMENT | OPHTHALMIC | Status: DC | PRN
  Administered 2019-03-24: 1 via OPHTHALMIC

## 2019-03-24 MED ORDER — MIDAZOLAM HCL 2 MG/2ML IJ SOLN
INTRAMUSCULAR | Status: DC | PRN
  Administered 2019-03-24: 1 mg via INTRAVENOUS

## 2019-03-24 MED ORDER — FENTANYL CITRATE (PF) 100 MCG/2ML IJ SOLN
INTRAMUSCULAR | Status: DC | PRN
  Administered 2019-03-24: 50 ug via INTRAVENOUS

## 2019-03-24 SURGICAL SUPPLY — 16 items
CANNULA ANT/CHMB 27G (MISCELLANEOUS) ×1 IMPLANT
CANNULA ANT/CHMB 27GA (MISCELLANEOUS) ×3 IMPLANT
GLOVE SURG LX 7.5 STRW (GLOVE) ×2
GLOVE SURG LX STRL 7.5 STRW (GLOVE) ×1 IMPLANT
GLOVE SURG TRIUMPH 8.0 PF LTX (GLOVE) ×3 IMPLANT
GOWN STRL REUS W/ TWL LRG LVL3 (GOWN DISPOSABLE) ×2 IMPLANT
GOWN STRL REUS W/TWL LRG LVL3 (GOWN DISPOSABLE) ×4
LENS IOL TECNIS ITEC 24.0 (Intraocular Lens) ×2 IMPLANT
MARKER SKIN DUAL TIP RULER LAB (MISCELLANEOUS) ×3 IMPLANT
PACK CATARACT BRASINGTON (MISCELLANEOUS) ×3 IMPLANT
PACK EYE AFTER SURG (MISCELLANEOUS) ×3 IMPLANT
PACK OPTHALMIC (MISCELLANEOUS) ×3 IMPLANT
SYR 3ML LL SCALE MARK (SYRINGE) ×3 IMPLANT
SYR TB 1ML LUER SLIP (SYRINGE) ×3 IMPLANT
WATER STERILE IRR 500ML POUR (IV SOLUTION) ×3 IMPLANT
WIPE NON LINTING 3.25X3.25 (MISCELLANEOUS) ×3 IMPLANT

## 2019-03-24 NOTE — H&P (Signed)

## 2019-03-24 NOTE — Anesthesia Procedure Notes (Signed)
Procedure Name: MAC Date/Time: 03/24/2019 11:05 AM Performed by: Cameron Ali, CRNA Pre-anesthesia Checklist: Patient identified, Emergency Drugs available, Suction available, Timeout performed and Patient being monitored Patient Re-evaluated:Patient Re-evaluated prior to induction Oxygen Delivery Method: Nasal cannula Placement Confirmation: positive ETCO2

## 2019-03-24 NOTE — Op Note (Signed)
OPERATIVE NOTE  ONESTY WARDWELL [id number] 03/24/2019   PREOPERATIVE DIAGNOSIS:  Nuclear sclerotic cataract left eye. H25.12   POSTOPERATIVE DIAGNOSIS:    Nuclear sclerotic cataract left eye.     PROCEDURE:  Phacoemusification with posterior chamber intraocular lens placement of the left eye  Ultrasound time: Procedure(s): CATARACT EXTRACTION PHACO AND INTRAOCULAR LENS PLACEMENT (IOC) LEFT  01:09.4  12.5%  8.73 (Left)  LENS:   Implant Name Type Inv. Item Serial No. Manufacturer Lot No. LRB No. Used Action  LENS IOL DIOP 24.0 - [id number] Intraocular Lens LENS IOL DIOP 24.0 [id number]  Left 1 Implanted      SURGEON:  Wyonia Hough, MD   ANESTHESIA:  Topical with tetracaine drops and 2% Xylocaine jelly, augmented with 1% preservative-free intracameral lidocaine.    COMPLICATIONS:  None.   DESCRIPTION OF PROCEDURE:  The patient was identified in the holding room and transported to the operating room and placed in the supine position under the operating microscope.  The left eye was identified as the operative eye and it was prepped and draped in the usual sterile ophthalmic fashion.   A 1 millimeter clear-corneal paracentesis was made at the 1:30 position.  0.5 ml of preservative-free 1% lidocaine was injected into the anterior chamber.  The anterior chamber was filled with Viscoat viscoelastic.  A 2.4 millimeter keratome was used to make a near-clear corneal incision at the 10:30 position.  .  A curvilinear capsulorrhexis was made with a cystotome and capsulorrhexis forceps.  Balanced salt solution was used to hydrodissect and hydrodelineate the nucleus.   Phacoemulsification was then used in stop and chop fashion to remove the lens nucleus and epinucleus.  The remaining cortex was then removed using the irrigation and aspiration handpiece. Provisc was then placed into the capsular bag to distend it for lens placement.  A lens was then injected into the capsular bag.  The  remaining viscoelastic was aspirated.   Wounds were hydrated with balanced salt solution.  The anterior chamber was inflated to a physiologic pressure with balanced salt solution.  No wound leaks were noted. Cefuroxime 0.1 ml of a 10mg /ml solution was injected into the anterior chamber for a dose of 1 mg of intracameral antibiotic at the completion of the case.   Timolol and Brimonidine drops were applied to the eye.  The patient was taken to the recovery room in stable condition without complications of anesthesia or surgery.  Hampton Wixom 03/24/2019, 11:26 AM

## 2019-03-24 NOTE — Transfer of Care (Signed)
Immediate Anesthesia Transfer of Care Note  Patient: Robin Logan  Procedure(s) Performed: CATARACT EXTRACTION PHACO AND INTRAOCULAR LENS PLACEMENT (IOC) LEFT  01:09.4  12.5%  8.73 (Left Eye)  Patient Location: PACU  Anesthesia Type: MAC  Level of Consciousness: awake, alert  and patient cooperative  Airway and Oxygen Therapy: Patient Spontanous Breathing and Patient connected to supplemental oxygen  Post-op Assessment: Post-op Vital signs reviewed, Patient's Cardiovascular Status Stable, Respiratory Function Stable, Patent Airway and No signs of Nausea or vomiting  Post-op Vital Signs: Reviewed and stable  Complications: No apparent anesthesia complications

## 2019-03-24 NOTE — Anesthesia Postprocedure Evaluation (Signed)
Anesthesia Post Note  Patient: Robin Logan  Procedure(s) Performed: CATARACT EXTRACTION PHACO AND INTRAOCULAR LENS PLACEMENT (IOC) LEFT  01:09.4  12.5%  8.73 (Left Eye)  Patient location during evaluation: PACU Anesthesia Type: MAC Level of consciousness: awake and alert Pain management: pain level controlled Vital Signs Assessment: post-procedure vital signs reviewed and stable Respiratory status: spontaneous breathing, nonlabored ventilation, respiratory function stable and patient connected to nasal cannula oxygen Cardiovascular status: stable and blood pressure returned to baseline Postop Assessment: no apparent nausea or vomiting Anesthetic complications: no    Brendin Situ A  Madisan Bice

## 2019-03-25 ENCOUNTER — Encounter: Payer: Self-pay | Admitting: Ophthalmology

## 2019-06-15 DIAGNOSIS — Z23 Encounter for immunization: Secondary | ICD-10-CM | POA: Diagnosis not present

## 2019-07-13 DIAGNOSIS — Z23 Encounter for immunization: Secondary | ICD-10-CM | POA: Diagnosis not present

## 2019-09-20 DIAGNOSIS — M5136 Other intervertebral disc degeneration, lumbar region: Secondary | ICD-10-CM | POA: Diagnosis not present

## 2019-09-20 DIAGNOSIS — M5416 Radiculopathy, lumbar region: Secondary | ICD-10-CM | POA: Diagnosis not present

## 2019-09-20 DIAGNOSIS — M48062 Spinal stenosis, lumbar region with neurogenic claudication: Secondary | ICD-10-CM | POA: Diagnosis not present

## 2019-11-02 DIAGNOSIS — H40003 Preglaucoma, unspecified, bilateral: Secondary | ICD-10-CM | POA: Diagnosis not present

## 2019-11-03 ENCOUNTER — Emergency Department
Admission: EM | Admit: 2019-11-03 | Discharge: 2019-11-03 | Disposition: A | Discharge status: Home / Self Care | Payer: Medicare Other | Attending: Emergency Medicine | Admitting: Emergency Medicine

## 2019-11-03 ENCOUNTER — Emergency Department: Payer: Medicare Other

## 2019-11-03 ENCOUNTER — Encounter: Payer: Self-pay | Admitting: Emergency Medicine

## 2019-11-03 DIAGNOSIS — R29810 Facial weakness: Secondary | ICD-10-CM | POA: Insufficient documentation

## 2019-11-03 DIAGNOSIS — R200 Anesthesia of skin: Secondary | ICD-10-CM | POA: Diagnosis not present

## 2019-11-03 DIAGNOSIS — R519 Headache, unspecified: Secondary | ICD-10-CM | POA: Diagnosis not present

## 2019-11-03 DIAGNOSIS — R202 Paresthesia of skin: Secondary | ICD-10-CM | POA: Insufficient documentation

## 2019-11-03 DIAGNOSIS — R4182 Altered mental status, unspecified: Secondary | ICD-10-CM | POA: Diagnosis not present

## 2019-11-03 DIAGNOSIS — Z5321 Procedure and treatment not carried out due to patient leaving prior to being seen by health care provider: Secondary | ICD-10-CM | POA: Diagnosis not present

## 2019-11-03 LAB — DIFFERENTIAL
Abs Immature Granulocytes: 0.01 10*3/uL (ref 0.00–0.07)
Basophils Absolute: 0.1 10*3/uL (ref 0.0–0.1)
Basophils Relative: 1 %
Eosinophils Absolute: 0.3 10*3/uL (ref 0.0–0.5)
Eosinophils Relative: 4 %
Immature Granulocytes: 0 %
Lymphocytes Relative: 26 %
Lymphs Abs: 1.6 10*3/uL (ref 0.7–4.0)
Monocytes Absolute: 0.6 10*3/uL (ref 0.1–1.0)
Monocytes Relative: 10 %
Neutro Abs: 3.6 10*3/uL (ref 1.7–7.7)
Neutrophils Relative %: 59 %

## 2019-11-03 LAB — COMPREHENSIVE METABOLIC PANEL
ALT: 22 U/L (ref 0–44)
AST: 31 U/L (ref 15–41)
Albumin: 4.2 g/dL (ref 3.5–5.0)
Alkaline Phosphatase: 61 U/L (ref 38–126)
Anion gap: 10 (ref 5–15)
BUN: 33 mg/dL — ABNORMAL HIGH (ref 8–23)
CO2: 26 mmol/L (ref 22–32)
Calcium: 9.2 mg/dL (ref 8.9–10.3)
Chloride: 102 mmol/L (ref 98–111)
Creatinine, Ser: 1.24 mg/dL — ABNORMAL HIGH (ref 0.44–1.00)
GFR calc Af Amer: 47 mL/min — ABNORMAL LOW (ref >60)
GFR calc non Af Amer: 41 mL/min — ABNORMAL LOW (ref >60)
Glucose, Bld: 104 mg/dL — ABNORMAL HIGH (ref 70–99)
Potassium: 4.6 mmol/L (ref 3.5–5.1)
Sodium: 138 mmol/L (ref 135–145)
Total Bilirubin: 0.4 mg/dL (ref 0.3–1.2)
Total Protein: 7 g/dL (ref 6.5–8.1)

## 2019-11-03 LAB — CBC
HCT: 35.3 % — ABNORMAL LOW (ref 36.0–46.0)
Hemoglobin: 11.7 g/dL — ABNORMAL LOW (ref 12.0–15.0)
MCH: 31.5 pg (ref 26.0–34.0)
MCHC: 33.1 g/dL (ref 30.0–36.0)
MCV: 95.1 fL (ref 80.0–100.0)
Platelets: 224 10*3/uL (ref 150–400)
RBC: 3.71 MIL/uL — ABNORMAL LOW (ref 3.87–5.11)
RDW: 13.1 % (ref 11.5–15.5)
WBC: 6.1 10*3/uL (ref 4.0–10.5)
nRBC: 0 % (ref 0.0–0.2)

## 2019-11-03 LAB — APTT: aPTT: 33 seconds (ref 24–36)

## 2019-11-03 LAB — TROPONIN I (HIGH SENSITIVITY): Troponin I (High Sensitivity): 8 ng/L (ref <18)

## 2019-11-03 LAB — PROTIME-INR
INR: 0.9 (ref 0.8–1.2)
Prothrombin Time: 12.2 seconds (ref 11.4–15.2)

## 2019-11-03 NOTE — ED Triage Notes (Signed)
Pt reports she has had 2 episodes today of full right sided numbness and tingling. Once this AM and once after pt went to exercise. Pt has slight droop to the right side of mouth with sensation difference from the left side. Pt has no drift and grips equal.

## 2019-11-04 ENCOUNTER — Telehealth: Payer: Self-pay

## 2019-11-04 NOTE — Telephone Encounter (Signed)
On 11/03/19 pt had numbness lt side of face,hand and leg; pt went to [facility] ED and had testing but LWBS due to wait time of several hours. Pt said the [facility] nurse clinic is not open today. Pt does not have any symptoms today and wants appt with Dr Silvio Pate. Pt scheduled 30' ED FU on 11/05/19 at 11 AM. UC & ED precautions given and pt voiced understanding.

## 2019-11-04 NOTE — Telephone Encounter (Signed)
Okay I will decide if further testing is needed at the visit

## 2019-11-05 ENCOUNTER — Other Ambulatory Visit: Payer: Self-pay

## 2019-11-05 ENCOUNTER — Ambulatory Visit (INDEPENDENT_AMBULATORY_CARE_PROVIDER_SITE_OTHER): Payer: Medicare Other | Admitting: Internal Medicine

## 2019-11-05 ENCOUNTER — Encounter: Payer: Self-pay | Admitting: Internal Medicine

## 2019-11-05 VITALS — BP 122/80 | HR 75 | Temp 97.5°F | Ht 64.0 in | Wt 125.0 lb

## 2019-11-05 DIAGNOSIS — I639 Cerebral infarction, unspecified: Secondary | ICD-10-CM | POA: Insufficient documentation

## 2019-11-05 DIAGNOSIS — I679 Cerebrovascular disease, unspecified: Secondary | ICD-10-CM | POA: Diagnosis not present

## 2019-11-05 DIAGNOSIS — G459 Transient cerebral ischemic attack, unspecified: Secondary | ICD-10-CM | POA: Diagnosis not present

## 2019-11-05 DIAGNOSIS — Z8673 Personal history of transient ischemic attack (TIA), and cerebral infarction without residual deficits: Secondary | ICD-10-CM | POA: Insufficient documentation

## 2019-11-05 MED ORDER — ATORVASTATIN CALCIUM 10 MG PO TABS: 10.0000 mg | ORAL_TABLET | Freq: Every day | ORAL | 3 refills | Status: DC

## 2019-11-05 NOTE — Progress Notes (Signed)
Subjective:    Patient ID: Robin Logan, female    DOB: December 30, 1935, 84 y.o.   MRN: [id number]  HPI Here for follow up after visit to ER--didn't see doctor This visit occurred during the SARS-CoV-2 public health emergency.  Safety protocols were in place, including screening questions prior to the visit, additional usage of staff PPE, and extensive cleaning of exam room while observing appropriate contact time as indicated for disinfecting solutions.   2 days ago--getting ready for zoom yoga class Had right sided facial numbness---and numbness in right hand and toes Lasted a brief time--?less than a minute? Went away so she did her yoga class Later the hand numbness returned--lasted about a minute Went to ER --had labs and CT scan. Was hours from being seen, so just went hom  No symptoms since then No weakness No facial droop No aphasia or dysphasia  Current Outpatient Medications on File Prior to Visit  Medication Sig Dispense Refill  . Ascorbic Acid (VITAMIN C) 1000 MG tablet Take 1,000 mg by mouth daily.    . cholecalciferol (VITAMIN D) 1000 UNITS tablet Take 1,000 Units by mouth daily.      Marland Kitchen ibuprofen (ADVIL,MOTRIN) 200 MG tablet Take 600 mg by mouth at bedtime as needed. For restless legs symptoms    . levothyroxine (SYNTHROID) 88 MCG tablet Take 1 tablet (88 mcg total) by mouth daily. 90 tablet 3  . mirtazapine (REMERON) 45 MG tablet TAKE 1 TABLET AT BEDTIME 90 tablet 3  . Multiple Vitamins-Minerals (ICAPS AREDS 2 PO) Take by mouth.    . senna-docusate (SENOKOT-S) 8.6-50 MG per tablet Take 2-4 tablets by mouth 2 (two) times daily.     No current facility-administered medications on file prior to visit.    No Known Allergies  Past Medical History:  Diagnosis Date  . Arthritis   . Depression   . Hyperlipidemia   . Osteopenia   . Osteopenia   . Sleep disorder   . Thyroid disease     Past Surgical History:  Procedure Laterality Date  . ABDOMINAL HYSTERECTOMY      . APPENDECTOMY    . CATARACT EXTRACTION W/PHACO Right 03/03/2019   Procedure: CATARACT EXTRACTION PHACO AND INTRAOCULAR LENS PLACEMENT (IOC) RIGHT  00:44.0  16.1%  7.12;  Surgeon: Leandrew Koyanagi, MD;  Location: [facility];  Service: Ophthalmology;  Laterality: Right;  . CATARACT EXTRACTION W/PHACO Left 03/24/2019   Procedure: CATARACT EXTRACTION PHACO AND INTRAOCULAR LENS PLACEMENT (IOC) LEFT  01:09.4  12.5%  8.73;  Surgeon: Leandrew Koyanagi, MD;  Location: [facility];  Service: Ophthalmology;  Laterality: Left;  . TONSILLECTOMY AND ADENOIDECTOMY  1946    Family History  Problem Relation Age of Onset  . Hypertension Father   . Stroke Brother   . Diabetes Brother   . Cancer Neg Hx        no breast or colon    Social History   Socioeconomic History  . Marital status: Married    Spouse name: Not on file  . Number of children: Not on file  . Years of education: Not on file  . Highest education level: Not on file  Occupational History  . Occupation: retired- Production manager of deeds for [employer]  Tobacco Use  . Smoking status: Former Smoker    Quit date: 06/03/1978    Years since quitting: 41.4  . Smokeless tobacco: Never Used  Substance and Sexual Activity  . Alcohol use: Yes    Alcohol/week: 0.0  standard drinks    Comment: wine  . Drug use: No  . Sexual activity: Not on file  Other Topics Concern  . Not on file  Social History Narrative   Requests DNR --order done 12/24/10. Currently keeping it in draw, would accept resuscitation for now   Has living will   Husband is health care POA---alternate would be nieces (both sides)   No tube feedings if cognitively unaware   Social Determinants of Health   Financial Resource Strain:   . Difficulty of Paying Living Expenses:   Food Insecurity:   . Worried About Charity fundraiser in the Last Year:   . Arboriculturist in the Last Year:   Transportation Needs:   . Film/video editor (Medical):    Marland Kitchen Lack of Transportation (Non-Medical):   Physical Activity:   . Days of Exercise per Week:   . Minutes of Exercise per Session:   Stress:   . Feeling of Stress :   Social Connections:   . Frequency of Communication with Friends and Family:   . Frequency of Social Gatherings with Friends and Family:   . Attends Religious Services:   . Active Member of Clubs or Organizations:   . Attends Archivist Meetings:   Marland Kitchen Marital Status:   Intimate Partner Violence:   . Fear of Current or Ex-Partner:   . Emotionally Abused:   Marland Kitchen Physically Abused:   . Sexually Abused:    Review of Systems  No chest pain or SOB No change in stamina Has gone to the new fitness center No heartburn or acid symptoms Rarely takes ibuprofen---at night but only once a month or so No palpitations     Objective:   Physical Exam  Constitutional: She appears well-developed. No distress.  HENT:  Mouth/Throat: Oropharynx is clear and moist.  No oral lesions  Eyes: Pupils are equal, round, and reactive to light. EOM are normal.  Neck: No thyromegaly present.  Cardiovascular: Normal rate, regular rhythm and normal heart sounds. Exam reveals no gallop.  No murmur heard. Respiratory: Effort normal and breath sounds normal. No respiratory distress. She has no wheezes. She has no rales.  GI: Soft. There is no abdominal tenderness.  Musculoskeletal:        General: No edema.  Lymphadenopathy:    She has no cervical adenopathy.  Skin: No rash noted.  Psychiatric: She has a normal mood and affect. Her behavior is normal.           Assessment & Plan:

## 2019-11-05 NOTE — Assessment & Plan Note (Signed)
Evidence of microvascular changes on CT Discussed that some of this can be normal for age---but she may have more than typical and increases risk for dementia Will start ASA and statin

## 2019-11-05 NOTE — Assessment & Plan Note (Addendum)
Very short but concerning for neurologic event Will check carotids and echo Nothing to suggest atrial fib Will start ASA and statin for now at least BP has not been a problem--relatively low

## 2019-11-08 ENCOUNTER — Telehealth: Payer: Self-pay

## 2019-11-08 NOTE — Telephone Encounter (Signed)
I will let her know about the results and follow up with her at her scheduled visit in August. If there is something that requires sooner follow up, we will let her know

## 2019-11-08 NOTE — Telephone Encounter (Signed)
Pt is scheduled for ECHO tomorrow (11-09-19) and Carotid Doppler 11/22/19. Asking if or when she needs to follow-up with Dr Silvio Pate about the tests.

## 2019-11-09 ENCOUNTER — Telehealth: Payer: Self-pay

## 2019-11-09 ENCOUNTER — Other Ambulatory Visit: Payer: Self-pay

## 2019-11-09 ENCOUNTER — Ambulatory Visit (INDEPENDENT_AMBULATORY_CARE_PROVIDER_SITE_OTHER): Payer: Medicare Other

## 2019-11-09 DIAGNOSIS — H40003 Preglaucoma, unspecified, bilateral: Secondary | ICD-10-CM | POA: Diagnosis not present

## 2019-11-09 DIAGNOSIS — G459 Transient cerebral ischemic attack, unspecified: Secondary | ICD-10-CM

## 2019-11-09 NOTE — Telephone Encounter (Signed)
Spoke to pt

## 2019-11-09 NOTE — Telephone Encounter (Signed)
Error

## 2019-11-14 ENCOUNTER — Observation Stay
Admission: EM | Admit: 2019-11-14 | Discharge: 2019-11-15 | Disposition: A | Discharge status: Home / Self Care | Payer: Medicare Other | Attending: Internal Medicine | Admitting: Internal Medicine

## 2019-11-14 ENCOUNTER — Other Ambulatory Visit: Payer: Self-pay

## 2019-11-14 DIAGNOSIS — Z87891 Personal history of nicotine dependence: Secondary | ICD-10-CM | POA: Diagnosis not present

## 2019-11-14 DIAGNOSIS — F329 Major depressive disorder, single episode, unspecified: Secondary | ICD-10-CM | POA: Insufficient documentation

## 2019-11-14 DIAGNOSIS — M8580 Other specified disorders of bone density and structure, unspecified site: Secondary | ICD-10-CM | POA: Insufficient documentation

## 2019-11-14 DIAGNOSIS — Z20822 Contact with and (suspected) exposure to covid-19: Secondary | ICD-10-CM | POA: Insufficient documentation

## 2019-11-14 DIAGNOSIS — E785 Hyperlipidemia, unspecified: Secondary | ICD-10-CM | POA: Diagnosis not present

## 2019-11-14 DIAGNOSIS — I471 Supraventricular tachycardia, unspecified: Secondary | ICD-10-CM | POA: Diagnosis present

## 2019-11-14 DIAGNOSIS — E039 Hypothyroidism, unspecified: Secondary | ICD-10-CM | POA: Diagnosis not present

## 2019-11-14 DIAGNOSIS — Z79899 Other long term (current) drug therapy: Secondary | ICD-10-CM | POA: Diagnosis not present

## 2019-11-14 DIAGNOSIS — Z8673 Personal history of transient ischemic attack (TIA), and cerebral infarction without residual deficits: Secondary | ICD-10-CM | POA: Diagnosis present

## 2019-11-14 DIAGNOSIS — G459 Transient cerebral ischemic attack, unspecified: Secondary | ICD-10-CM | POA: Diagnosis not present

## 2019-11-14 DIAGNOSIS — G479 Sleep disorder, unspecified: Secondary | ICD-10-CM | POA: Diagnosis not present

## 2019-11-14 DIAGNOSIS — Z7982 Long term (current) use of aspirin: Secondary | ICD-10-CM | POA: Diagnosis not present

## 2019-11-14 DIAGNOSIS — M159 Polyosteoarthritis, unspecified: Secondary | ICD-10-CM | POA: Diagnosis not present

## 2019-11-14 DIAGNOSIS — Z79890 Hormone replacement therapy (postmenopausal): Secondary | ICD-10-CM | POA: Diagnosis not present

## 2019-11-14 DIAGNOSIS — R200 Anesthesia of skin: Secondary | ICD-10-CM | POA: Diagnosis present

## 2019-11-14 NOTE — ED Triage Notes (Signed)
Pt arrives [facility] from [facility] independent living w cc of r arm/hand and mouth numbness. Pt has had multiple episodes of this and has appt w cardiovascular doc scheduled this month per ems. Pt denies dizziness, shob, cp, or fevers. No pain at this time.  Pt was tachy w ems on way here (150s) then would drop ton 90s again.

## 2019-11-15 ENCOUNTER — Observation Stay: Payer: Medicare Other

## 2019-11-15 ENCOUNTER — Emergency Department: Payer: Medicare Other

## 2019-11-15 DIAGNOSIS — R202 Paresthesia of skin: Secondary | ICD-10-CM | POA: Diagnosis not present

## 2019-11-15 DIAGNOSIS — E039 Hypothyroidism, unspecified: Secondary | ICD-10-CM

## 2019-11-15 DIAGNOSIS — R200 Anesthesia of skin: Secondary | ICD-10-CM | POA: Diagnosis not present

## 2019-11-15 DIAGNOSIS — G459 Transient cerebral ischemic attack, unspecified: Secondary | ICD-10-CM

## 2019-11-15 DIAGNOSIS — I471 Supraventricular tachycardia, unspecified: Secondary | ICD-10-CM | POA: Diagnosis present

## 2019-11-15 DIAGNOSIS — I6523 Occlusion and stenosis of bilateral carotid arteries: Secondary | ICD-10-CM | POA: Diagnosis not present

## 2019-11-15 LAB — LIPID PANEL
Cholesterol: 185 mg/dL (ref 0–200)
HDL: 86 mg/dL (ref >40)
LDL Cholesterol: 91 mg/dL (ref 0–99)
Total CHOL/HDL Ratio: 2.2 RATIO
Triglycerides: 39 mg/dL (ref <150)
VLDL: 8 mg/dL (ref 0–40)

## 2019-11-15 LAB — CBC WITH DIFFERENTIAL/PLATELET
Abs Immature Granulocytes: 0.01 10*3/uL (ref 0.00–0.07)
Basophils Absolute: 0 10*3/uL (ref 0.0–0.1)
Basophils Relative: 1 %
Eosinophils Absolute: 0.3 10*3/uL (ref 0.0–0.5)
Eosinophils Relative: 5 %
HCT: 35.4 % — ABNORMAL LOW (ref 36.0–46.0)
Hemoglobin: 11.8 g/dL — ABNORMAL LOW (ref 12.0–15.0)
Immature Granulocytes: 0 %
Lymphocytes Relative: 24 %
Lymphs Abs: 1.5 10*3/uL (ref 0.7–4.0)
MCH: 31.1 pg (ref 26.0–34.0)
MCHC: 33.3 g/dL (ref 30.0–36.0)
MCV: 93.2 fL (ref 80.0–100.0)
Monocytes Absolute: 0.7 10*3/uL (ref 0.1–1.0)
Monocytes Relative: 11 %
Neutro Abs: 3.7 10*3/uL (ref 1.7–7.7)
Neutrophils Relative %: 59 %
Platelets: 252 10*3/uL (ref 150–400)
RBC: 3.8 MIL/uL — ABNORMAL LOW (ref 3.87–5.11)
RDW: 12.9 % (ref 11.5–15.5)
WBC: 6.2 10*3/uL (ref 4.0–10.5)
nRBC: 0 % (ref 0.0–0.2)

## 2019-11-15 LAB — TROPONIN I (HIGH SENSITIVITY)
Troponin I (High Sensitivity): 15 ng/L (ref <18)
Troponin I (High Sensitivity): 8 ng/L (ref <18)

## 2019-11-15 LAB — URINALYSIS, ROUTINE W REFLEX MICROSCOPIC
Bilirubin Urine: NEGATIVE
Glucose, UA: NEGATIVE mg/dL
Hgb urine dipstick: NEGATIVE
Ketones, ur: NEGATIVE mg/dL
Leukocytes,Ua: NEGATIVE
Nitrite: NEGATIVE
Protein, ur: NEGATIVE mg/dL
Specific Gravity, Urine: 1.005 (ref 1.005–1.030)
pH: 6 (ref 5.0–8.0)

## 2019-11-15 LAB — URINE DRUG SCREEN, QUALITATIVE (ARMC ONLY)
Amphetamines, Ur Screen: NOT DETECTED
Barbiturates, Ur Screen: NOT DETECTED
Benzodiazepine, Ur Scrn: NOT DETECTED
Cannabinoid 50 Ng, Ur ~~LOC~~: NOT DETECTED
Cocaine Metabolite,Ur ~~LOC~~: NOT DETECTED
MDMA (Ecstasy)Ur Screen: NOT DETECTED
Methadone Scn, Ur: NOT DETECTED
Opiate, Ur Screen: NOT DETECTED
Phencyclidine (PCP) Ur S: NOT DETECTED
Tricyclic, Ur Screen: NOT DETECTED

## 2019-11-15 LAB — HEMOGLOBIN A1C
Hgb A1c MFr Bld: 6.1 % — ABNORMAL HIGH (ref 4.8–5.6)
Mean Plasma Glucose: 128.37 mg/dL

## 2019-11-15 LAB — MAGNESIUM: Magnesium: 2 mg/dL (ref 1.7–2.4)

## 2019-11-15 LAB — COMPREHENSIVE METABOLIC PANEL
ALT: 27 U/L (ref 0–44)
AST: 33 U/L (ref 15–41)
Albumin: 4.3 g/dL (ref 3.5–5.0)
Alkaline Phosphatase: 64 U/L (ref 38–126)
Anion gap: 10 (ref 5–15)
BUN: 26 mg/dL — ABNORMAL HIGH (ref 8–23)
CO2: 25 mmol/L (ref 22–32)
Calcium: 9.3 mg/dL (ref 8.9–10.3)
Chloride: 99 mmol/L (ref 98–111)
Creatinine, Ser: 0.98 mg/dL (ref 0.44–1.00)
GFR calc Af Amer: >60 mL/min (ref >60)
GFR calc non Af Amer: 54 mL/min — ABNORMAL LOW (ref >60)
Glucose, Bld: 105 mg/dL — ABNORMAL HIGH (ref 70–99)
Potassium: 4.1 mmol/L (ref 3.5–5.1)
Sodium: 134 mmol/L — ABNORMAL LOW (ref 135–145)
Total Bilirubin: 0.7 mg/dL (ref 0.3–1.2)
Total Protein: 7.1 g/dL (ref 6.5–8.1)

## 2019-11-15 LAB — SARS CORONAVIRUS 2 BY RT PCR (HOSPITAL ORDER, PERFORMED IN ~~LOC~~ HOSPITAL LAB): SARS Coronavirus 2: NEGATIVE

## 2019-11-15 LAB — TSH: TSH: 7.397 u[IU]/mL — ABNORMAL HIGH (ref 0.350–4.500)

## 2019-11-15 LAB — PROTIME-INR
INR: 0.9 (ref 0.8–1.2)
Prothrombin Time: 11.9 seconds (ref 11.4–15.2)

## 2019-11-15 MED ORDER — CLOPIDOGREL BISULFATE 75 MG PO TABS
75.0000 mg | ORAL_TABLET | Freq: Every day | ORAL | Status: DC
  Administered 2019-11-15: 75 mg via ORAL
  Filled 2019-11-15: qty 1

## 2019-11-15 MED ORDER — ADENOSINE 6 MG/2ML IV SOLN
6.0000 mg | Freq: Once | INTRAVENOUS | Status: AC
  Administered 2019-11-15: 6 mg via INTRAVENOUS
  Filled 2019-11-15: qty 2

## 2019-11-15 MED ORDER — LACTATED RINGERS IV BOLUS: 1000.0000 mL | Freq: Once | INTRAVENOUS | Status: DC

## 2019-11-15 MED ORDER — ADENOSINE 12 MG/4ML IV SOLN
INTRAVENOUS | Status: AC
  Filled 2019-11-15: qty 4

## 2019-11-15 MED ORDER — IOHEXOL 350 MG/ML SOLN
75.0000 mL | Freq: Once | INTRAVENOUS | Status: AC | PRN
  Administered 2019-11-15: 75 mL via INTRAVENOUS

## 2019-11-15 MED ORDER — ADENOSINE 12 MG/4ML IV SOLN: 12.0000 mg | Freq: Once | INTRAVENOUS | Status: AC

## 2019-11-15 MED ORDER — ACETAMINOPHEN 650 MG RE SUPP: 650.0000 mg | RECTAL | Status: DC | PRN

## 2019-11-15 MED ORDER — SODIUM CHLORIDE 0.9 % IV SOLN: INTRAVENOUS | Status: DC

## 2019-11-15 MED ORDER — ACETAMINOPHEN 160 MG/5ML PO SOLN
650.0000 mg | ORAL | Status: DC | PRN
  Filled 2019-11-15: qty 20.3

## 2019-11-15 MED ORDER — ASPIRIN 81 MG PO CHEW
324.0000 mg | CHEWABLE_TABLET | Freq: Once | ORAL | Status: AC
  Administered 2019-11-15: 324 mg via ORAL
  Filled 2019-11-15: qty 4

## 2019-11-15 MED ORDER — LEVOTHYROXINE SODIUM 100 MCG PO TABS: 100.0000 ug | ORAL_TABLET | Freq: Every day | ORAL | Status: DC

## 2019-11-15 MED ORDER — ADENOSINE 12 MG/4ML IV SOLN
INTRAVENOUS | Status: AC
  Administered 2019-11-15: 12 mg via INTRAVENOUS
  Filled 2019-11-15: qty 4

## 2019-11-15 MED ORDER — DILTIAZEM HCL 25 MG/5ML IV SOLN
INTRAVENOUS | Status: AC
  Administered 2019-11-15: 15 mg
  Filled 2019-11-15: qty 5

## 2019-11-15 MED ORDER — STROKE: EARLY STAGES OF RECOVERY BOOK: Freq: Once | Status: DC

## 2019-11-15 MED ORDER — DILTIAZEM HCL ER COATED BEADS 180 MG PO CP24
180.0000 mg | ORAL_CAPSULE | Freq: Once | ORAL | Status: AC
  Administered 2019-11-15: 180 mg via ORAL
  Filled 2019-11-15 (×2): qty 1

## 2019-11-15 MED ORDER — ASPIRIN EC 81 MG PO TBEC
81.0000 mg | DELAYED_RELEASE_TABLET | Freq: Every day | ORAL | Status: DC
  Administered 2019-11-15: 81 mg via ORAL
  Filled 2019-11-15: qty 1

## 2019-11-15 MED ORDER — ENOXAPARIN SODIUM 40 MG/0.4ML ~~LOC~~ SOLN
40.0000 mg | SUBCUTANEOUS | Status: DC
  Administered 2019-11-15: 40 mg via SUBCUTANEOUS
  Filled 2019-11-15: qty 0.4

## 2019-11-15 MED ORDER — DILTIAZEM HCL-DEXTROSE 125-5 MG/125ML-% IV SOLN (PREMIX)
5.0000 mg/h | INTRAVENOUS | Status: DC
  Administered 2019-11-15: 10 mg/h via INTRAVENOUS

## 2019-11-15 MED ORDER — ACETAMINOPHEN 325 MG PO TABS: 650.0000 mg | ORAL_TABLET | ORAL | Status: DC | PRN

## 2019-11-15 MED ORDER — ATORVASTATIN CALCIUM 20 MG PO TABS
40.0000 mg | ORAL_TABLET | Freq: Every day | ORAL | Status: DC
  Administered 2019-11-15: 40 mg via ORAL
  Filled 2019-11-15: qty 2

## 2019-11-15 MED ORDER — LEVOTHYROXINE SODIUM 100 MCG PO TABS: 100.0000 ug | ORAL_TABLET | Freq: Every day | ORAL | 0 refills | Status: DC

## 2019-11-15 NOTE — H&P (Signed)
History and Physical    Robin Logan NKN:[id number] DOB: Oct 30, 1935 DOA: 11/14/2019  PCP: Venia Carbon, MD   Patient coming from: Home  I have personally briefly reviewed patient's old medical records in [place]  Chief Complaint: Numbness right arm and face  HPI: Robin Logan is a 84 y.o. female with medical history significant for hypothyroidism and arthritis who presents to the emergency room with her third episode of right hand numbness and numbness on the right side of the face.  She was evaluated in the emergency room several days ago but left without being seen and is currently undergoing an outpatient work-up.  Had an echocardiogram but results are still pending.  She had 2 additional episodes tonight.  Each of her 3 episodes last just a few seconds.  She denies weakness in the extremities and denies visual disturbance.  Denies headache.  States like she is back to her normal ED Course: On arrival patient was noted to be in SVT with a rate of 149.  She did not convert with adenosine but subsequently converted with Cardizem on his back to normal sinus rhythm.  Her work-up was otherwise unremarkable with negative CT head.  MRI pending at the time of decision to admit.  Review of Systems: As per HPI otherwise 10 point review of systems negative.    Past Medical History:  Diagnosis Date  . Arthritis   . Depression   . Hyperlipidemia   . Osteopenia   . Osteopenia   . Sleep disorder   . Thyroid disease     Past Surgical History:  Procedure Laterality Date  . ABDOMINAL HYSTERECTOMY    . APPENDECTOMY    . CATARACT EXTRACTION W/PHACO Right 03/03/2019   Procedure: CATARACT EXTRACTION PHACO AND INTRAOCULAR LENS PLACEMENT (IOC) RIGHT  00:44.0  16.1%  7.12;  Surgeon: Leandrew Koyanagi, MD;  Location: [facility];  Service: Ophthalmology;  Laterality: Right;  . CATARACT EXTRACTION W/PHACO Left 03/24/2019   Procedure: CATARACT EXTRACTION PHACO AND  INTRAOCULAR LENS PLACEMENT (IOC) LEFT  01:09.4  12.5%  8.73;  Surgeon: Leandrew Koyanagi, MD;  Location: [facility];  Service: Ophthalmology;  Laterality: Left;  . TONSILLECTOMY AND ADENOIDECTOMY  1946     reports that she quit smoking about 41 years ago. She has never used smokeless tobacco. She reports current alcohol use. She reports that she does not use drugs.  No Known Allergies  Family History  Problem Relation Age of Onset  . Hypertension Father   . Stroke Brother   . Diabetes Brother   . Cancer Neg Hx        no breast or colon     Prior to Admission medications   Medication Sig Start Date End Date Taking? Authorizing Provider  Ascorbic Acid (VITAMIN C) 1000 MG tablet Take 1,000 mg by mouth daily.    [provider]  aspirin EC 81 MG tablet Take 81 mg by mouth daily.    [provider]  atorvastatin (LIPITOR) 10 MG tablet Take 1 tablet (10 mg total) by mouth daily. 11/05/19   Venia Carbon, MD  cholecalciferol (VITAMIN D) 1000 UNITS tablet Take 1,000 Units by mouth daily.      [provider]  ibuprofen (ADVIL,MOTRIN) 200 MG tablet Take 600 mg by mouth at bedtime as needed. For restless legs symptoms    [provider]  levothyroxine (SYNTHROID) 88 MCG tablet Take 1 tablet (88 mcg total) by mouth daily. 02/05/19  Venia Carbon, MD  mirtazapine (REMERON) 45 MG tablet TAKE 1 TABLET AT BEDTIME 02/19/19   Venia Carbon, MD  Multiple Vitamins-Minerals (ICAPS AREDS 2 PO) Take by mouth.    [provider]  senna-docusate (SENOKOT-S) 8.6-50 MG per tablet Take 2-4 tablets by mouth 2 (two) times daily.    [provider]    Physical Exam: Vitals:   11/15/19 0215 11/15/19 0220 11/15/19 0224 11/15/19 0225  BP: 111/63 113/66  (!) 132/58  Pulse: 71 68 71 82  Resp: 18 15 17    Temp:      TempSrc:      SpO2: 98% 99% 100% 100%  Weight:      Height:         Vitals:   11/15/19 0215 11/15/19 0220 11/15/19 0224  11/15/19 0225  BP: 111/63 113/66  (!) 132/58  Pulse: 71 68 71 82  Resp: 18 15 17    Temp:      TempSrc:      SpO2: 98% 99% 100% 100%  Weight:      Height:          Constitutional: Alert and oriented x 3 . Not in any apparent distress HEENT:      Head: Normocephalic and atraumatic.         Eyes: PERLA, EOMI, Conjunctivae are normal. Sclera is non-icteric.       Mouth/Throat: Mucous membranes are moist.       Neck: Supple with no signs of meningismus. Cardiovascular: Regular rate and rhythm. No murmurs, gallops, or rubs. 2+ symmetrical distal pulses are present . No JVD. No LE edema Respiratory: Respiratory effort normal .Lungs sounds clear bilaterally. No wheezes, crackles, or rhonchi.  Gastrointestinal: Soft, non tender, and non distended with positive bowel sounds. No rebound or guarding. Genitourinary: No CVA tenderness. Musculoskeletal: Nontender with normal range of motion in all extremities. No edema, cyanosis, or erythema of extremities. Neurologic: Normal speech and language. Face is symmetric. Moving all extremities. No gross focal neurologic deficits . Skin: Skin is warm, dry.  No rash or ulcers Psychiatric: Mood and affect are normal Speech and behavior are normal   Labs on Admission: I have personally reviewed following labs and imaging studies  CBC: Recent Labs  Lab 11/14/19 2352  WBC 6.2  NEUTROABS 3.7  HGB 11.8*  HCT 35.4*  MCV 93.2  PLT 449   Basic Metabolic Panel: Recent Labs  Lab 11/14/19 2352  NA 134*  K 4.1  CL 99  CO2 25  GLUCOSE 105*  BUN 26*  CREATININE 0.98  CALCIUM 9.3  MG 2.0   GFR: Estimated Creatinine Clearance: 38.9 mL/min (by C-G formula based on SCr of 0.98 mg/dL). Liver Function Tests: Recent Labs  Lab 11/14/19 2352  AST 33  ALT 27  ALKPHOS 64  BILITOT 0.7  PROT 7.1  ALBUMIN 4.3   No results for input(s): LIPASE, AMYLASE in the last 168 hours. No results for input(s): AMMONIA in the last 168 hours. Coagulation  Profile: Recent Labs  Lab 11/14/19 2352  INR 0.9   Cardiac Enzymes: No results for input(s): CKTOTAL, CKMB, CKMBINDEX, TROPONINI in the last 168 hours. BNP (last 3 results) No results for input(s): PROBNP in the last 8760 hours. HbA1C: No results for input(s): HGBA1C in the last 72 hours. CBG: No results for input(s): GLUCAP in the last 168 hours. Lipid Profile: No results for input(s): CHOL, HDL, LDLCALC, TRIG, CHOLHDL, LDLDIRECT in the last 72 hours. Thyroid Function Tests: No results  for input(s): TSH, T4TOTAL, FREET4, T3FREE, THYROIDAB in the last 72 hours. Anemia Panel: No results for input(s): VITAMINB12, FOLATE, FERRITIN, TIBC, IRON, RETICCTPCT in the last 72 hours. Urine analysis:    Component Value Date/Time   COLORURINE STRAW (A) 03/05/2019 1503   APPEARANCEUR CLEAR (A) 03/05/2019 1503   LABSPEC 1.011 03/05/2019 1503   PHURINE 6.0 03/05/2019 1503   GLUCOSEU NEGATIVE 03/05/2019 1503   HGBUR NEGATIVE 03/05/2019 1503   BILIRUBINUR NEGATIVE 03/05/2019 1503   BILIRUBINUR Negative 08/12/2018 1402   KETONESUR 5 (A) 03/05/2019 1503   PROTEINUR NEGATIVE 03/05/2019 1503   UROBILINOGEN 0.2 08/12/2018 1402   NITRITE NEGATIVE 03/05/2019 1503   LEUKOCYTESUR NEGATIVE 03/05/2019 1503    Radiological Exams on Admission: CT Head Wo Contrast  Result Date: 11/15/2019 CLINICAL DATA:  TIA EXAM: CT HEAD WITHOUT CONTRAST TECHNIQUE: Contiguous axial images were obtained from the base of the skull through the vertex without intravenous contrast. COMPARISON:  11/03/2019 FINDINGS: Brain: Mild age related volume loss. No acute intracranial abnormality. Specifically, no hemorrhage, hydrocephalus, mass lesion, acute infarction, or significant intracranial injury. Vascular: No hyperdense vessel or unexpected calcification. Skull: No acute calvarial abnormality. Sinuses/Orbits: Visualized paranasal sinuses and mastoids clear. Orbital soft tissues unremarkable. Other: None IMPRESSION: No acute  intracranial abnormality. Electronically Signed   By: Rolm Baptise M.D.   On: 11/15/2019 01:23    EKG: Independently reviewed.   Assessment/Plan Principal Problem:   TIA (transient ischemic attack) -Patient with recurrent TIAs all similar, numbness right upper extremity and right side of lips lasting a few seconds -Follow-up outpatient echocardiogram -Follow-up MRI and carotid Doppler -Add Plavix to overlap with home aspirin for 30 days -Continue statin -Neurology consult -PT OT and speech evaluation  SVT (supraventricular tachycardia) ([place]) -Episode of SVT with rate of 150 in the emergency room converted with Cardizem x1 dose -Continuous cardiac monitoring -If recurrent will consider starting rate control agents and cardiology consult    Hypothyroidism -Continue home meds    DVT prophylaxis: Lovenox  Code Status: full code  Family Communication:  none  Disposition Plan: Back to previous home environment Consults called: Neurology Status:obs    Athena Masse MD [facility]     11/15/2019, 3:21 AM

## 2019-11-15 NOTE — Discharge Instructions (Signed)
Supraventricular Tachycardia, Adult Supraventricular tachycardia (SVT) is a kind of abnormal heartbeat. It makes your heart beat very fast and then beat at a normal speed. A normal resting heartbeat is 60-100 times a minute. This condition can make your heart beat more than 150 times a minute. Times of having a fast heartbeat (episodes) can be scary, but they are usually not dangerous. In some cases, they may lead to heart failure if:  They happen many times per day.  Last longer than a few seconds. What are the causes?  A normal heartbeat starts when an area called the sinoatrial node sends out an electrical signal. In SVT, other areas of the heart send out signals that get in the way of the signal from the sinoatrial node. What increases the risk? You are more likely to develop this condition if you are:  12-30 years old.  A woman. The following factors may make you more likely to develop this condition:  Stress.  Tiredness.  Smoking.  Stimulant drugs, such as cocaine and methamphetamine.  Alcohol.  Caffeine.  Pregnancy.  Feeling worried or nervous (anxiety). What are the signs or symptoms?  A pounding heart.  A feeling that your heart is skipping beats (palpitations).  Weakness.  Trouble getting enough air.  Pain or tightness in your chest.  Feeling like you are going to pass out (faint).  Feeling worried or nervous.  Dizziness.  Sweating.  Feeling sick to your stomach (nausea).  Passing out.  Tiredness. Sometimes, there are no symptoms. How is this treated?  Vagal nerve stimulation. Ways to do this include: ? Holding your breath and pushing, as though you are pooping (having a bowel movement). ? Massaging an area on one side of your neck. Do not try this yourself. Only a doctor should do this. If done the wrong way, it can lead to a stroke. ? Bending forward with your head between your legs. ? Coughing while bending forward with your head between  your legs. ? Closing your eyes and massaging your eyeballs. Ask a doctor how to do this.  Medicines that prevent attacks.  Medicine to stop an attack given through an IV tube at the hospital.  A small electric shock (cardioversion) that stops an attack.  Radiofrequency ablation. In this procedure, a small, thin tube (catheter) is used to send energy to the area that is causing the rapid heartbeats. If you do not have symptoms, you may not need treatment. Follow these instructions at home: Stress  Avoid things that make you feel stressed.  To deal with stress, try: ? Doing yoga or meditation, or being out in nature. ? Listening to relaxing music. ? Doing deep breathing. ? Taking steps to be healthy, such as getting lots of sleep, exercising, and eating a balanced diet. ? Talking with a mental health doctor. Lifestyle   Try to get at least 7 hours of sleep each night.  Do not use any products that contain nicotine or tobacco, such as cigarettes, e-cigarettes, and chewing tobacco. If you need help quitting, ask your doctor.  Be aware of how alcohol affects you. ? If alcohol gives you a fast heartbeat, do not drink alcohol. ? If alcohol does not seem to give you a fast heartbeat, limit alcohol use to no more than 1 drink a day for women who are not pregnant, and 2 drinks a day for men. In the [place] one drink is one of these:  12 oz of beer (355 mL).  5   oz of wine (148 mL).  1 oz of hard liquor (44 mL).  Be aware of how caffeine affects you. ? If caffeine gives you a fast heartbeat, do not eat, drink, or use anything with caffeine in it. ? If caffeine does not seem to give you a fast heartbeat, limit how much caffeine you eat, drink, or use.  Do not use stimulant drugs. If you need help quitting, ask your doctor. General instructions  Stay at a healthy weight.  Exercise regularly. Ask your doctor about good activities for you. Try one or a mixture of these: ? 150 minutes  a week of gentle exercise, like walking or yoga. ? 75 minutes a week of exercise that is very active, like running or swimming.  Do vagus nerve treatments to slow down your heartbeat as told by your doctor.  Take over-the-counter and prescription medicines only as told by your doctor.  Keep all follow-up visits as told by your doctor. This is important. Contact a doctor if:  You have a fast heartbeat more often.  Times of having a fast heartbeat last longer than before.  Home treatments to slow down your heartbeat do not help.  You have new symptoms. Get help right away if:  You have chest pain.  Your symptoms get worse.  You have trouble breathing.  Your heart beats very fast for more than 20 minutes.  You pass out. These symptoms may be an emergency. Do not wait to see if the symptoms will go away. Get medical help right away. Call your local emergency services (911 in the [place] Do not drive yourself to the hospital. Summary  SVT is a type of abnormal heart beat.  This condition can make your heart beat more than 150 times a minute.  Treatment depends on how often the condition happens and your symptoms. This information is not intended to replace advice given to you by your health care provider. Make sure you discuss any questions you have with your health care provider. Document Revised: 04/07/2018 Document Reviewed: 04/07/2018 Elsevier Patient Education  [place].     Transient Ischemic Attack  A transient ischemic attack (TIA) is a "warning stroke" that causes stroke-like symptoms that go away quickly. A TIA does not cause lasting damage to the brain. But having a TIA is a sign that you may be at risk for a stroke. Lifestyle changes and medical treatments can help prevent a stroke. It is important to know the symptoms of a TIA and what to do. Get help right away, even if your symptoms go away. The symptoms of a TIA are the same as those of a stroke. They  can happen fast, and they usually go away within minutes or hours. They can include:  Weakness or loss of feeling in your face, arm, or leg. This often happens on one side of your body.  Trouble walking.  Trouble moving your arms or legs.  Trouble talking or understanding what people are saying.  Trouble seeing.  Seeing two of one object (double vision).  Feeling dizzy.  Feeling confused.  Loss of balance or coordination.  Feeling sick to your stomach (nauseous) and throwing up (vomiting).  A very bad headache for no reason. What increases the risk? Certain things may make you more likely to have a TIA. Some of these are things that you can change, such as:  Being very overweight (obese).  Using products that contain nicotine or tobacco, such as cigarettes and e-cigarettes.  Taking birth control pills.  Not being active.  Drinking too much alcohol.  Using drugs. Other risk factors include:  Having an irregular heartbeat (atrial fibrillation).  Being African American or Hispanic.  Having had blood clots, stroke, TIA, or heart attack in the past.  Being a woman with a history of high blood pressure in pregnancy (preeclampsia).  Being over the age of 3.  Being female.  Having family history of stroke.  Having the following diseases or conditions: ? High blood pressure. ? High cholesterol. ? Diabetes. ? Heart disease. ? Sickle cell disease. ? Sleep apnea. ? Migraine headache. ? Long-term (chronic) diseases that cause soreness and swelling (inflammation). ? Disorders that affect how your blood clots. Follow these instructions at home: Medicines   Take over-the-counter and prescription medicines only as told by your doctor.  If you were told to take aspirin or another medicine to thin your blood, take it exactly as told by your doctor. ? Taking too much of the medicine can cause bleeding. ? Taking too little of the medicine may not work to treat the  problem. Eating and drinking   Eat 5 or more servings of fruits and vegetables each day.  Follow instructions from your doctor about your diet. You may need to follow a certain diet to help lower your risk of having a stroke. You may need to: ? Eat a diet that is low in fat and salt. ? Eat foods that contain a lot of fiber. ? Limit the amount of carbohydrates and sugar in your diet.  Limit alcohol intake to 1 drink a day for nonpregnant women and 2 drinks a day for men. One drink equals 12 oz of beer, 5 oz of wine, or 1 oz of hard liquor. General instructions  Keep a healthy weight.  Stay active. Try to get at least 30 minutes of activity on all or most days.  Find out if you have a condition called sleep apnea. Get treatment if needed.  Do not use any products that contain nicotine or tobacco, such as cigarettes and e-cigarettes. If you need help quitting, ask your doctor.  Do not abuse drugs.  Keep all follow-up visits as told by your doctor. This is important. Get help right away if:  You have any signs of stroke. "BE FAST" is an easy way to remember the main warning signs: ? B - Balance. Signs are dizziness, sudden trouble walking, or loss of balance. ? E - Eyes. Signs are trouble seeing or a sudden change in how you see. ? F - Face. Signs are sudden weakness or loss of feeling of the face, or the face or eyelid drooping on one side. ? A - Arms. Signs are weakness or loss of feeling in an arm. This happens suddenly and usually on one side of the body. ? S - Speech. Signs are sudden trouble speaking, slurred speech, or trouble understanding what people say. ? T - Time. Time to call emergency services. Write down what time symptoms started.  You have other signs of stroke, such as: ? A sudden, very bad headache with no known cause. ? Feeling sick to your stomach (nausea). ? Throwing up (vomiting). ? Jerky movements that you cannot control (seizure). These symptoms may be an  emergency. Do not wait to see if the symptoms will go away. Get medical help right away. Call your local emergency services (911 in the [place] Do not drive yourself to the hospital. Summary  A transient  ischemic attack (TIA) is a "warning stroke" that causes stroke-like symptoms that go away quickly.  A TIA is a medical emergency. Get help right away, even if your symptoms go away.  A TIA does not cause lasting damage to the brain.  Having a TIA is a sign that you may be at risk for a stroke. Lifestyle changes and medical treatments can help prevent a stroke. This information is not intended to replace advice given to you by your health care provider. Make sure you discuss any questions you have with your health care provider. Document Revised: 02/13/2018 Document Reviewed: 08/21/2016 Elsevier Patient Education  [place].

## 2019-11-15 NOTE — ED Notes (Signed)
ED TO INPATIENT HANDOFF REPORT  ED Nurse Name and Phone #: [phone]  S Name/Age/Gender Robin Logan 84 y.o. female Room/Bed: ED03A/ED03A  Code Status   Code Status: Full Code  Home/SNF/Other Home Patient oriented to: self, place, time and situation Is this baseline? Yes   Triage Complete: Triage complete  Chief Complaint TIA (transient ischemic attack) [G45.9]  Triage Note Pt arrives [facility] from [facility] independent living w cc of r arm/hand and mouth numbness. Pt has had multiple episodes of this and has appt w cardiovascular doc scheduled this month per ems. Pt denies dizziness, shob, cp, or fevers. No pain at this time.  Pt was tachy w ems on way here (150s) then would drop ton 90s again.    Allergies No Known Allergies  Level of Care/Admitting Diagnosis ED Disposition    ED Disposition Condition Comment   Admit  Hospital Area: [place] [[id number]]  Level of Care: Med-Surg [16]  Covid Evaluation: Confirmed COVID Negative  Diagnosis: TIA (transient ischemic attack) [469629]  Admitting Physician: Louellen Molder [phone]  Attending Physician: Louellen Molder [phone]       B Medical/Surgery History Past Medical History:  Diagnosis Date  . Arthritis   . Depression   . Hyperlipidemia   . Osteopenia   . Osteopenia   . Sleep disorder   . Thyroid disease    Past Surgical History:  Procedure Laterality Date  . ABDOMINAL HYSTERECTOMY    . APPENDECTOMY    . CATARACT EXTRACTION W/PHACO Right 03/03/2019   Procedure: CATARACT EXTRACTION PHACO AND INTRAOCULAR LENS PLACEMENT (IOC) RIGHT  00:44.0  16.1%  7.12;  Surgeon: Leandrew Koyanagi, MD;  Location: [place];  Service: Ophthalmology;  Laterality: Right;  . CATARACT EXTRACTION W/PHACO Left 03/24/2019   Procedure: CATARACT EXTRACTION PHACO AND INTRAOCULAR LENS PLACEMENT (IOC) LEFT  01:09.4  12.5%  8.73;  Surgeon: Leandrew Koyanagi, MD;  Location: [facility];   Service: Ophthalmology;  Laterality: Left;  . TONSILLECTOMY AND ADENOIDECTOMY  1944     A IV Location/Drains/Wounds Patient Lines/Drains/Airways Status    Active Line/Drains/Airways    Name Placement date Placement time Site Days   Peripheral IV 11/15/19 Right Antecubital 11/15/19  0040  Antecubital  less than 1   External Urinary Catheter 11/15/19  0039  --  less than 1          Intake/Output Last 24 hours  Intake/Output Summary (Last 24 hours) at 11/15/2019 1130 Last data filed at 11/15/2019 0917 Gross per 24 hour  Intake --  Output 800 ml  Net -800 ml    Labs/Imaging Results for orders placed or performed during the hospital encounter of 11/14/19 (from the past 48 hour(s))  CBC with Differential/Platelet     Status: Abnormal   Collection Time: 11/14/19 11:52 PM  Result Value Ref Range   WBC 6.2 4.0 - 10.5 K/uL   RBC 3.80 (L) 3.87 - 5.11 MIL/uL   Hemoglobin 11.8 (L) 12.0 - 15.0 g/dL   HCT 35.4 (L) 36 - 46 %   MCV 93.2 80.0 - 100.0 fL   MCH 31.1 26.0 - 34.0 pg   MCHC 33.3 30.0 - 36.0 g/dL   RDW 12.9 11.5 - 15.5 %   Platelets 252 150 - 400 K/uL   nRBC 0.0 0.0 - 0.2 %   Neutrophils Relative % 59 %   Neutro Abs 3.7 1.7 - 7.7 K/uL   Lymphocytes Relative 24 %   Lymphs Abs 1.5 0.7 - 4.0  K/uL   Monocytes Relative 11 %   Monocytes Absolute 0.7 0 - 1 K/uL   Eosinophils Relative 5 %   Eosinophils Absolute 0.3 0 - 0 K/uL   Basophils Relative 1 %   Basophils Absolute 0.0 0 - 0 K/uL   Immature Granulocytes 0 %   Abs Immature Granulocytes 0.01 0.00 - 0.07 K/uL    Comment: Performed at [facility], [address], [place], [place] [address]  Comprehensive metabolic panel     Status: Abnormal   Collection Time: 11/14/19 11:52 PM  Result Value Ref Range   Sodium 134 (L) 135 - 145 mmol/L   Potassium 4.1 3.5 - 5.1 mmol/L   Chloride 99 98 - 111 mmol/L   CO2 25 22 - 32 mmol/L   Glucose, Bld 105 (H) 70 - 99 mg/dL    Comment: Glucose reference range applies only to  samples taken after fasting for at least 8 hours.   BUN 26 (H) 8 - 23 mg/dL   Creatinine, Ser 0.98 0.44 - 1.00 mg/dL   Calcium 9.3 8.9 - 10.3 mg/dL   Total Protein 7.1 6.5 - 8.1 g/dL   Albumin 4.3 3.5 - 5.0 g/dL   AST 33 15 - 41 U/L   ALT 27 0 - 44 U/L   Alkaline Phosphatase 64 38 - 126 U/L   Total Bilirubin 0.7 0.3 - 1.2 mg/dL   GFR calc non Af Amer 54 (L) >60 mL/min   GFR calc Af Amer >60 >60 mL/min   Anion gap 10 5 - 15    Comment: Performed at [facility], [place], [place] [address]  Troponin I (High Sensitivity)     Status: None   Collection Time: 11/14/19 11:52 PM  Result Value Ref Range   Troponin I (High Sensitivity) 8 <18 ng/L    Comment: (NOTE) Elevated high sensitivity troponin I (hsTnI) values and significant  changes across serial measurements may suggest ACS but many other  chronic and acute conditions are known to elevate hsTnI results.  Refer to the "Links" section for chest pain algorithms and additional  guidance. Performed at [facility], [address] [place], [place] [address]   Protime-INR     Status: None   Collection Time: 11/14/19 11:52 PM  Result Value Ref Range   Prothrombin Time 11.9 11.4 - 15.2 seconds   INR 0.9 0.8 - 1.2    Comment: (NOTE) INR goal varies based on device and disease states. Performed at [facility], [address] [place], [place] [address]   Magnesium     Status: None   Collection Time: 11/14/19 11:52 PM  Result Value Ref Range   Magnesium 2.0 1.7 - 2.4 mg/dL    Comment: Performed at [facility], [address] [place], [place] [address]  SARS Coronavirus 2 by RT PCR (hospital order, performed in [facility] hospital lab) Nasopharyngeal Nasopharyngeal Swab     Status: None   Collection Time: 11/15/19 12:45 AM   Specimen: Nasopharyngeal Swab  Result Value Ref Range   SARS Coronavirus 2 NEGATIVE NEGATIVE    Comment: (NOTE) SARS-CoV-2 target nucleic acids are  NOT DETECTED.  The SARS-CoV-2 RNA is generally detectable in upper and lower respiratory specimens during the acute phase of infection. The lowest concentration of SARS-CoV-2 viral copies this assay can detect is 250 copies / mL. A negative result does not preclude SARS-CoV-2 infection and should not be used as the sole basis for  treatment or other patient management decisions.  A negative result may occur with improper specimen collection / handling, submission of specimen other than nasopharyngeal swab, presence of viral mutation(s) within the areas targeted by this assay, and inadequate number of viral copies (<250 copies / mL). A negative result must be combined with clinical observations, patient history, and epidemiological information.  Fact Sheet for Patients:   StrictlyIdeas.no  Fact Sheet for Healthcare Providers: BankingDealers.co.za  This test is not yet approved or  cleared by the [place] FDA and has been authorized for detection and/or diagnosis of SARS-CoV-2 by FDA under an Emergency Use Authorization (EUA).  This EUA will remain in effect (meaning this test can be used) for the duration of the COVID-19 declaration under Section 564(b)(1) of the Act, 21 U.S.C. section 360bbb-3(b)(1), unless the authorization is terminated or revoked sooner.  Performed at [facility], [place], [place] [address]   Troponin I (High Sensitivity)     Status: None   Collection Time: 11/15/19  3:29 AM  Result Value Ref Range   Troponin I (High Sensitivity) 15 <18 ng/L    Comment: (NOTE) Elevated high sensitivity troponin I (hsTnI) values and significant  changes across serial measurements may suggest ACS but many other  chronic and acute conditions are known to elevate hsTnI results.  Refer to the "Links" section for chest pain algorithms and additional  guidance. Performed at [facility], [address] [place], [place] [address]   Hemoglobin A1c     Status: Abnormal   Collection Time: 11/15/19  3:29 AM  Result Value Ref Range   Hgb A1c MFr Bld 6.1 (H) 4.8 - 5.6 %    Comment: (NOTE) Pre diabetes:          5.7%-6.4%  Diabetes:              >6.4%  Glycemic control for   <7.0% adults with diabetes    Mean Plasma Glucose 128.37 mg/dL    Comment: Performed at [facility] [address], [place],  [address]  Lipid panel     Status: None   Collection Time: 11/15/19  3:29 AM  Result Value Ref Range   Cholesterol 185 0 - 200 mg/dL   Triglycerides 39 <150 mg/dL   HDL 86 >40 mg/dL   Total CHOL/HDL Ratio 2.2 RATIO   VLDL 8 0 - 40 mg/dL   LDL Cholesterol 91 0 - 99 mg/dL    Comment:        Total Cholesterol/HDL:CHD Risk Coronary Heart Disease Risk Table                     Men   Women  1/2 Average Risk   3.4   3.3  Average Risk       5.0   4.4  2 X Average Risk   9.6   7.1  3 X Average Risk  23.4   11.0        Use the calculated Patient Ratio above and the CHD Risk Table to determine the patient's CHD Risk.        ATP III CLASSIFICATION (LDL):  <100     mg/dL   Optimal  100-129  mg/dL   Near or Above                    Optimal  130-159  mg/dL   Borderline  160-189  mg/dL   High  >  190     mg/dL   Very High Performed at [facility], [address] [place], [place] [address]   TSH     Status: Abnormal   Collection Time: 11/15/19  3:29 AM  Result Value Ref Range   TSH 7.397 (H) 0.350 - 4.500 uIU/mL    Comment: Performed by a 3rd Generation assay with a functional sensitivity of <=0.01 uIU/mL. Performed at [facility], [address] [place], [place] [address]   Urine Drug Screen, Qualitative     Status: None   Collection Time: 11/15/19  3:52 AM  Result Value Ref Range   Tricyclic, Ur Screen NONE DETECTED NONE DETECTED   Amphetamines, Ur Screen NONE DETECTED NONE DETECTED   MDMA (Ecstasy)Ur Screen NONE DETECTED NONE DETECTED    Cocaine Metabolite,Ur Pal[place] NONE DETECTED NONE DETECTED   Opiate, Ur Screen NONE DETECTED NONE DETECTED   Phencyclidine (PCP) Ur S NONE DETECTED NONE DETECTED   Cannabinoid 50 Ng, Ur [place] NONE DETECTED NONE DETECTED   Barbiturates, Ur Screen NONE DETECTED NONE DETECTED   Benzodiazepine, Ur Scrn NONE DETECTED NONE DETECTED   Methadone Scn, Ur NONE DETECTED NONE DETECTED    Comment: (NOTE) Tricyclics + metabolites, urine    Cutoff 1000 ng/mL Amphetamines + metabolites, urine  Cutoff 1000 ng/mL MDMA (Ecstasy), urine              Cutoff 500 ng/mL Cocaine Metabolite, urine          Cutoff 300 ng/mL Opiate + metabolites, urine        Cutoff 300 ng/mL Phencyclidine (PCP), urine         Cutoff 25 ng/mL Cannabinoid, urine                 Cutoff 50 ng/mL Barbiturates + metabolites, urine  Cutoff 200 ng/mL Benzodiazepine, urine              Cutoff 200 ng/mL Methadone, urine                   Cutoff 300 ng/mL  The urine drug screen provides only a preliminary, unconfirmed analytical test result and should not be used for non-medical purposes. Clinical consideration and professional judgment should be applied to any positive drug screen result due to possible interfering substances. A more specific alternate chemical method must be used in order to obtain a confirmed analytical result. Gas chromatography / mass spectrometry (GC/MS) is the preferred confirm atory method. Performed at [facility], [address] [place], [place] [address]   Urinalysis, Routine w reflex microscopic     Status: Abnormal   Collection Time: 11/15/19  3:52 AM  Result Value Ref Range   Color, Urine COLORLESS (A) YELLOW   APPearance CLEAR (A) CLEAR   Specific Gravity, Urine 1.005 1.005 - 1.030   pH 6.0 5.0 - 8.0   Glucose, UA NEGATIVE NEGATIVE mg/dL   Hgb urine dipstick NEGATIVE NEGATIVE   Bilirubin Urine NEGATIVE NEGATIVE   Ketones, ur NEGATIVE NEGATIVE mg/dL   Protein, ur NEGATIVE NEGATIVE mg/dL    Nitrite NEGATIVE NEGATIVE   Leukocytes,Ua NEGATIVE NEGATIVE    Comment: Performed at [facility], [place], [place] [address]   CT Head Wo Contrast  Result Date: 11/15/2019 CLINICAL DATA:  TIA EXAM: CT HEAD WITHOUT CONTRAST TECHNIQUE: Contiguous axial images were obtained from the base of the skull through the vertex without intravenous contrast. COMPARISON:  11/03/2019 FINDINGS: Brain: Mild age related volume loss.  No acute intracranial abnormality. Specifically, no hemorrhage, hydrocephalus, mass lesion, acute infarction, or significant intracranial injury. Vascular: No hyperdense vessel or unexpected calcification. Skull: No acute calvarial abnormality. Sinuses/Orbits: Visualized paranasal sinuses and mastoids clear. Orbital soft tissues unremarkable. Other: None IMPRESSION: No acute intracranial abnormality. Electronically Signed   By: Rolm Baptise M.D.   On: 11/15/2019 01:23   MR BRAIN WO CONTRAST  Result Date: 11/15/2019 CLINICAL DATA:  Right facial numbness EXAM: MRI HEAD WITHOUT CONTRAST TECHNIQUE: Multiplanar, multiecho pulse sequences of the brain and surrounding structures were obtained without intravenous contrast. COMPARISON:  None. FINDINGS: BRAIN: No acute infarct, acute hemorrhage or extra-axial collection. Multifocal white matter hyperintensity, most commonly due to chronic ischemic microangiopathy. Normal volume of CSF spaces. Multifocal chronic microhemorrhage with hemosiderin deposition over the left frontal lobe. Normal midline structures. VASCULAR: Major flow voids are preserved. SKULL AND UPPER CERVICAL SPINE: Normal calvarium and skull base. Visualized upper cervical spine and soft tissues are normal. SINUSES/ORBITS: No paranasal sinus fluid levels or advanced mucosal thickening. No mastoid or middle ear effusion. Normal orbits. IMPRESSION: 1. No acute intracranial abnormality. 2. Mild chronic small vessel disease. Electronically Signed   By: Ulyses Jarred  M.D.   On: 11/15/2019 03:25   US Carotid Bilateral (at [facility] and [facility] only)  Result Date: 11/15/2019 CLINICAL DATA:  TIA.  History of hyperlipidemia.  Former smoker. EXAM: BILATERAL CAROTID DUPLEX ULTRASOUND TECHNIQUE: Pearline Cables scale imaging, color Doppler and duplex ultrasound were performed of bilateral carotid and vertebral arteries in the neck. COMPARISON:  None. FINDINGS: Criteria: Quantification of carotid stenosis is based on velocity parameters that correlate the residual internal carotid diameter with NASCET-based stenosis levels, using the diameter of the distal internal carotid lumen as the denominator for stenosis measurement. The following velocity measurements were obtained: RIGHT ICA: 59/18 cm/sec CCA: 16/07 cm/sec SYSTOLIC ICA/CCA RATIO:  1.1 ECA: 67 cm/sec LEFT ICA: 66/23 cm/sec CCA: 37/10 cm/sec SYSTOLIC ICA/CCA RATIO:  1.2 ECA: 61 cm/sec RIGHT CAROTID ARTERY: There is a moderate to large amount of eccentric echogenic plaque within the right carotid bulb (image 16), extending to involve the origin and proximal aspects of the right internal carotid artery (image 24), not resulting in elevated peak systolic velocities within the interrogated course the right internal carotid artery to suggest a hemodynamically significant stenosis. RIGHT VERTEBRAL ARTERY:  Antegrade flow LEFT CAROTID ARTERY: Moderate amount of eccentric echogenic plaque within the left carotid bulb (image 49). There is a minimal amount of eccentric echogenic plaque involving the origin and proximal aspects of the left internal carotid artery (image 57), not resulting in elevated peak systolic velocities within the interrogated course the left internal carotid artery to suggest a hemodynamically significant stenosis. LEFT VERTEBRAL ARTERY:  Antegrade flow Note is made of a benign appearing non pathologically enlarged left cervical lymph node. IMPRESSION: Moderate to large amount of bilateral atherosclerotic plaque, right greater than  left, not resulting in a hemodynamically significant stenosis within either internal carotid artery. Electronically Signed   By: Sandi Mariscal M.D.   On: 11/15/2019 08:34    Pending Labs Unresulted Labs (From admission, onward) Comment          Start     Ordered   11/22/19 0500  Creatinine, serum  (enoxaparin (LOVENOX)    CrCl >/= 30 ml/min)  Weekly,   STAT     Comments: while on enoxaparin therapy    11/15/19 0320          Vitals/Pain Today's Vitals   11/15/19 0601  11/15/19 0715 11/15/19 0837 11/15/19 0930  BP:  (!) 127/59  116/76  Pulse: 60 63  76  Resp: 13 15  16   Temp:      TempSrc:      SpO2: 100% 100%  100%  Weight:      Height:      PainSc:   0-No pain     Isolation Precautions No active isolations  Medications Medications  lactated ringers bolus 1,000 mL (0 mLs Intravenous Hold 11/15/19 0347)  adenosine (ADENOCARD) 12 MG/4ML injection (  Not Given 11/15/19 0243)   stroke: mapping our early stages of recovery book (has no administration in time range)  acetaminophen (TYLENOL) tablet 650 mg (has no administration in time range)    Or  acetaminophen (TYLENOL) 160 MG/5ML solution 650 mg (has no administration in time range)    Or  acetaminophen (TYLENOL) suppository 650 mg (has no administration in time range)  enoxaparin (LOVENOX) injection 40 mg (40 mg Subcutaneous Given 11/15/19 0910)  0.9 %  sodium chloride infusion ( Intravenous New Bag/Given 11/15/19 0915)  aspirin EC tablet 81 mg (81 mg Oral Given 11/15/19 0910)  atorvastatin (LIPITOR) tablet 40 mg (40 mg Oral Given 11/15/19 0910)  clopidogrel (PLAVIX) tablet 75 mg (75 mg Oral Given 11/15/19 0910)  adenosine (ADENOCARD) 6 MG/2ML injection 6 mg (6 mg Intravenous Given 11/15/19 0025)  diltiazem (CARDIZEM) 25 MG/5ML injection (15 mg  Given 11/15/19 0045)  adenosine (ADENOCARD) 12 MG/4ML injection 12 mg (12 mg Intravenous Given 11/15/19 0030)  diltiazem (CARDIZEM CD) 24 hr capsule 180 mg (180 mg Oral Given 11/15/19 0330)   aspirin chewable tablet 324 mg (324 mg Oral Given 11/15/19 0230)  iohexol (OMNIPAQUE) 350 MG/ML injection 75 mL (75 mLs Intravenous Contrast Given 11/15/19 1104)    Mobility walks Low fall risk   Focused Assessments    R Recommendations: See Admitting Provider Note  Report given to:

## 2019-11-15 NOTE — ED Notes (Signed)
Pt transported to MRI 

## 2019-11-15 NOTE — ED Notes (Signed)
Pt in ultrasound

## 2019-11-15 NOTE — Progress Notes (Signed)
SLP Cancellation Note  Patient Details Name: Robin Logan MRN: [id number] DOB: 1936/05/27   Cancelled treatment:       Reason Eval/Treat Not Completed: SLP screened, no needs identified, will sign off (chart reviewed; consulted NSG then met w/ pt/husband). Pt denied any difficulty swallowing and is currently on a regular diet; tolerates swallowing pills w/ water per NSG. Pt ordered a sandwich and soup w/ SLP calling Dining. Pt conversed at conversational level w/ SLP and husband w/out deficits noted; pt and husband/NSG denied any speech-language deficits.  No further skilled ST services indicated as pt appears at her baseline. Pt agreed. NSG to reconsult if any change in status.     Orinda Kenner, [place], CCC-SLP Elenna Spratling 11/15/2019, 2:37 PM

## 2019-11-15 NOTE — ED Notes (Signed)
Pt awake and eating breakfast.

## 2019-11-15 NOTE — Discharge Summary (Signed)
Physician Discharge Summary  Robin Logan YKD:[id number] DOB: 1935/09/18 DOA: 11/14/2019  PCP: Venia Carbon, MD  Admit date: 11/14/2019 Discharge date: 11/15/2019  Admitted From: Home Disposition: Home  Recommendations for Outpatient Follow-up:  1. Follow up with PCP in 1-2 weeks 2. Follow-up with [facility] cardiology in 2 weeks  Home Health: None Equipment/Devices: None  Discharge Condition: Fair CODE STATUS: Full code Diet recommendation: Heart Healthy     Discharge Diagnoses:  Principal Problem:   TIA (transient ischemic attack)  Active Problems:   SVT (supraventricular tachycardia) (HCC)   Hypothyroidism  Brief narrative/HPI 84 year old female with history of hypothyroidism, arthritis presented to the ED with third episode of right hand numbness along with numbness in the right side of the face.  She presented to the ED several days back but left without being seen (reports having to wait for several hours).  She then went to see her PCP who obtained a head CT that was unremarkable and a 2D echo recently.  They put her on baby aspirin and statin. Patient denied any weakness of the extremities, blurred vision, headache, dizziness or near syncope. In the ED she was noted to be in SVT with heart rate of 149 did not convert with adenosine but subsequently responded to Cardizem.  Head CT negative for acute finding.  Placed on observation for suspected TIA.   Hospital course Transient ischemic attacks ([place])  no further episode.   MRI of the brain without acute stroke and shows mild chronic small vessel disease.  Recent 2D echo with normal EF and no wall motion abnormality.  Carotid ultrasound shows moderate to large amount of bilateral atherosclerotic plaque but CT angiogram of the head and neck without significant stenosis (less than 20%). Appreciate neurology consult.  No further work-up needed.  Continue baby aspirin and statin she has been recently started by her PCP.   LDL of 91.  Active problems Hypothyroidism Elevated TSH of 7.39.  On Synthroid 88 mcg for several months.  I will increase the dose to 100 mcg daily and needs follow-up TSH in 3 months.  SVT Single episode possibly associated with thyroid disease.  2D echo without significant finding.  Has been stable on telemetry overnight.  Will refer to outpatient cardiology.  Patient stable to be discharged home with outpatient PCP follow-up.  Procedure: MRI brain, carotid ultrasound, CT angiogram head and neck Consult: Neurology Disposition: Home   Discharge Instructions   Allergies as of 11/15/2019   No Known Allergies     Medication List    TAKE these medications   aspirin EC 81 MG tablet Take 81 mg by mouth daily.   atorvastatin 10 MG tablet Commonly known as: LIPITOR Take 1 tablet (10 mg total) by mouth daily.   cholecalciferol 1000 units tablet Commonly known as: VITAMIN D Take 1,000 Units by mouth daily.   ibuprofen 200 MG tablet Commonly known as: ADVIL Take 600 mg by mouth at bedtime as needed. For restless legs symptoms   ICAPS AREDS 2 PO Take by mouth.   levothyroxine 100 MCG tablet Commonly known as: SYNTHROID Take 1 tablet (100 mcg total) by mouth daily at 6 (six) AM. Start taking on: November 16, 2019 What changed:   medication strength  how much to take  when to take this   mirtazapine 45 MG tablet Commonly known as: REMERON TAKE 1 TABLET AT BEDTIME   senna-docusate 8.6-50 MG tablet Commonly known as: Senokot-S Take 2-4 tablets by mouth 2 (two) times  daily.   vitamin C 1000 MG tablet Take 1,000 mg by mouth daily.       Follow-up Information    Viviana Simpler I, MD Follow up in 1 week(s).   Specialties: Internal Medicine, Pediatrics Contact information: [place] [address] [phone]        Kate Sable, MD. Schedule an appointment as soon as possible for a visit in 2 week(s).   Specialties: Cardiology,  Radiology Contact information: [place] [address] [phone]              No Known Allergies      Procedures/Studies: CT ANGIO HEAD W OR WO CONTRAST  Result Date: 11/15/2019 CLINICAL DATA:  Recurrent right hand numbness and right facial numbness. EXAM: CT ANGIOGRAPHY HEAD AND NECK TECHNIQUE: Multidetector CT imaging of the head and neck was performed using the standard protocol during bolus administration of intravenous contrast. Multiplanar CT image reconstructions and MIPs were obtained to evaluate the vascular anatomy. Carotid stenosis measurements (when applicable) are obtained utilizing NASCET criteria, using the distal internal carotid diameter as the denominator. CONTRAST:  67mL OMNIPAQUE IOHEXOL 350 MG/ML SOLN COMPARISON:  CT, MRI and ultrasound studies same day. FINDINGS: CTA NECK FINDINGS Aortic arch: Aortic atherosclerosis. No aneurysm or dissection. Branching pattern is normal. No flow limiting origin stenosis. 20% stenosis of the subclavian artery origin. Right carotid system: Common carotid artery is tortuous but widely patent to the bifurcation region. Advanced calcified plaque at the carotid bifurcation and proximal ICA. Minimal diameter of the proximal ICA is 4 mm. No gross irregularity or ulceration. Compared to a more distal cervical ICA diameter of 5 mm, this indicates only a 20% stenosis. Cervical ICA widely patent beyond that to the skull base. Left carotid system: Common carotid artery widely patent to the bifurcation. Calcified plaque at the carotid bifurcation and ICA bulb. Minimal diameter in the ICA bulb is 4 mm. Compared to a more distal cervical ICA diameter of 5 mm, this indicates a 20% stenosis. No irregularity or ulceration. Cervical ICA widely patent beyond that. Vertebral arteries: Both vertebral artery origins show some atherosclerotic plaque but no stenosis greater than 30%. Beyond the origins, both vertebral arteries are widely patent  through the cervical region to the foramen magnum. Skeleton: Pronounced cervical spondylosis and facet osteoarthritis. Other neck: No mass or lymphadenopathy. Upper chest: No acute finding. Review of the MIP images confirms the above findings CTA HEAD FINDINGS Anterior circulation: Both internal carotid arteries are widely patent through the skull base and siphon regions. Ordinary mild siphon atherosclerotic calcification but no stenosis. The anterior and middle cerebral vessels are normal without proximal stenosis, aneurysm or vascular malformation. No large or medium vessel occlusion identified. Posterior circulation: Both vertebral arteries are widely patent through the foramen magnum to the basilar. No basilar stenosis. Posterior circulation branch vessels are normal. Venous sinuses: Patent and normal. Anatomic variants: None significant. Review of the MIP images confirms the above findings IMPRESSION: Aortic Atherosclerosis (ICD10-I70.0). Atherosclerotic disease at both carotid bifurcations and proximal internal carotid arteries. No stenosis greater than 20% on either side. No visible irregularity or ulceration. No significant finding distal to the bifurcation regions. Mild atherosclerotic plaque at both vertebral artery origins but without stenosis greater than 20%. Electronically Signed   By: Nelson Chimes M.D.   On: 11/15/2019 11:33   CT Head Wo Contrast  Result Date: 11/15/2019 CLINICAL DATA:  TIA EXAM: CT HEAD WITHOUT CONTRAST TECHNIQUE: Contiguous axial images were obtained  from the base of the skull through the vertex without intravenous contrast. COMPARISON:  11/03/2019 FINDINGS: Brain: Mild age related volume loss. No acute intracranial abnormality. Specifically, no hemorrhage, hydrocephalus, mass lesion, acute infarction, or significant intracranial injury. Vascular: No hyperdense vessel or unexpected calcification. Skull: No acute calvarial abnormality. Sinuses/Orbits: Visualized paranasal sinuses  and mastoids clear. Orbital soft tissues unremarkable. Other: None IMPRESSION: No acute intracranial abnormality. Electronically Signed   By: Rolm Baptise M.D.   On: 11/15/2019 01:23   CT HEAD WO CONTRAST  Result Date: 11/03/2019 CLINICAL DATA:  84 year old female with headache. EXAM: CT HEAD WITHOUT CONTRAST TECHNIQUE: Contiguous axial images were obtained from the base of the skull through the vertex without intravenous contrast. COMPARISON:  None. FINDINGS: Brain: Mild age-related atrophy and chronic microvascular ischemic changes. There is no acute intracranial hemorrhage. No mass effect or midline shift no extra-axial fluid collection. Vascular: No hyperdense vessel or unexpected calcification. Skull: Normal. Negative for fracture or focal lesion. Sinuses/Orbits: No acute finding. Other: None IMPRESSION: 1. No acute intracranial pathology. 2. Age-related atrophy and chronic microvascular ischemic changes. Electronically Signed   By: Anner Crete M.D.   On: 11/03/2019 21:36   CT ANGIO NECK W OR WO CONTRAST  Result Date: 11/15/2019 CLINICAL DATA:  Recurrent right hand numbness and right facial numbness. EXAM: CT ANGIOGRAPHY HEAD AND NECK TECHNIQUE: Multidetector CT imaging of the head and neck was performed using the standard protocol during bolus administration of intravenous contrast. Multiplanar CT image reconstructions and MIPs were obtained to evaluate the vascular anatomy. Carotid stenosis measurements (when applicable) are obtained utilizing NASCET criteria, using the distal internal carotid diameter as the denominator. CONTRAST:  49mL OMNIPAQUE IOHEXOL 350 MG/ML SOLN COMPARISON:  CT, MRI and ultrasound studies same day. FINDINGS: CTA NECK FINDINGS Aortic arch: Aortic atherosclerosis. No aneurysm or dissection. Branching pattern is normal. No flow limiting origin stenosis. 20% stenosis of the subclavian artery origin. Right carotid system: Common carotid artery is tortuous but widely patent to  the bifurcation region. Advanced calcified plaque at the carotid bifurcation and proximal ICA. Minimal diameter of the proximal ICA is 4 mm. No gross irregularity or ulceration. Compared to a more distal cervical ICA diameter of 5 mm, this indicates only a 20% stenosis. Cervical ICA widely patent beyond that to the skull base. Left carotid system: Common carotid artery widely patent to the bifurcation. Calcified plaque at the carotid bifurcation and ICA bulb. Minimal diameter in the ICA bulb is 4 mm. Compared to a more distal cervical ICA diameter of 5 mm, this indicates a 20% stenosis. No irregularity or ulceration. Cervical ICA widely patent beyond that. Vertebral arteries: Both vertebral artery origins show some atherosclerotic plaque but no stenosis greater than 30%. Beyond the origins, both vertebral arteries are widely patent through the cervical region to the foramen magnum. Skeleton: Pronounced cervical spondylosis and facet osteoarthritis. Other neck: No mass or lymphadenopathy. Upper chest: No acute finding. Review of the MIP images confirms the above findings CTA HEAD FINDINGS Anterior circulation: Both internal carotid arteries are widely patent through the skull base and siphon regions. Ordinary mild siphon atherosclerotic calcification but no stenosis. The anterior and middle cerebral vessels are normal without proximal stenosis, aneurysm or vascular malformation. No large or medium vessel occlusion identified. Posterior circulation: Both vertebral arteries are widely patent through the foramen magnum to the basilar. No basilar stenosis. Posterior circulation branch vessels are normal. Venous sinuses: Patent and normal. Anatomic variants: None significant. Review of the MIP images confirms the  above findings IMPRESSION: Aortic Atherosclerosis (ICD10-I70.0). Atherosclerotic disease at both carotid bifurcations and proximal internal carotid arteries. No stenosis greater than 20% on either side. No  visible irregularity or ulceration. No significant finding distal to the bifurcation regions. Mild atherosclerotic plaque at both vertebral artery origins but without stenosis greater than 20%. Electronically Signed   By: Nelson Chimes M.D.   On: 11/15/2019 11:33   MR BRAIN WO CONTRAST  Result Date: 11/15/2019 CLINICAL DATA:  Right facial numbness EXAM: MRI HEAD WITHOUT CONTRAST TECHNIQUE: Multiplanar, multiecho pulse sequences of the brain and surrounding structures were obtained without intravenous contrast. COMPARISON:  None. FINDINGS: BRAIN: No acute infarct, acute hemorrhage or extra-axial collection. Multifocal white matter hyperintensity, most commonly due to chronic ischemic microangiopathy. Normal volume of CSF spaces. Multifocal chronic microhemorrhage with hemosiderin deposition over the left frontal lobe. Normal midline structures. VASCULAR: Major flow voids are preserved. SKULL AND UPPER CERVICAL SPINE: Normal calvarium and skull base. Visualized upper cervical spine and soft tissues are normal. SINUSES/ORBITS: No paranasal sinus fluid levels or advanced mucosal thickening. No mastoid or middle ear effusion. Normal orbits. IMPRESSION: 1. No acute intracranial abnormality. 2. Mild chronic small vessel disease. Electronically Signed   By: Ulyses Jarred M.D.   On: 11/15/2019 03:25   US Carotid Bilateral (at [facility] and [facility] only)  Result Date: 11/15/2019 CLINICAL DATA:  TIA.  History of hyperlipidemia.  Former smoker. EXAM: BILATERAL CAROTID DUPLEX ULTRASOUND TECHNIQUE: Pearline Cables scale imaging, color Doppler and duplex ultrasound were performed of bilateral carotid and vertebral arteries in the neck. COMPARISON:  None. FINDINGS: Criteria: Quantification of carotid stenosis is based on velocity parameters that correlate the residual internal carotid diameter with NASCET-based stenosis levels, using the diameter of the distal internal carotid lumen as the denominator for stenosis measurement. The following  velocity measurements were obtained: RIGHT ICA: 59/18 cm/sec CCA: 63/87 cm/sec SYSTOLIC ICA/CCA RATIO:  1.1 ECA: 67 cm/sec LEFT ICA: 66/23 cm/sec CCA: 56/43 cm/sec SYSTOLIC ICA/CCA RATIO:  1.2 ECA: 61 cm/sec RIGHT CAROTID ARTERY: There is a moderate to large amount of eccentric echogenic plaque within the right carotid bulb (image 16), extending to involve the origin and proximal aspects of the right internal carotid artery (image 24), not resulting in elevated peak systolic velocities within the interrogated course the right internal carotid artery to suggest a hemodynamically significant stenosis. RIGHT VERTEBRAL ARTERY:  Antegrade flow LEFT CAROTID ARTERY: Moderate amount of eccentric echogenic plaque within the left carotid bulb (image 49). There is a minimal amount of eccentric echogenic plaque involving the origin and proximal aspects of the left internal carotid artery (image 57), not resulting in elevated peak systolic velocities within the interrogated course the left internal carotid artery to suggest a hemodynamically significant stenosis. LEFT VERTEBRAL ARTERY:  Antegrade flow Note is made of a benign appearing non pathologically enlarged left cervical lymph node. IMPRESSION: Moderate to large amount of bilateral atherosclerotic plaque, right greater than left, not resulting in a hemodynamically significant stenosis within either internal carotid artery. Electronically Signed   By: Sandi Mariscal M.D.   On: 11/15/2019 08:34   ECHOCARDIOGRAM COMPLETE  Result Date: 11/09/2019    ECHOCARDIOGRAM REPORT   Patient Name:   Robin Logan Date of Exam: 11/09/2019 Medical Rec #:  [id number]        Height:       64.0 in Accession #:    [id number]       Weight:       125.0 lb Date of Birth:  1935-12-21        BSA:          1.602 m Patient Age:    71 years         BP:           128/82 mmHg Patient Gender: F                HR:           72 bpm. Exam Location:  [place] Procedure: 2D Echo, Cardiac Doppler and Color  Doppler Indications:    G45.9 TIA  History:        Patient has no prior history of Echocardiogram examinations.                 TIA; Risk Factors:Dyslipidemia, Former Smoker and family h/o                 CVA.  Sonographer:    Pilar Jarvis RDMS, RVT, RDCS Referring Phys: [place]  Sonographer Comments: TDS with very limited windows. IMPRESSIONS  1. Left ventricular ejection fraction, by estimation, is 60 to 65%. The left ventricle has normal function. The left ventricle has no regional wall motion abnormalities. Left ventricular diastolic parameters are indeterminate.  2. Right ventricular systolic function is normal. The right ventricular size is normal. There is mildly elevated pulmonary artery systolic pressure.  3. The mitral valve is grossly normal. Mild mitral valve regurgitation.  4. Tricuspid valve regurgitation is moderate.  5. The aortic valve was not well visualized. Aortic valve regurgitation is not visualized. No aortic stenosis is present.  6. Pulmonic valve regurgitation not well assessed.  7. The inferior vena cava is normal in size with <50% respiratory variability, suggesting right atrial pressure of 8 mmHg. FINDINGS  Left Ventricle: Left ventricular ejection fraction, by estimation, is 60 to 65%. The left ventricle has normal function. The left ventricle has no regional wall motion abnormalities. The left ventricular internal cavity size was normal in size. There is  no left ventricular hypertrophy. Left ventricular diastolic parameters are indeterminate. Right Ventricle: The right ventricular size is normal. No increase in right ventricular wall thickness. Right ventricular systolic function is normal. There is mildly elevated pulmonary artery systolic pressure. The tricuspid regurgitant velocity is 2.85  m/s, and with an assumed right atrial pressure of 8 mmHg, the estimated right ventricular systolic pressure is 47.8 mmHg. Left Atrium: Left atrial size was normal in size. Right  Atrium: Right atrial size was normal in size. Pericardium: There is no evidence of pericardial effusion. Mitral Valve: The mitral valve is grossly normal. Mild mitral valve regurgitation. Tricuspid Valve: The tricuspid valve is normal in structure. Tricuspid valve regurgitation is moderate. Aortic Valve: The aortic valve was not well visualized. Aortic valve regurgitation is not visualized. No aortic stenosis is present. Aortic valve mean gradient measures 2.0 mmHg. Aortic valve peak gradient measures 4.2 mmHg. Aortic valve area, by VTI measures 1.98 cm. Pulmonic Valve: The pulmonic valve was not well visualized. Pulmonic valve regurgitation not well assessed. Aorta: The aortic root is normal in size and structure. Pulmonary Artery: The pulmonary artery is not well seen. Venous: The inferior vena cava is normal in size with less than 50% respiratory variability, suggesting right atrial pressure of 8 mmHg. IAS/Shunts: No atrial level shunt detected by color flow Doppler.  LEFT VENTRICLE PLAX 2D LVIDd:         4.00 cm     Diastology LVIDs:  2.70 cm     LV e' lateral:   7.94 cm/s LV PW:         0.70 cm     LV E/e' lateral: 8.5 LV IVS:        0.80 cm     LV e' medial:    6.85 cm/s LVOT diam:     1.70 cm     LV E/e' medial:  9.9 LV SV:         41 LV SV Index:   26 LVOT Area:     2.27 cm  LV Volumes (MOD) LV vol d, MOD A4C: 59.6 ml LV vol s, MOD A4C: 20.0 ml LV SV MOD A4C:     59.6 ml RIGHT VENTRICLE             IVC RV Basal diam:  3.40 cm     IVC diam: 1.65 cm RV Mid diam:    2.90 cm RV S prime:     10.20 cm/s TAPSE (M-mode): 2.4 cm LEFT ATRIUM           Index       RIGHT ATRIUM           Index LA diam:      2.40 cm 1.50 cm/m  RA Area:     14.19 cm 8.86 cm/m LA Vol (A4C): 45.5 ml 28.40 ml/m  AORTIC VALVE AV Area (Vmax):    2.02 cm AV Area (Vmean):   2.04 cm AV Area (VTI):     1.98 cm AV Vmax:           102.00 cm/s AV Vmean:          65.000 cm/s AV VTI:            0.207 m AV Peak Grad:      4.2 mmHg AV  Mean Grad:      2.0 mmHg LVOT Vmax:         90.70 cm/s LVOT Vmean:        58.500 cm/s LVOT VTI:          0.181 m LVOT/AV VTI ratio: 0.87  AORTA Ao Root diam: 2.70 cm Ao Arch diam: 2.3 cm MITRAL VALVE               TRICUSPID VALVE MV Area (PHT): 4.77 cm    TR Peak grad:   32.5 mmHg MV Decel Time: 159 msec    TR Vmax:        285.00 cm/s MV E velocity: 67.70 cm/s MV A velocity: 70.70 cm/s  SHUNTS MV E/A ratio:  0.96        Systemic VTI:  0.18 m                            Systemic Diam: 1.70 cm Nelva Bush MD Electronically signed by Nelva Bush MD Signature Date/Time: 11/09/2019/7:02:06 PM    Final        Subjective: Seen and examined.  Denies any numbness or weakness.  Heart rate stable on the monitor.  Discharge Exam: Vitals:   11/15/19 1131 11/15/19 1226  BP: 116/68 105/61  Pulse: 68 74  Resp: 14 17  Temp:  98.4 F (36.9 C)  SpO2: 96% 99%   Vitals:   11/15/19 0930 11/15/19 1045 11/15/19 1131 11/15/19 1226  BP: 116/76 (!) 109/54 116/68 105/61  Pulse: 76 72 68 74  Resp: 16 15 14 17   Temp:  98.4 F (36.9 C)  TempSrc:    Oral  SpO2: 100% 100% 96% 99%  Weight:      Height:        General: Elderly female not in distress HEENT: Moist mucosa, supple neck Chest: Clear CVs: Normal S1-S2, no murmur rub or gallop GI: Soft, nondistended, nontender Musculoskeletal: Warm, no edema CNS: Alert and oriented, nonfocal deficit    The results of significant diagnostics from this hospitalization (including imaging, microbiology, ancillary and laboratory) are listed below for reference.     Microbiology: Recent Results (from the past 240 hour(s))  SARS Coronavirus 2 by RT PCR (hospital order, performed in [facility] hospital lab) Nasopharyngeal Nasopharyngeal Swab     Status: None   Collection Time: 11/15/19 12:45 AM   Specimen: Nasopharyngeal Swab  Result Value Ref Range Status   SARS Coronavirus 2 NEGATIVE NEGATIVE Final    Comment: (NOTE) SARS-CoV-2 target nucleic acids are  NOT DETECTED.  The SARS-CoV-2 RNA is generally detectable in upper and lower respiratory specimens during the acute phase of infection. The lowest concentration of SARS-CoV-2 viral copies this assay can detect is 250 copies / mL. A negative result does not preclude SARS-CoV-2 infection and should not be used as the sole basis for treatment or other patient management decisions.  A negative result may occur with improper specimen collection / handling, submission of specimen other than nasopharyngeal swab, presence of viral mutation(s) within the areas targeted by this assay, and inadequate number of viral copies (<250 copies / mL). A negative result must be combined with clinical observations, patient history, and epidemiological information.  Fact Sheet for Patients:   StrictlyIdeas.no  Fact Sheet for Healthcare Providers: BankingDealers.co.za  This test is not yet approved or  cleared by the [place] FDA and has been authorized for detection and/or diagnosis of SARS-CoV-2 by FDA under an Emergency Use Authorization (EUA).  This EUA will remain in effect (meaning this test can be used) for the duration of the COVID-19 declaration under Section 564(b)(1) of the Act, 21 U.S.C. section 360bbb-3(b)(1), unless the authorization is terminated or revoked sooner.  Performed at [facility], [address] [place], [place] [address]      Labs: BNP (last 3 results) No results for input(s): BNP in the last 8760 hours. Basic Metabolic Panel: Recent Labs  Lab 11/14/19 2352  NA 134*  K 4.1  CL 99  CO2 25  GLUCOSE 105*  BUN 26*  CREATININE 0.98  CALCIUM 9.3  MG 2.0   Liver Function Tests: Recent Labs  Lab 11/14/19 2352  AST 33  ALT 27  ALKPHOS 64  BILITOT 0.7  PROT 7.1  ALBUMIN 4.3   No results for input(s): LIPASE, AMYLASE in the last 168 hours. No results for input(s): AMMONIA in the last 168  hours. CBC: Recent Labs  Lab 11/14/19 2352  WBC 6.2  NEUTROABS 3.7  HGB 11.8*  HCT 35.4*  MCV 93.2  PLT 252   Cardiac Enzymes: No results for input(s): CKTOTAL, CKMB, CKMBINDEX, TROPONINI in the last 168 hours. BNP: Invalid input(s): POCBNP CBG: No results for input(s): GLUCAP in the last 168 hours. D-Dimer No results for input(s): DDIMER in the last 72 hours. Hgb A1c Recent Labs    11/15/19 0329  HGBA1C 6.1*   Lipid Profile Recent Labs    11/15/19 0329  CHOL 185  HDL 86  LDLCALC 91  TRIG 39  CHOLHDL 2.2   Thyroid function studies Recent Labs  11/15/19 0329  TSH 7.397*   Anemia work up No results for input(s): VITAMINB12, FOLATE, FERRITIN, TIBC, IRON, RETICCTPCT in the last 72 hours. Urinalysis    Component Value Date/Time   COLORURINE COLORLESS (A) 11/15/2019 0352   APPEARANCEUR CLEAR (A) 11/15/2019 0352   LABSPEC 1.005 11/15/2019 0352   PHURINE 6.0 11/15/2019 0352   GLUCOSEU NEGATIVE 11/15/2019 0352   HGBUR NEGATIVE 11/15/2019 0352   BILIRUBINUR NEGATIVE 11/15/2019 0352   BILIRUBINUR Negative 08/12/2018 1402   KETONESUR NEGATIVE 11/15/2019 0352   PROTEINUR NEGATIVE 11/15/2019 0352   UROBILINOGEN 0.2 08/12/2018 1402   NITRITE NEGATIVE 11/15/2019 0352   LEUKOCYTESUR NEGATIVE 11/15/2019 0352   Sepsis Labs Invalid input(s): PROCALCITONIN,  WBC,  LACTICIDVEN Microbiology Recent Results (from the past 240 hour(s))  SARS Coronavirus 2 by RT PCR (hospital order, performed in [facility] hospital lab) Nasopharyngeal Nasopharyngeal Swab     Status: None   Collection Time: 11/15/19 12:45 AM   Specimen: Nasopharyngeal Swab  Result Value Ref Range Status   SARS Coronavirus 2 NEGATIVE NEGATIVE Final    Comment: (NOTE) SARS-CoV-2 target nucleic acids are NOT DETECTED.  The SARS-CoV-2 RNA is generally detectable in upper and lower respiratory specimens during the acute phase of infection. The lowest concentration of SARS-CoV-2 viral copies this assay can  detect is 250 copies / mL. A negative result does not preclude SARS-CoV-2 infection and should not be used as the sole basis for treatment or other patient management decisions.  A negative result may occur with improper specimen collection / handling, submission of specimen other than nasopharyngeal swab, presence of viral mutation(s) within the areas targeted by this assay, and inadequate number of viral copies (<250 copies / mL). A negative result must be combined with clinical observations, patient history, and epidemiological information.  Fact Sheet for Patients:   StrictlyIdeas.no  Fact Sheet for Healthcare Providers: BankingDealers.co.za  This test is not yet approved or  cleared by the [place] FDA and has been authorized for detection and/or diagnosis of SARS-CoV-2 by FDA under an Emergency Use Authorization (EUA).  This EUA will remain in effect (meaning this test can be used) for the duration of the COVID-19 declaration under Section 564(b)(1) of the Act, 21 U.S.C. section 360bbb-3(b)(1), unless the authorization is terminated or revoked sooner.  Performed at [facility], [address] [place], [place] [address]      Time coordinating discharge: 25 minutes   SIGNED:   Louellen Molder, MD  [facility] 11/15/2019, 3:51 PM Pager   If 7PM-7AM, please contact night-coverage [email or web address] Password [email or web address]

## 2019-11-15 NOTE — Evaluation (Signed)
Physical Therapy Evaluation Patient Details Name: Robin Logan MRN: [id number] DOB: 05-13-1936 Today's Date: 11/15/2019   History of Present Illness  Patient is an 84 y.o. female  with medical history significant for hypothyroidism and arthritis who presents to the emergency room with her third episode of right hand numbness and numbness on the right side of the face. Noted to be in SVT with rate of 149 and converted with Cardizem to back to normal sinus rhythm. Work up ongoing   Clinical Impression  PT evaluation and education completed this session. Patient is independent with bed mobility, Modified Independent for transfers due to extra time required. Patient is walking 255ft without assistive device around the nursing station without loss of balance, Modified Independent due to extra time. Noted heart rate up to 100bpm intermittently. LE strength and coordination assessment WNL when tested. Patient is likely at or nearing baseline level of functional mobility and demonstrated good safety awareness with mobility. No further acute PT needs at this time.     Follow Up Recommendations No PT follow up    Equipment Recommendations  None recommended by PT    Recommendations for Other Services       Precautions / Restrictions Precautions Precautions: Fall Restrictions Weight Bearing Restrictions: No      Mobility  Bed Mobility Overal bed mobility: Independent                Transfers Overall transfer level: Modified independent               General transfer comment: extra time required but no physical assistance needed   Ambulation/Gait Ambulation/Gait assistance: Modified independent (Device/Increase time) Gait Distance (Feet): 200 Feet Assistive device: None Gait Pattern/deviations: Step-through pattern;WFL(Within Functional Limits) Gait velocity: decreased   General Gait Details: extra time required   Stairs            Wheelchair Mobility     Modified Rankin (Stroke Patients Only)       Balance   Sitting-balance support: Feet supported;No upper extremity supported Sitting balance-Leahy Scale: Normal     Standing balance support: No upper extremity supported Standing balance-Leahy Scale: Normal                               Pertinent Vitals/Pain Pain Assessment: No/denies pain    Home Living Family/patient expects to be discharged to:: Private residence ([place] ) Living Arrangements: Spouse/significant other Available Help at Discharge: Family;Available PRN/intermittently Type of Home: House Home Access: Level entry     Home Layout: One level Home Equipment: Shower seat Additional Comments: Patient works out at [employer] and does Yoga for exercise     Prior Function Level of Independence: Independent               Hand Dominance   Dominant Hand: Right    Extremity/Trunk Assessment   Upper Extremity Assessment Upper Extremity Assessment: Overall WFL for tasks assessed    Lower Extremity Assessment Lower Extremity Assessment: Overall WFL for tasks assessed RLE Sensation: WNL (light touch and great toe proprioception ) RLE Coordination: WNL (heel to shin without dysmetria ) LLE Sensation: WNL (light touch and great toe proprioception ) LLE Coordination: WNL (heel to shin without dysmetria )       Communication   Communication: No difficulties  Cognition Arousal/Alertness: Awake/alert Behavior During Therapy: WFL for tasks assessed/performed Overall Cognitive Status: Within Functional Limits for tasks assessed  General Comments      Exercises     Assessment/Plan    PT Assessment Patent does not need any further PT services  PT Problem List         PT Treatment Interventions      PT Goals (Current goals can be found in the Care Plan section)  Acute Rehab PT Goals Patient Stated Goal: to go  home  PT Goal Formulation: With patient/family Time For Goal Achievement:  (evaluation and education completed today ) Potential to Achieve Goals: Good    Frequency     Barriers to discharge        Co-evaluation               AM-PAC PT "6 Clicks" Mobility  Outcome Measure Help needed turning from your back to your side while in a flat bed without using bedrails?: None Help needed moving from lying on your back to sitting on the side of a flat bed without using bedrails?: None Help needed moving to and from a bed to a chair (including a wheelchair)?: None Help needed standing up from a chair using your arms (e.g., wheelchair or bedside chair)?: None Help needed to walk in hospital room?: None Help needed climbing 3-5 steps with a railing? : None 6 Click Score: 24    End of Session Equipment Utilized During Treatment: Gait belt Activity Tolerance: Patient tolerated treatment well Patient left: in bed;with family/visitor present;with call bell/phone within reach Nurse Communication: Mobility status PT Visit Diagnosis: Muscle weakness (generalized) (M62.81)    Time: 3419-3790 PT Time Calculation (min) (ACUTE ONLY): 27 min   Charges:   PT Evaluation $PT Eval Low Complexity: 1 Low PT Treatments $Therapeutic Activity: 8-22 mins        Robin Logan, PT, MPT  Robin Logan 11/15/2019, 3:09 PM

## 2019-11-15 NOTE — ED Provider Notes (Signed)
[facility] Emergency Department Provider Note  ____________________________________________  Time seen: Approximately 1:00 AM  I have reviewed the triage vital signs and the nursing notes.   HISTORY  Chief Complaint Numbness   HPI Robin Logan is a 84 y.o. female with a history of hypothyroidism, osteopenia, arthritis, hyperlipidemia who presents for evaluation of right hand and right lip numbness.  Patient reports this is her third episode over the last couple of weeks.  After the first when she came to the emergency department but the wait was too long so she left without being seen.  She followed up with her doctor who ordered an echocardiogram and carotid Dopplers.  She underwent the echo but the Dopplers study is scheduled in a couple of weeks.  This evening she had to episodes where her right hand goes numb and the right lower part of her face around her lip area goes numb.  The episodes last several seconds at a time and resolve without intervention.  She is back to baseline at this time.  No headache, no slurred speech, no facial droop, no unilateral weakness or numbness.  No prior history of stroke.  Patient is a former smoker.  She also noticed some palpitations earlier today but no chest pain.  She denies any palpitations at this time.  No shortness of breath.  No recent illness, no vomiting or diarrhea.   Past Medical History:  Diagnosis Date  . Arthritis   . Depression   . Hyperlipidemia   . Osteopenia   . Osteopenia   . Sleep disorder   . Thyroid disease     Patient Active Problem List   Diagnosis Date Noted  . SVT (supraventricular tachycardia) ([place]) 11/15/2019  . TIA (transient ischemic attack) 11/05/2019  . Cerebrovascular disease 11/05/2019  . Counseling regarding advanced directives and goals of care 12/07/2013  . Osteopenia   . Routine general medical examination at a health care facility 11/19/2011  . Other constipation  10/30/2010  . Hypothyroidism 05/01/2010  . HYPERLIPIDEMIA 05/01/2010  . DJD (degenerative joint disease), multiple sites 05/01/2010  . Sleep disorder 05/01/2010    Past Surgical History:  Procedure Laterality Date  . ABDOMINAL HYSTERECTOMY    . APPENDECTOMY    . CATARACT EXTRACTION W/PHACO Right 03/03/2019   Procedure: CATARACT EXTRACTION PHACO AND INTRAOCULAR LENS PLACEMENT (IOC) RIGHT  00:44.0  16.1%  7.12;  Surgeon: Leandrew Koyanagi, MD;  Location: [facility];  Service: Ophthalmology;  Laterality: Right;  . CATARACT EXTRACTION W/PHACO Left 03/24/2019   Procedure: CATARACT EXTRACTION PHACO AND INTRAOCULAR LENS PLACEMENT (IOC) LEFT  01:09.4  12.5%  8.73;  Surgeon: Leandrew Koyanagi, MD;  Location: [facility];  Service: Ophthalmology;  Laterality: Left;  . TONSILLECTOMY AND ADENOIDECTOMY  1946    Prior to Admission medications   Medication Sig Start Date End Date Taking? Authorizing Provider  Ascorbic Acid (VITAMIN C) 1000 MG tablet Take 1,000 mg by mouth daily.    [provider]  aspirin EC 81 MG tablet Take 81 mg by mouth daily.    [provider]  atorvastatin (LIPITOR) 10 MG tablet Take 1 tablet (10 mg total) by mouth daily. 11/05/19   Venia Carbon, MD  cholecalciferol (VITAMIN D) 1000 UNITS tablet Take 1,000 Units by mouth daily.      [provider]  ibuprofen (ADVIL,MOTRIN) 200 MG tablet Take 600 mg by mouth at bedtime as needed. For restless legs symptoms    [provider]  levothyroxine (SYNTHROID) 88 MCG tablet Take 1 tablet (88 mcg total) by mouth daily. 02/05/19   Venia Carbon, MD  mirtazapine (REMERON) 45 MG tablet TAKE 1 TABLET AT BEDTIME 02/19/19   Venia Carbon, MD  Multiple Vitamins-Minerals (ICAPS AREDS 2 PO) Take by mouth.    [provider]  senna-docusate (SENOKOT-S) 8.6-50 MG per tablet Take 2-4 tablets by mouth 2 (two) times daily.    [provider]    Allergies Patient has  no known allergies.  Family History  Problem Relation Age of Onset  . Hypertension Father   . Stroke Brother   . Diabetes Brother   . Cancer Neg Hx        no breast or colon    Social History Social History   Tobacco Use  . Smoking status: Former Smoker    Quit date: 06/03/1978    Years since quitting: 41.4  . Smokeless tobacco: Never Used  Vaping Use  . Vaping Use: Never used  Substance Use Topics  . Alcohol use: Yes    Alcohol/week: 0.0 standard drinks    Comment: wine  . Drug use: No    Review of Systems  Constitutional: Negative for fever. Eyes: Negative for visual changes. ENT: Negative for sore throat. Neck: No neck pain  Cardiovascular: Negative for chest pain. + palpitations Respiratory: Negative for shortness of breath. Gastrointestinal: Negative for abdominal pain, vomiting or diarrhea. Genitourinary: Negative for dysuria. Musculoskeletal: Negative for back pain. Skin: Negative for rash. Neurological: Negative for headaches. + R hand and R facial numbness Psych: No SI or HI  ____________________________________________   PHYSICAL EXAM:  VITAL SIGNS: ED Triage Vitals [11/14/19 2351]  Enc Vitals Group     BP (!) 160/89     Pulse Rate (!) 149     Resp 20     Temp 98.3 F (36.8 C)     Temp Source Oral     SpO2 95 %     Weight 134 lb (60.8 kg)     Height 5\' 4"  (1.626 m)     Head Circumference      Peak Flow      Pain Score 0     Pain Loc      Pain Edu?      Excl. in [place]?     Constitutional: Alert and oriented. Well appearing and in no apparent distress. HEENT:      Head: Normocephalic and atraumatic.         Eyes: Conjunctivae are normal. Sclera is non-icteric.       Mouth/Throat: Mucous membranes are moist.       Neck: Supple with no signs of meningismus.  No bruits Cardiovascular: Tachycardic with regular rhythm. Respiratory: Normal respiratory effort. Lungs are clear to auscultation bilaterally. No wheezes, crackles, or rhonchi.    Gastrointestinal: Soft, non tender. Musculoskeletal:  No edema, cyanosis, or erythema of extremities. Neurologic: Normal speech and language. Face is symmetric. EOMI, PERRL, intact strength sensation x4, no pronator drift, no dysmetria Skin: Skin is warm, dry and intact. No rash noted. Psychiatric: Mood and affect are normal. Speech and behavior are normal.  ____________________________________________   LABS (all labs ordered are listed, but only abnormal results are displayed)  Labs Reviewed  CBC WITH DIFFERENTIAL/PLATELET - Abnormal; Notable for the following components:      Result Value   RBC 3.80 (*)    Hemoglobin 11.8 (*)    HCT 35.4 (*)    All other components within  normal limits  COMPREHENSIVE METABOLIC PANEL - Abnormal; Notable for the following components:   Sodium 134 (*)    Glucose, Bld 105 (*)    BUN 26 (*)    GFR calc non Af Amer 54 (*)    All other components within normal limits  SARS CORONAVIRUS 2 BY RT PCR (HOSPITAL ORDER, [facility])  PROTIME-INR  MAGNESIUM  HEMOGLOBIN A1C  LIPID PANEL  URINE DRUG SCREEN, QUALITATIVE ([facility] ONLY)  URINALYSIS, ROUTINE W REFLEX MICROSCOPIC  TROPONIN I (HIGH SENSITIVITY)  TROPONIN I (HIGH SENSITIVITY)   ____________________________________________  EKG  ED ECG REPORT I, Rudene Re, the attending physician, personally viewed and interpreted this ECG.  SVT with a rate of 144, normal QTC, left axis deviation, no ST elevations with mild ST depressions.  New when compared to prior.  01:39AM -normal sinus rhythm, rate of 72, normal intervals, normal axis, no ST elevations or depressions.  Normal EKG. ____________________________________________  RADIOLOGY  I have personally reviewed the images performed during this visit and I agree with the Radiologist's read.   Interpretation by Radiologist:  CT Head Wo Contrast  Result Date: 11/15/2019 CLINICAL DATA:  TIA EXAM: CT HEAD WITHOUT  CONTRAST TECHNIQUE: Contiguous axial images were obtained from the base of the skull through the vertex without intravenous contrast. COMPARISON:  11/03/2019 FINDINGS: Brain: Mild age related volume loss. No acute intracranial abnormality. Specifically, no hemorrhage, hydrocephalus, mass lesion, acute infarction, or significant intracranial injury. Vascular: No hyperdense vessel or unexpected calcification. Skull: No acute calvarial abnormality. Sinuses/Orbits: Visualized paranasal sinuses and mastoids clear. Orbital soft tissues unremarkable. Other: None IMPRESSION: No acute intracranial abnormality. Electronically Signed   By: Rolm Baptise M.D.   On: 11/15/2019 01:23   MR BRAIN WO CONTRAST  Result Date: 11/15/2019 CLINICAL DATA:  Right facial numbness EXAM: MRI HEAD WITHOUT CONTRAST TECHNIQUE: Multiplanar, multiecho pulse sequences of the brain and surrounding structures were obtained without intravenous contrast. COMPARISON:  None. FINDINGS: BRAIN: No acute infarct, acute hemorrhage or extra-axial collection. Multifocal white matter hyperintensity, most commonly due to chronic ischemic microangiopathy. Normal volume of CSF spaces. Multifocal chronic microhemorrhage with hemosiderin deposition over the left frontal lobe. Normal midline structures. VASCULAR: Major flow voids are preserved. SKULL AND UPPER CERVICAL SPINE: Normal calvarium and skull base. Visualized upper cervical spine and soft tissues are normal. SINUSES/ORBITS: No paranasal sinus fluid levels or advanced mucosal thickening. No mastoid or middle ear effusion. Normal orbits. IMPRESSION: 1. No acute intracranial abnormality. 2. Mild chronic small vessel disease. Electronically Signed   By: Ulyses Jarred M.D.   On: 11/15/2019 03:25      ____________________________________________   PROCEDURES  Procedure(s) performed:yes .1-3 Lead EKG Interpretation Performed by: Rudene Re, MD Authorized by: Rudene Re, MD      Interpretation: abnormal     ECG rate assessment: tachycardic     Rhythm: SVT     Ectopy: none   .Cardioversion  Date/Time: 11/15/2019 1:07 AM Performed by: Rudene Re, MD Authorized by: Rudene Re, MD   Consent:    Consent obtained:  Verbal   Consent given by:  Patient   Risks discussed:  Induced arrhythmia   Alternatives discussed:  Rate-control medication and alternative treatment Pre-procedure details:    Cardioversion basis:  Elective   Rhythm:  Supraventricular tachycardia   Electrode placement:  Anterior-posterior Patient sedated: No Post-procedure details:    Patient status:  Alert   Patient tolerance of procedure:  Tolerated well, no immediate complications Comments:  Medical cardioversion with adenosine 6mg  follow by 12 mg. Brief conversion to NSR with return to SVT.     Critical Care performed: yes  CRITICAL CARE Performed by: Rudene Re  ?  Total critical care time: 45 min  Critical care time was exclusive of separately billable procedures and treating other patients.  Critical care was necessary to treat or prevent imminent or life-threatening deterioration.  Critical care was time spent personally by me on the following activities: development of treatment plan with patient and/or surrogate as well as nursing, discussions with consultants, evaluation of patient's response to treatment, examination of patient, obtaining history from patient or surrogate, ordering and performing treatments and interventions, ordering and review of laboratory studies, ordering and review of radiographic studies, pulse oximetry and re-evaluation of patient's condition.  ____________________________________________   INITIAL IMPRESSION / ASSESSMENT AND PLAN / ED COURSE   84 y.o. female with a history of hypothyroidism, osteopenia, arthritis, hyperlipidemia who presents for evaluation of right hand and right lip numbness.  Symptoms have resolved at  this time.  She is well-appearing and in no distress with normal blood pressure and normal neurological exam.  Presentation is concerning for TIA.  Patient has had at least 3-5 episodes since the beginning of the month.  She was found to be in SVT.  2 attempts to medical cardiovert with adenosine were unsuccessful.  Patient was then started on Cardizem for rate control and HR is in the low 100s after 15mg  bolus and drip at 10mg .  We will start stroke work-up and admit patient to the hospitalist service.  History gathered from patient and her husband was at bedside.  Plan also discussed with patient and her husband were both in agreement.  _________________________ 1:39 AM on 11/15/2019 ----------------------------------------- Patient converted to normal sinus rhythm.  Cardizem drip discontinued.  Head CT negative for acute findings, confirmed by radiology.  MRI also negative, confirmed by radiology.  Patient received full aspirin and was admitted to the hospitalist for further evaluation.     _____________________________________________ Please note:  Patient was evaluated in Emergency Department today for the symptoms described in the history of present illness. Patient was evaluated in the context of the global COVID-19 pandemic, which necessitated consideration that the patient might be at risk for infection with the SARS-CoV-2 virus that causes COVID-19. Institutional protocols and algorithms that pertain to the evaluation of patients at risk for COVID-19 are in a state of rapid change based on information released by regulatory bodies including the CDC and federal and state organizations. These policies and algorithms were followed during the patient's care in the ED.  Some ED evaluations and interventions may be delayed as a result of limited staffing during the pandemic.   [place] Controlled Substance Database was reviewed by me. ____________________________________________   FINAL CLINICAL  IMPRESSION(S) / ED DIAGNOSES   Final diagnoses:  TIA (transient ischemic attack)  SVT (supraventricular tachycardia) (HCC)      NEW MEDICATIONS STARTED DURING THIS VISIT:  ED Discharge Orders    None       Note:  This document was prepared using Dragon voice recognition software and may include unintentional dictation errors.    Alfred Levins, [place], MD 11/15/19 [phone]

## 2019-11-15 NOTE — ED Notes (Signed)
Purewick placed by this tech; urine noticed in canister.

## 2019-11-15 NOTE — ED Notes (Addendum)
  Pt transported to ct 

## 2019-11-15 NOTE — Consult Note (Addendum)
Reason for Consult: R sided numbness  Requesting Physician: Dr. Clementeen Graham   CC: R sided weakness   HPI: Robin Logan is an 84 y.o. female  with medical history significant for hypothyroidism and arthritis who presents to the emergency room with her third episode of right hand numbness and numbness on the right side of the face.  She was evaluated in the emergency room several days ago but left without being seen and is currently undergoing an outpatient work-up.   She had 2 additional episodes yesterday.  Each of her 3 episodes last just a few seconds. On arrival patient was noted to be in SVT with a rate of 149.  She did not convert with adenosine but subsequently converted with Cardizem on his back to normal sinus rhythm.  Her work-up was otherwise unremarkable with negative CT head.  Pt is back to baseline.    Past Medical History:  Diagnosis Date   Arthritis    Depression    Hyperlipidemia    Osteopenia    Osteopenia    Sleep disorder    Thyroid disease     Past Surgical History:  Procedure Laterality Date   ABDOMINAL HYSTERECTOMY     APPENDECTOMY     CATARACT EXTRACTION W/PHACO Right 03/03/2019   Procedure: CATARACT EXTRACTION PHACO AND INTRAOCULAR LENS PLACEMENT (IOC) RIGHT  00:44.0  16.1%  7.12;  Surgeon: Leandrew Koyanagi, MD;  Location: [place];  Service: Ophthalmology;  Laterality: Right;   CATARACT EXTRACTION W/PHACO Left 03/24/2019   Procedure: CATARACT EXTRACTION PHACO AND INTRAOCULAR LENS PLACEMENT (IOC) LEFT  01:09.4  12.5%  8.73;  Surgeon: Leandrew Koyanagi, MD;  Location: [place];  Service: Ophthalmology;  Laterality: Left;   TONSILLECTOMY AND ADENOIDECTOMY  1946    Family History  Problem Relation Age of Onset   Hypertension Father    Stroke Brother    Diabetes Brother    Cancer Neg Hx        no breast or colon    Social History:  reports that she quit smoking about 41 years ago. She has never used smokeless  tobacco. She reports current alcohol use. She reports that she does not use drugs.  No Known Allergies  Medications: I have reviewed the patient's current medications.  ROS: History obtained from the patient  General ROS: negative for - chills, fatigue, fever, night sweats, weight gain or weight loss Psychological ROS: negative for - behavioral disorder, hallucinations, memory difficulties, mood swings or suicidal ideation Ophthalmic ROS: negative for - blurry vision, double vision, eye pain or loss of vision ENT ROS: negative for - epistaxis, nasal discharge, oral lesions, sore throat, tinnitus or vertigo Allergy and Immunology ROS: negative for - hives or itchy/watery eyes Hematological and Lymphatic ROS: negative for - bleeding problems, bruising or swollen lymph nodes Endocrine ROS: negative for - galactorrhea, hair pattern changes, polydipsia/polyuria or temperature intolerance Respiratory ROS: negative for - cough, hemoptysis, shortness of breath or wheezing Cardiovascular ROS: negative for - chest pain, dyspnea on exertion, edema or irregular heartbeat Gastrointestinal ROS: negative for - abdominal pain, diarrhea, hematemesis, nausea/vomiting or stool incontinence Genito-Urinary ROS: negative for - dysuria, hematuria, incontinence or urinary frequency/urgency Musculoskeletal ROS: negative for - joint swelling or muscular weakness Neurological ROS: as noted in HPI Dermatological ROS: negative for rash and skin lesion changes  Physical Examination: Blood pressure 116/76, pulse 76, temperature 98.3 F (36.8 C), temperature source Oral, resp. rate 16, height 5\' 4"  (1.626 m), weight 60.8 kg, SpO2 100 %.  Neurological Examination   Mental Status: Alert, oriented, thought content appropriate.  Speech fluent without evidence of aphasia.  Able to follow 3 step commands without difficulty. Cranial Nerves: II: Discs flat bilaterally; Visual fields grossly normal, pupils equal, round,  reactive to light and accommodation III,IV, VI: ptosis not present, extra-ocular motions intact bilaterally V,VII: smile symmetric, facial light touch sensation normal bilaterally VIII: hearing normal bilaterally IX,X: gag reflex present XI: bilateral shoulder shrug XII: midline tongue extension Motor: Right : Upper extremity   5/5    Left:     Upper extremity   5/5  Lower extremity   5/5     Lower extremity   5/5 Tone and bulk:normal tone throughout; no atrophy noted Sensory: Pinprick and light touch intact throughout, bilaterally Deep Tendon Reflexes: 2+ and symmetric throughout Plantars: Right: downgoing   Left: downgoing Cerebellar: normal finger-to-nose, normal rapid alternating movements and normal heel-to-shin test Gait: not tested       Laboratory Studies:   Basic Metabolic Panel: Recent Labs  Lab 11/14/19 2352  NA 134*  K 4.1  CL 99  CO2 25  GLUCOSE 105*  BUN 26*  CREATININE 0.98  CALCIUM 9.3  MG 2.0    Liver Function Tests: Recent Labs  Lab 11/14/19 2352  AST 33  ALT 27  ALKPHOS 64  BILITOT 0.7  PROT 7.1  ALBUMIN 4.3   No results for input(s): LIPASE, AMYLASE in the last 168 hours. No results for input(s): AMMONIA in the last 168 hours.  CBC: Recent Labs  Lab 11/14/19 2352  WBC 6.2  NEUTROABS 3.7  HGB 11.8*  HCT 35.4*  MCV 93.2  PLT 252    Cardiac Enzymes: No results for input(s): CKTOTAL, CKMB, CKMBINDEX, TROPONINI in the last 168 hours.  BNP: Invalid input(s): POCBNP  CBG: No results for input(s): GLUCAP in the last 168 hours.  Microbiology: Results for orders placed or performed during the hospital encounter of 11/14/19  SARS Coronavirus 2 by RT PCR (hospital order, performed in [facility] hospital lab) Nasopharyngeal Nasopharyngeal Swab     Status: None   Collection Time: 11/15/19 12:45 AM   Specimen: Nasopharyngeal Swab  Result Value Ref Range Status   SARS Coronavirus 2 NEGATIVE NEGATIVE Final    Comment:  (NOTE) SARS-CoV-2 target nucleic acids are NOT DETECTED.  The SARS-CoV-2 RNA is generally detectable in upper and lower respiratory specimens during the acute phase of infection. The lowest concentration of SARS-CoV-2 viral copies this assay can detect is 250 copies / mL. A negative result does not preclude SARS-CoV-2 infection and should not be used as the sole basis for treatment or other patient management decisions.  A negative result may occur with improper specimen collection / handling, submission of specimen other than nasopharyngeal swab, presence of viral mutation(s) within the areas targeted by this assay, and inadequate number of viral copies (<250 copies / mL). A negative result must be combined with clinical observations, patient history, and epidemiological information.  Fact Sheet for Patients:   StrictlyIdeas.no  Fact Sheet for Healthcare Providers: BankingDealers.co.za  This test is not yet approved or  cleared by the [place] FDA and has been authorized for detection and/or diagnosis of SARS-CoV-2 by FDA under an Emergency Use Authorization (EUA).  This EUA will remain in effect (meaning this test can be used) for the duration of the COVID-19 declaration under Section 564(b)(1) of the Act, 21 U.S.C. section 360bbb-3(b)(1), unless the authorization is terminated or revoked sooner.  Performed at [facility], [address] [place], [place] [address]     Coagulation Studies: Recent Labs    11/14/19 2352  LABPROT 11.9  INR 0.9    Urinalysis:  Recent Labs  Lab 11/15/19 0352  COLORURINE COLORLESS*  LABSPEC 1.005  PHURINE 6.0  GLUCOSEU NEGATIVE  HGBUR NEGATIVE  BILIRUBINUR NEGATIVE  KETONESUR NEGATIVE  PROTEINUR NEGATIVE  NITRITE NEGATIVE  LEUKOCYTESUR NEGATIVE    Lipid Panel:     Component Value Date/Time   CHOL 185 11/15/2019 0329   TRIG 39 11/15/2019 0329   HDL 86 11/15/2019  0329   CHOLHDL 2.2 11/15/2019 0329   VLDL 8 11/15/2019 0329   LDLCALC 91 11/15/2019 0329    HgbA1C: No results found for: HGBA1C  Urine Drug Screen:      Component Value Date/Time   LABOPIA NONE DETECTED 11/15/2019 0352   COCAINSCRNUR NONE DETECTED 11/15/2019 0352   LABBENZ NONE DETECTED 11/15/2019 0352   AMPHETMU NONE DETECTED 11/15/2019 0352   THCU NONE DETECTED 11/15/2019 0352   LABBARB NONE DETECTED 11/15/2019 0352    Alcohol Level: No results for input(s): ETH in the last 168 hours.  Other results: EKG: periods of SVT no sinus .  Imaging: CT Head Wo Contrast  Result Date: 11/15/2019 CLINICAL DATA:  TIA EXAM: CT HEAD WITHOUT CONTRAST TECHNIQUE: Contiguous axial images were obtained from the base of the skull through the vertex without intravenous contrast. COMPARISON:  11/03/2019 FINDINGS: Brain: Mild age related volume loss. No acute intracranial abnormality. Specifically, no hemorrhage, hydrocephalus, mass lesion, acute infarction, or significant intracranial injury. Vascular: No hyperdense vessel or unexpected calcification. Skull: No acute calvarial abnormality. Sinuses/Orbits: Visualized paranasal sinuses and mastoids clear. Orbital soft tissues unremarkable. Other: None IMPRESSION: No acute intracranial abnormality. Electronically Signed   By: Rolm Baptise M.D.   On: 11/15/2019 01:23   MR BRAIN WO CONTRAST  Result Date: 11/15/2019 CLINICAL DATA:  Right facial numbness EXAM: MRI HEAD WITHOUT CONTRAST TECHNIQUE: Multiplanar, multiecho pulse sequences of the brain and surrounding structures were obtained without intravenous contrast. COMPARISON:  None. FINDINGS: BRAIN: No acute infarct, acute hemorrhage or extra-axial collection. Multifocal white matter hyperintensity, most commonly due to chronic ischemic microangiopathy. Normal volume of CSF spaces. Multifocal chronic microhemorrhage with hemosiderin deposition over the left frontal lobe. Normal midline structures. VASCULAR:  Major flow voids are preserved. SKULL AND UPPER CERVICAL SPINE: Normal calvarium and skull base. Visualized upper cervical spine and soft tissues are normal. SINUSES/ORBITS: No paranasal sinus fluid levels or advanced mucosal thickening. No mastoid or middle ear effusion. Normal orbits. IMPRESSION: 1. No acute intracranial abnormality. 2. Mild chronic small vessel disease. Electronically Signed   By: Ulyses Jarred M.D.   On: 11/15/2019 03:25   US Carotid Bilateral (at [facility] and [facility] only)  Result Date: 11/15/2019 CLINICAL DATA:  TIA.  History of hyperlipidemia.  Former smoker. EXAM: BILATERAL CAROTID DUPLEX ULTRASOUND TECHNIQUE: Pearline Cables scale imaging, color Doppler and duplex ultrasound were performed of bilateral carotid and vertebral arteries in the neck. COMPARISON:  None. FINDINGS: Criteria: Quantification of carotid stenosis is based on velocity parameters that correlate the residual internal carotid diameter with NASCET-based stenosis levels, using the diameter of the distal internal carotid lumen as the denominator for stenosis measurement. The following velocity measurements were obtained: RIGHT ICA: 59/18 cm/sec CCA: 81/82 cm/sec SYSTOLIC ICA/CCA RATIO:  1.1 ECA: 67 cm/sec LEFT ICA: 66/23 cm/sec CCA: 99/37 cm/sec SYSTOLIC ICA/CCA RATIO:  1.2 ECA: 61 cm/sec RIGHT CAROTID ARTERY: There is a moderate to large amount of  eccentric echogenic plaque within the right carotid bulb (image 16), extending to involve the origin and proximal aspects of the right internal carotid artery (image 24), not resulting in elevated peak systolic velocities within the interrogated course the right internal carotid artery to suggest a hemodynamically significant stenosis. RIGHT VERTEBRAL ARTERY:  Antegrade flow LEFT CAROTID ARTERY: Moderate amount of eccentric echogenic plaque within the left carotid bulb (image 49). There is a minimal amount of eccentric echogenic plaque involving the origin and proximal aspects of the left internal  carotid artery (image 57), not resulting in elevated peak systolic velocities within the interrogated course the left internal carotid artery to suggest a hemodynamically significant stenosis. LEFT VERTEBRAL ARTERY:  Antegrade flow Note is made of a benign appearing non pathologically enlarged left cervical lymph node. IMPRESSION: Moderate to large amount of bilateral atherosclerotic plaque, right greater than left, not resulting in a hemodynamically significant stenosis within either internal carotid artery. Electronically Signed   By: Sandi Mariscal M.D.   On: 11/15/2019 08:34     Assessment/Plan:  84 y.o. female  with medical history significant for hypothyroidism and arthritis who presents to the emergency room with her third episode of right hand numbness and numbness on the right side of the face.  She was evaluated in the emergency room several days ago but left without being seen and is currently undergoing an outpatient work-up.   She had 2 additional episodes yesterday.  Each of her 3 episodes last just a few seconds. On arrival patient was noted to be in SVT with a rate of 149.  She did not convert with adenosine but subsequently converted with Cardizem on his back to normal sinus rhythm.  Her work-up was otherwise unremarkable with negative CT head.  Pt is back to baseline.    - MRI no acute abnormalities  - ? SVT related and symptoms were transient - rare to have same episodes with same vessel involved regarding TIA - Was on ASA 81mg  at home can continue - CTA h/n ordered to make sure no intracranial stenosis in L MCA arrear to cause these symptoms.   Addendum: CTA reviewed no clear intracranial abnormality No further imaging from Neurological stand point D/c planning when safe from  Primary and cardiac standpoint  11/15/2019, 10:47 AM

## 2019-11-15 NOTE — Progress Notes (Signed)
OT Cancellation Note  Patient Details Name: Robin Logan MRN: [id number] DOB: 1936/01/21   Cancelled Treatment:    Reason Eval/Treat Not Completed: OT screened, no needs identified, will sign off. Consult received, chart reviewed. Upon attempt, pt back to baseline. No functional deficits noted. Will sign off. Please re-consult if additional needs arise.   Jeni Salles, MPH, MS, OTR/L ascom [phone] 11/15/19, 4:00 PM

## 2019-11-15 NOTE — Progress Notes (Signed)
IV removed before discharge. Went over discharge instructions with patient. All questions were answered and she stated that she understood. Patient going home POV with husband.

## 2019-11-16 ENCOUNTER — Telehealth: Payer: Self-pay

## 2019-11-16 NOTE — Telephone Encounter (Signed)
Transition Care Management Follow-up Telephone Call  Date of discharge and from where: 11/15/2019, [facility]  How have you been since you were released from the hospital? Patient states that she is feeling better.   Any questions or concerns? No   Items Reviewed:  Did the pt receive and understand the discharge instructions provided? Yes   Medications obtained and verified? Yes   Any new allergies since your discharge? No   Dietary orders reviewed? Yes  Do you have support at home? Yes   Functional Questionnaire: (I = Independent and D = Dependent) ADLs: I  Bathing/Dressing- I  Meal Prep- I  Eating- I  Maintaining continence- I  Transferring/Ambulation- I  Managing Meds- I  Follow up appointments reviewed:   PCP Hospital f/u appt confirmed? Yes  Scheduled to see Dr. Silvio Pate on 11/24/2019 @ 12 pm. Patient states that she needs her carotid appt scheduled for 11/22/2019 cancelled because this was completed in the hospital.   [facility] f/u appt confirmed? Yes  Are transportation arrangements needed? No   If their condition worsens, is the pt aware to call PCP or go to the Emergency Dept.? Yes  Was the patient provided with contact information for the PCP's office or ED? Yes  Was to pt encouraged to call back with questions or concerns? Yes

## 2019-11-24 ENCOUNTER — Other Ambulatory Visit: Payer: Self-pay

## 2019-11-24 ENCOUNTER — Encounter: Payer: Self-pay | Admitting: Internal Medicine

## 2019-11-24 ENCOUNTER — Inpatient Hospital Stay: Payer: Medicare Other | Admitting: Internal Medicine

## 2019-11-24 ENCOUNTER — Ambulatory Visit (INDEPENDENT_AMBULATORY_CARE_PROVIDER_SITE_OTHER): Payer: Medicare Other | Admitting: Internal Medicine

## 2019-11-24 DIAGNOSIS — I700 Atherosclerosis of aorta: Secondary | ICD-10-CM

## 2019-11-24 DIAGNOSIS — E039 Hypothyroidism, unspecified: Secondary | ICD-10-CM | POA: Diagnosis not present

## 2019-11-24 DIAGNOSIS — Z8673 Personal history of transient ischemic attack (TIA), and cerebral infarction without residual deficits: Secondary | ICD-10-CM

## 2019-11-24 DIAGNOSIS — I779 Disorder of arteries and arterioles, unspecified: Secondary | ICD-10-CM | POA: Insufficient documentation

## 2019-11-24 DIAGNOSIS — I6523 Occlusion and stenosis of bilateral carotid arteries: Secondary | ICD-10-CM | POA: Diagnosis not present

## 2019-11-24 DIAGNOSIS — I471 Supraventricular tachycardia: Secondary | ICD-10-CM

## 2019-11-24 NOTE — Assessment & Plan Note (Signed)
Seen on imaging Is on statin anyway

## 2019-11-24 NOTE — Assessment & Plan Note (Signed)
Ongoing right sided sensory abnormalities Nothing on testing to explain this Will set up with neurology for follow up if this continues On ASA, statin

## 2019-11-24 NOTE — Patient Instructions (Signed)
Please go back to the levothyroxine 88 mcg daily. If you continue to have the numb spells, let me know and I will set you up with the neurologist.

## 2019-11-24 NOTE — Assessment & Plan Note (Signed)
Had asymptomatic SVT Converted with diltiazem (not adenosine) No Rx now Has cardiology follow up

## 2019-11-24 NOTE — Progress Notes (Signed)
Subjective:    Patient ID: Robin Logan, female    DOB: 1935-07-22, 84 y.o.   MRN: [id number]  HPI  Here for hospital follow up This visit occurred during the SARS-CoV-2 public health emergency.  Safety protocols were in place, including screening questions prior to the visit, additional usage of staff PPE, and extensive cleaning of exam room while observing appropriate contact time as indicated for disinfecting solutions.   Reviewed my notes and then hospital records Had recurrence of her right side numbness Went to ER Found to be in SVT--adenosine didn't work but converted with diltiazem Had carotid ultrasound that showed significant plaque but no hemodynamic issues Wound up with CT angio---showed aortic atherosclerosis and carotid calcifications MRI of brain didn't show any significant lesions (mild small vessel disease)  Had neurology evaluation--didn't recommend any other testing or Rx She has had ongoing numbness though on right side Did have numbness in right hand again yesterday--nurse from [facility] came and saw her. Neuro exam normal. BP 132/72  Never had palpitations throughout this Some anxiety with ambulance ride though No chest pain  No SOB Echo had been benign Sent home without Rx  Had mildly elevated TSH Levothyroxine dose increased by hospitalist  Current Outpatient Medications on File Prior to Visit  Medication Sig Dispense Refill   Ascorbic Acid (VITAMIN C) 1000 MG tablet Take 1,000 mg by mouth daily.     aspirin EC 81 MG tablet Take 81 mg by mouth daily.     atorvastatin (LIPITOR) 10 MG tablet Take 1 tablet (10 mg total) by mouth daily. 90 tablet 3   cholecalciferol (VITAMIN D) 1000 UNITS tablet Take 1,000 Units by mouth daily.       ibuprofen (ADVIL,MOTRIN) 200 MG tablet Take 600 mg by mouth at bedtime as needed. For restless legs symptoms     levothyroxine (SYNTHROID) 100 MCG tablet Take 1 tablet (100 mcg total) by mouth daily at 6 (six) AM. 30  tablet 0   mirtazapine (REMERON) 45 MG tablet TAKE 1 TABLET AT BEDTIME 90 tablet 3   Multiple Vitamins-Minerals (ICAPS AREDS 2 PO) Take by mouth.     senna-docusate (SENOKOT-S) 8.6-50 MG per tablet Take 2-4 tablets by mouth 2 (two) times daily.     No current facility-administered medications on file prior to visit.    No Known Allergies  Past Medical History:  Diagnosis Date   Arthritis    Depression    Hyperlipidemia    Osteopenia    Osteopenia    Sleep disorder    Thyroid disease     Past Surgical History:  Procedure Laterality Date   ABDOMINAL HYSTERECTOMY     APPENDECTOMY     CATARACT EXTRACTION W/PHACO Right 03/03/2019   Procedure: CATARACT EXTRACTION PHACO AND INTRAOCULAR LENS PLACEMENT (IOC) RIGHT  00:44.0  16.1%  7.12;  Surgeon: Leandrew Koyanagi, MD;  Location: [place];  Service: Ophthalmology;  Laterality: Right;   CATARACT EXTRACTION W/PHACO Left 03/24/2019   Procedure: CATARACT EXTRACTION PHACO AND INTRAOCULAR LENS PLACEMENT (IOC) LEFT  01:09.4  12.5%  8.73;  Surgeon: Leandrew Koyanagi, MD;  Location: [place];  Service: Ophthalmology;  Laterality: Left;   TONSILLECTOMY AND ADENOIDECTOMY  1946    Family History  Problem Relation Age of Onset   Hypertension Father    Stroke Brother    Diabetes Brother    Cancer Neg Hx        no breast or colon    Social History  Socioeconomic History   Marital status: Married    Spouse name: Not on file   Number of children: Not on file   Years of education: Not on file   Highest education level: Not on file  Occupational History   Occupation: retired- Production manager of deeds for [employer]  Tobacco Use   Smoking status: Former Smoker    Quit date: 06/03/1978    Years since quitting: 41.5   Smokeless tobacco: Never Used  Vaping Use   Vaping Use: Never used  Substance and Sexual Activity   Alcohol use: Yes    Alcohol/week: 0.0 standard drinks    Comment:  wine   Drug use: No   Sexual activity: Not on file  Other Topics Concern   Not on file  Social History Narrative   Requests DNR --order done 12/24/10. Currently keeping it in draw, would accept resuscitation for now   Has living will   Husband is health care POA---alternate would be nieces (both sides)   No tube feedings if cognitively unaware   Social Determinants of Health   Financial Resource Strain:    Difficulty of Paying Living Expenses:   Food Insecurity:    Worried About Charity fundraiser in the Last Year:    Arboriculturist in the Last Year:   Transportation Needs:    Film/video editor (Medical):    Lack of Transportation (Non-Medical):   Physical Activity:    Days of Exercise per Week:    Minutes of Exercise per Session:   Stress:    Feeling of Stress :   Social Connections:    Frequency of Communication with Friends and Family:    Frequency of Social Gatherings with Friends and Family:    Attends Religious Services:    Active Member of Clubs or Organizations:    Attends Music therapist:    Marital Status:   Intimate Partner Violence:    Fear of Current or Ex-Partner:    Emotionally Abused:    Physically Abused:    Sexually Abused:    Review of Systems  Generally sleeping okay Appetite is fine    Objective:   Physical Exam  Constitutional: No distress.  Cardiovascular: Normal rate, regular rhythm and normal pulses. Exam reveals no gallop.  No murmur heard. Respiratory: Effort normal and breath sounds normal. She has no wheezes. She has no rales.  GI: Normal appearance.  Neurological: She is alert.  Psychiatric: Her behavior is normal. Mood normal.           Assessment & Plan:

## 2019-11-24 NOTE — Assessment & Plan Note (Signed)
Markedly abnormal ultrasound but reassuring CTA Continue the ASA and statin

## 2019-11-24 NOTE — Assessment & Plan Note (Signed)
Don't want to overtreat Will have her go back to the 88 mcg

## 2019-12-03 ENCOUNTER — Encounter: Payer: Self-pay | Admitting: Cardiology

## 2019-12-03 ENCOUNTER — Other Ambulatory Visit: Payer: Self-pay

## 2019-12-03 ENCOUNTER — Ambulatory Visit (INDEPENDENT_AMBULATORY_CARE_PROVIDER_SITE_OTHER): Payer: Medicare Other | Admitting: Cardiology

## 2019-12-03 VITALS — BP 122/78 | HR 68 | Ht 64.0 in | Wt 128.5 lb

## 2019-12-03 DIAGNOSIS — G459 Transient cerebral ischemic attack, unspecified: Secondary | ICD-10-CM

## 2019-12-03 DIAGNOSIS — I471 Supraventricular tachycardia: Secondary | ICD-10-CM | POA: Diagnosis not present

## 2019-12-03 MED ORDER — METOPROLOL SUCCINATE ER 50 MG PO TB24
25.0000 mg | ORAL_TABLET | Freq: Every day | ORAL | 6 refills | Status: DC
Start: 2019-12-03 — End: 2021-01-04

## 2019-12-03 NOTE — Progress Notes (Signed)
Cardiology Office Note:    Date:  12/03/2019   ID:  Robin Logan, DOB 27-Aug-1935, MRN [id number]  PCP:  Venia Carbon, MD  [facility] Cardiologist:  Kate Sable, MD  [place] Electrophysiologist:  None   Referring MD: Venia Carbon, MD   Chief Complaint  Patient presents with  . office visit    Pt has no complaints. Meds verbally reviewed w/ pt.    History of Present Illness:    Robin Logan is a 84 y.o. female with a hx of hyperlipidemia, SVT recent TIA who presents due to TIA.  Patient was seen in the emergency room 2 weeks ago on 11/15/2019 due to right arm numbness and also numbness in the right side of her face.  Denies any weakness, blurred vision, headaches, dizziness or syncope.  Head CT was unrevealing.  While in the ED, she was noted to be in SVT, heart rate 149.  SVT did not respond to adenosine but subsequently responded to Cardizem.  Work-up with echocardiogram showed normal systolic function EF 60 to 65%, mild MR.  Carotid ultrasound showed moderate bilateral atherosclerotic plaque right greater than left with no hemodynamically significant stenosis.  Patient was placed on aspirin, statin.   Past Medical History:  Diagnosis Date  . Arthritis   . Depression   . Hyperlipidemia   . Osteopenia   . Osteopenia   . Sleep disorder   . Thyroid disease     Past Surgical History:  Procedure Laterality Date  . ABDOMINAL HYSTERECTOMY    . APPENDECTOMY    . CATARACT EXTRACTION W/PHACO Right 03/03/2019   Procedure: CATARACT EXTRACTION PHACO AND INTRAOCULAR LENS PLACEMENT (IOC) RIGHT  00:44.0  16.1%  7.12;  Surgeon: Leandrew Koyanagi, MD;  Location: [place];  Service: Ophthalmology;  Laterality: Right;  . CATARACT EXTRACTION W/PHACO Left 03/24/2019   Procedure: CATARACT EXTRACTION PHACO AND INTRAOCULAR LENS PLACEMENT (IOC) LEFT  01:09.4  12.5%  8.73;  Surgeon: Leandrew Koyanagi, MD;  Location: [place];  Service:  Ophthalmology;  Laterality: Left;  . TONSILLECTOMY AND ADENOIDECTOMY  1946    Current Medications: Current Meds  Medication Sig  . Ascorbic Acid (VITAMIN C) 1000 MG tablet Take 1,000 mg by mouth daily.  Marland Kitchen aspirin EC 81 MG tablet Take 81 mg by mouth daily.  Marland Kitchen atorvastatin (LIPITOR) 10 MG tablet Take 1 tablet (10 mg total) by mouth daily.  . cholecalciferol (VITAMIN D) 1000 UNITS tablet Take 1,000 Units by mouth daily.    Marland Kitchen ibuprofen (ADVIL,MOTRIN) 200 MG tablet Take 600 mg by mouth at bedtime as needed. For restless legs symptoms  . levothyroxine (SYNTHROID) 88 MCG tablet Take 88 mcg by mouth daily before breakfast.  . mirtazapine (REMERON) 45 MG tablet TAKE 1 TABLET AT BEDTIME  . Multiple Vitamins-Minerals (ICAPS AREDS 2 PO) Take by mouth.  . senna-docusate (SENOKOT-S) 8.6-50 MG per tablet Take 2-4 tablets by mouth 2 (two) times daily.     Allergies:   Patient has no known allergies.   Social History   Socioeconomic History  . Marital status: Married    Spouse name: Not on file  . Number of children: Not on file  . Years of education: Not on file  . Highest education level: Not on file  Occupational History  . Occupation: retired- Production manager of deeds for [employer]  Tobacco Use  . Smoking status: Former Smoker    Quit date: 06/03/1978    Years since quitting: 41.5  .  Smokeless tobacco: Never Used  Vaping Use  . Vaping Use: Never used  Substance and Sexual Activity  . Alcohol use: Yes    Alcohol/week: 0.0 standard drinks    Comment: wine  . Drug use: No  . Sexual activity: Not on file  Other Topics Concern  . Not on file  Social History Narrative   Requests DNR --order done 12/24/10. Currently keeping it in draw, would accept resuscitation for now   Has living will   Husband is health care POA---alternate would be nieces (both sides)   No tube feedings if cognitively unaware   Social Determinants of Health   Financial Resource Strain:   . Difficulty of Paying  Living Expenses:   Food Insecurity:   . Worried About Charity fundraiser in the Last Year:   . Arboriculturist in the Last Year:   Transportation Needs:   . Film/video editor (Medical):   Marland Kitchen Lack of Transportation (Non-Medical):   Physical Activity:   . Days of Exercise per Week:   . Minutes of Exercise per Session:   Stress:   . Feeling of Stress :   Social Connections:   . Frequency of Communication with Friends and Family:   . Frequency of Social Gatherings with Friends and Family:   . Attends Religious Services:   . Active Member of Clubs or Organizations:   . Attends Archivist Meetings:   Marland Kitchen Marital Status:      Family History: The patient's family history includes Diabetes in her brother; Hypertension in her father; Stroke in her brother. There is no history of Cancer.  ROS:   Please see the history of present illness.     All other systems reviewed and are negative.  EKGs/Labs/Other Studies Reviewed:    The following studies were reviewed today:   EKG:  EKG is  ordered today.  The ekg ordered today demonstrates normal sinus rhythm, normal ECG.  Recent Labs: 11/14/2019: ALT 27; BUN 26; Creatinine, Ser 0.98; Hemoglobin 11.8; Magnesium 2.0; Platelets 252; Potassium 4.1; Sodium 134 11/15/2019: TSH 7.397  Recent Lipid Panel    Component Value Date/Time   CHOL 185 11/15/2019 0329   TRIG 39 11/15/2019 0329   HDL 86 11/15/2019 0329   CHOLHDL 2.2 11/15/2019 0329   VLDL 8 11/15/2019 0329   LDLCALC 91 11/15/2019 0329   LDLDIRECT 161.5 11/30/2012 1553    Physical Exam:    VS:  BP 122/78 (BP Location: Right Arm, Patient Position: Sitting, Cuff Size: Normal)   Pulse 68   Ht 5\' 4"  (1.626 m)   Wt 128 lb 8 oz (58.3 kg)   SpO2 97%   BMI 22.06 kg/m     Wt Readings from Last 3 Encounters:  12/03/19 128 lb 8 oz (58.3 kg)  11/24/19 129 lb (58.5 kg)  11/14/19 134 lb (60.8 kg)     GEN:  Well nourished, well developed in no acute distress HEENT:  Normal NECK: No JVD; No carotid bruits LYMPHATICS: No lymphadenopathy CARDIAC: RRR, no murmurs, rubs, gallops RESPIRATORY:  Clear to auscultation without rales, wheezing or rhonchi  ABDOMEN: Soft, non-tender, non-distended MUSCULOSKELETAL:  No edema; No deformity  SKIN: Warm and dry NEUROLOGIC:  Alert and oriented x 3 PSYCHIATRIC:  Normal affect   ASSESSMENT:    1. Paroxysmal SVT (supraventricular tachycardia) ([place])   2. TIA (transient ischemic attack)    PLAN:    In order of problems listed above:  1. Patient with history of  paroxysmal SVT.  Currently in sinus rhythm.  Start Toprol-XL 25 mg daily.  Last echocardiogram with normal systolic function, EF 60 to 65%. 2. History of TIA, bilateral carotid artery plaque.  No significant stenosis.  Continue aspirin 81 mg, Lipitor as prescribed.  Follow-up in 6 months.   Medication Adjustments/Labs and Tests Ordered: Current medicines are reviewed at length with the patient today.  Concerns regarding medicines are outlined above.  Orders Placed This Encounter  Procedures  . EKG 12-Lead   Meds ordered this encounter  Medications  . metoprolol succinate (TOPROL-XL) 50 MG 24 hr tablet    Sig: Take 0.5 tablets (25 mg total) by mouth daily. Take with or immediately following a meal.    Dispense:  30 tablet    Refill:  6    Patient Instructions  Medication Instructions:   Your physician has recommended you make the following change in your medication:   Start Toprol XL 25mg : Take 0.5 tablets (25 mg total) by mouth daily  *If you need a refill on your cardiac medications before your next appointment, please call your pharmacy*   Lab Work: None Ordered If you have labs (blood work) drawn today and your tests are completely normal, you will receive your results only by: Marland Kitchen MyChart Message (if you have MyChart) OR . A paper copy in the mail If you have any lab test that is abnormal or we need to change your treatment, we will call  you to review the results.   Testing/Procedures: None Ordered   Follow-Up: At [facility], you and your health needs are our priority.  As part of our continuing mission to provide you with exceptional heart care, we have created designated Provider Care Teams.  These Care Teams include your primary Cardiologist (physician) and Advanced Practice Providers (APPs -  Physician Assistants and Nurse Practitioners) who all work together to provide you with the care you need, when you need it.  We recommend signing up for the patient portal called "MyChart".  Sign up information is provided on this After Visit Summary.  MyChart is used to connect with patients for Virtual Visits (Telemedicine).  Patients are able to view lab/test results, encounter notes, upcoming appointments, etc.  Non-urgent messages can be sent to your provider as well.   To learn more about what you can do with MyChart, go to [email or web address].    Your next appointment:   6 month(s)  The format for your next appointment:   In Person  Provider:   Kate Sable, MD   Other Instructions  Metoprolol Extended-Release Tablets What is this medicine? METOPROLOL (me TOE proe lole) is a beta blocker. It decreases the amount of work your heart has to do and helps your heart beat regularly. It treats high blood pressure and/or prevent chest pain (also called angina). It also treats heart failure. This medicine may be used for other purposes; ask your health care provider or pharmacist if you have questions. COMMON BRAND NAME(S): toprol, Toprol XL What should I tell my health care provider before I take this medicine? They need to know if you have any of these conditions:  diabetes  heart or vessel disease like slow heart rate, worsening heart failure, heart block, sick sinus syndrome or Raynaud's disease  kidney disease  liver disease  lung or breathing disease, like asthma or  emphysema  pheochromocytoma  thyroid disease  an unusual or allergic reaction to metoprolol, other beta-blockers, medicines, foods, dyes, or preservatives  pregnant or  trying to get pregnant  breast-feeding How should I use this medicine? Take this drug by mouth. Take it as directed on the prescription label at the same time every day. Take it with food. You may cut the tablet in half if it is scored (has a line in the middle of it). This may help you swallow the tablet if the whole tablet is too big. Be sure to take both halves. Do not take just one-half of the tablet. Keep taking it unless your health care provider tells you to stop. Talk to your health care provider about the use of this drug in children. While it may be prescribed for children as young as 6 for selected conditions, precautions do apply. Overdosage: If you think you have taken too much of this medicine contact a poison control center or emergency room at once. NOTE: This medicine is only for you. Do not share this medicine with others. What if I miss a dose? If you miss a dose, take it as soon as you can. If it is almost time for your next dose, take only that dose. Do not take double or extra doses. What may interact with this medicine? This medicine may interact with the following medications:  certain medicines for blood pressure, heart disease, irregular heart beat  certain medicines for depression, like monoamine oxidase (MAO) inhibitors, fluoxetine, or paroxetine  clonidine  dobutamine  epinephrine  isoproterenol  reserpine This list may not describe all possible interactions. Give your health care provider a list of all the medicines, herbs, non-prescription drugs, or dietary supplements you use. Also tell them if you smoke, drink alcohol, or use illegal drugs. Some items may interact with your medicine. What should I watch for while using this medicine? Visit your doctor or health care professional for  regular check ups. Contact your doctor right away if your symptoms worsen. Check your blood pressure and pulse rate regularly. Ask your health care professional what your blood pressure and pulse rate should be, and when you should contact them. You may get drowsy or dizzy. Do not drive, use machinery, or do anything that needs mental alertness until you know how this medicine affects you. Do not sit or stand up quickly, especially if you are an older patient. This reduces the risk of dizzy or fainting spells. Contact your doctor if these symptoms continue. Alcohol may interfere with the effect of this medicine. Avoid alcoholic drinks. This medicine may increase blood sugar. Ask your healthcare provider if changes in diet or medicines are needed if you have diabetes. What side effects may I notice from receiving this medicine? Side effects that you should report to your doctor or health care professional as soon as possible:  allergic reactions like skin rash, itching or hives  cold or numb hands or feet  depression  difficulty breathing  faint  fever with sore throat  irregular heartbeat, chest pain  rapid weight gain   signs and symptoms of high blood sugar such as being more thirsty or hungry or having to urinate more than normal. You may also feel very tired or have blurry vision.  swollen legs or ankles Side effects that usually do not require medical attention (report to your doctor or health care professional if they continue or are bothersome):  anxiety or nervousness  change in sex drive or performance  dry skin  headache  nightmares or trouble sleeping  short term memory loss  stomach upset or diarrhea This list may  not describe all possible side effects. Call your doctor for medical advice about side effects. You may report side effects to FDA at 1-800-FDA-1088. Where should I keep my medicine? Keep out of the reach of children and pets. Store at room  temperature between 20 and 25 degrees C (68 and 77 degrees F). Throw away any unused drug after the expiration date. NOTE: This sheet is a summary. It may not cover all possible information. If you have questions about this medicine, talk to your doctor, pharmacist, or health care provider.  2020 Elsevier/Gold Standard (2018-12-31 18:23:00)      Signed, Kate Sable, MD  12/03/2019 5:26 PM    [place]

## 2019-12-03 NOTE — Patient Instructions (Addendum)
Medication Instructions:   Your physician has recommended you make the following change in your medication:   Start Toprol XL 25mg : Take 0.5 tablets (25 mg total) by mouth daily  *If you need a refill on your cardiac medications before your next appointment, please call your pharmacy*   Lab Work: None Ordered If you have labs (blood work) drawn today and your tests are completely normal, you will receive your results only by: Marland Kitchen MyChart Message (if you have MyChart) OR . A paper copy in the mail If you have any lab test that is abnormal or we need to change your treatment, we will call you to review the results.   Testing/Procedures: None Ordered   Follow-Up: At [facility], you and your health needs are our priority.  As part of our continuing mission to provide you with exceptional heart care, we have created designated Provider Care Teams.  These Care Teams include your primary Cardiologist (physician) and Advanced Practice Providers (APPs -  Physician Assistants and Nurse Practitioners) who all work together to provide you with the care you need, when you need it.  We recommend signing up for the patient portal called "MyChart".  Sign up information is provided on this After Visit Summary.  MyChart is used to connect with patients for Virtual Visits (Telemedicine).  Patients are able to view lab/test results, encounter notes, upcoming appointments, etc.  Non-urgent messages can be sent to your provider as well.   To learn more about what you can do with MyChart, go to [email or web address].    Your next appointment:   6 month(s)  The format for your next appointment:   In Person  Provider:   Kate Sable, MD   Other Instructions  Metoprolol Extended-Release Tablets What is this medicine? METOPROLOL (me TOE proe lole) is a beta blocker. It decreases the amount of work your heart has to do and helps your heart beat regularly. It treats high blood pressure and/or  prevent chest pain (also called angina). It also treats heart failure. This medicine may be used for other purposes; ask your health care provider or pharmacist if you have questions. COMMON BRAND NAME(S): toprol, Toprol XL What should I tell my health care provider before I take this medicine? They need to know if you have any of these conditions:  diabetes  heart or vessel disease like slow heart rate, worsening heart failure, heart block, sick sinus syndrome or Raynaud's disease  kidney disease  liver disease  lung or breathing disease, like asthma or emphysema  pheochromocytoma  thyroid disease  an unusual or allergic reaction to metoprolol, other beta-blockers, medicines, foods, dyes, or preservatives  pregnant or trying to get pregnant  breast-feeding How should I use this medicine? Take this drug by mouth. Take it as directed on the prescription label at the same time every day. Take it with food. You may cut the tablet in half if it is scored (has a line in the middle of it). This may help you swallow the tablet if the whole tablet is too big. Be sure to take both halves. Do not take just one-half of the tablet. Keep taking it unless your health care provider tells you to stop. Talk to your health care provider about the use of this drug in children. While it may be prescribed for children as young as 6 for selected conditions, precautions do apply. Overdosage: If you think you have taken too much of this medicine contact a poison control center  or emergency room at once. NOTE: This medicine is only for you. Do not share this medicine with others. What if I miss a dose? If you miss a dose, take it as soon as you can. If it is almost time for your next dose, take only that dose. Do not take double or extra doses. What may interact with this medicine? This medicine may interact with the following medications:  certain medicines for blood pressure, heart disease, irregular  heart beat  certain medicines for depression, like monoamine oxidase (MAO) inhibitors, fluoxetine, or paroxetine  clonidine  dobutamine  epinephrine  isoproterenol  reserpine This list may not describe all possible interactions. Give your health care provider a list of all the medicines, herbs, non-prescription drugs, or dietary supplements you use. Also tell them if you smoke, drink alcohol, or use illegal drugs. Some items may interact with your medicine. What should I watch for while using this medicine? Visit your doctor or health care professional for regular check ups. Contact your doctor right away if your symptoms worsen. Check your blood pressure and pulse rate regularly. Ask your health care professional what your blood pressure and pulse rate should be, and when you should contact them. You may get drowsy or dizzy. Do not drive, use machinery, or do anything that needs mental alertness until you know how this medicine affects you. Do not sit or stand up quickly, especially if you are an older patient. This reduces the risk of dizzy or fainting spells. Contact your doctor if these symptoms continue. Alcohol may interfere with the effect of this medicine. Avoid alcoholic drinks. This medicine may increase blood sugar. Ask your healthcare provider if changes in diet or medicines are needed if you have diabetes. What side effects may I notice from receiving this medicine? Side effects that you should report to your doctor or health care professional as soon as possible:  allergic reactions like skin rash, itching or hives  cold or numb hands or feet  depression  difficulty breathing  faint  fever with sore throat  irregular heartbeat, chest pain  rapid weight gain   signs and symptoms of high blood sugar such as being more thirsty or hungry or having to urinate more than normal. You may also feel very tired or have blurry vision.  swollen legs or ankles Side effects  that usually do not require medical attention (report to your doctor or health care professional if they continue or are bothersome):  anxiety or nervousness  change in sex drive or performance  dry skin  headache  nightmares or trouble sleeping  short term memory loss  stomach upset or diarrhea This list may not describe all possible side effects. Call your doctor for medical advice about side effects. You may report side effects to FDA at 1-800-FDA-1088. Where should I keep my medicine? Keep out of the reach of children and pets. Store at room temperature between 20 and 25 degrees C (68 and 77 degrees F). Throw away any unused drug after the expiration date. NOTE: This sheet is a summary. It may not cover all possible information. If you have questions about this medicine, talk to your doctor, pharmacist, or health care provider.  2020 Elsevier/Gold Standard (2018-12-31 18:23:00)

## 2019-12-23 ENCOUNTER — Other Ambulatory Visit: Payer: Self-pay | Admitting: Internal Medicine

## 2020-01-18 ENCOUNTER — Ambulatory Visit (INDEPENDENT_AMBULATORY_CARE_PROVIDER_SITE_OTHER): Payer: Medicare Other | Admitting: Internal Medicine

## 2020-01-18 ENCOUNTER — Other Ambulatory Visit: Payer: Self-pay

## 2020-01-18 ENCOUNTER — Encounter: Payer: Self-pay | Admitting: Internal Medicine

## 2020-01-18 VITALS — BP 128/80 | HR 68 | Temp 97.1°F | Ht 63.0 in | Wt 126.0 lb

## 2020-01-18 DIAGNOSIS — I471 Supraventricular tachycardia, unspecified: Secondary | ICD-10-CM

## 2020-01-18 DIAGNOSIS — Z0000 Encounter for general adult medical examination without abnormal findings: Secondary | ICD-10-CM

## 2020-01-18 DIAGNOSIS — Z8673 Personal history of transient ischemic attack (TIA), and cerebral infarction without residual deficits: Secondary | ICD-10-CM

## 2020-01-18 DIAGNOSIS — F39 Unspecified mood [affective] disorder: Secondary | ICD-10-CM | POA: Diagnosis not present

## 2020-01-18 DIAGNOSIS — I6523 Occlusion and stenosis of bilateral carotid arteries: Secondary | ICD-10-CM

## 2020-01-18 DIAGNOSIS — I700 Atherosclerosis of aorta: Secondary | ICD-10-CM | POA: Diagnosis not present

## 2020-01-18 DIAGNOSIS — Z7189 Other specified counseling: Secondary | ICD-10-CM | POA: Diagnosis not present

## 2020-01-18 MED ORDER — ATORVASTATIN CALCIUM 10 MG PO TABS: 10.0000 mg | ORAL_TABLET | Freq: Every day | ORAL | 3 refills | Status: DC

## 2020-01-18 NOTE — Assessment & Plan Note (Signed)
Has DNR sheet but still uncertain about this

## 2020-01-18 NOTE — Assessment & Plan Note (Signed)
I have personally reviewed the Medicare Annual Wellness questionnaire and have noted 1. The patient's medical and social history 2. Their use of alcohol, tobacco or illicit drugs 3. Their current medications and supplements 4. The patient's functional ability including ADL's, fall risks, home safety risks and hearing or visual             impairment. 5. Diet and physical activities 6. Evidence for depression or mood disorders  The patients weight, height, BMI and visual acuity have been recorded in the chart I have made referrals, counseling and provided education to the patient based review of the above and I have provided the pt with a written personalized care plan for preventive services.  I have provided you with a copy of your personalized plan for preventive services. Please take the time to review along with your updated medication list.  No cancer screening due to age Regular exercise Flu vaccine in the fall Td if any injury

## 2020-01-18 NOTE — Progress Notes (Signed)
Hearing Screening   Method: Audiometry   125Hz  250Hz  500Hz  1000Hz  2000Hz  3000Hz  4000Hz  6000Hz  8000Hz   Right ear:   20 25 25   0    Left ear:   25 25 25   0    Vision Screening Comments: June 2021

## 2020-01-18 NOTE — Assessment & Plan Note (Signed)
Has had stress with hospital stay, husband's issues, etc No persistent depression Is on mirtazapine for sleep

## 2020-01-18 NOTE — Assessment & Plan Note (Signed)
No recurrence on the metoprolol 

## 2020-01-18 NOTE — Assessment & Plan Note (Signed)
Radiographic diagnosis Is on ASA and statin

## 2020-01-18 NOTE — Assessment & Plan Note (Signed)
No residual issues On ASA and statin

## 2020-01-18 NOTE — Progress Notes (Signed)
Subjective:    Patient ID: Robin Logan, female    DOB: 05/05/1936, 84 y.o.   MRN: [id number]  HPI Here for Medicare wellness visit and follow up of chronic health conditions This visit occurred during the SARS-CoV-2 public health emergency.  Safety protocols were in place, including screening questions prior to the visit, additional usage of staff PPE, and extensive cleaning of exam room while observing appropriate contact time as indicated for disinfecting solutions.   Reviewed form and advanced directives Reviewed other doctors Rare drink of alcohol No tobacco Regular exercise Vision is fine Mild hearing issues--nothing significant No falls Some depressed mood from illness, etc. Not anhedonic Independent with instrumental ADLs No sig memory issues  "I don't feel like me anymore-----I feel my age" Stress with husband's disabilities (hard to get around due to bad knee) No neurologic symptoms  Never had palpitations No chest pain or SOB No edema No dizziness or syncope  Current Outpatient Medications on File Prior to Visit  Medication Sig Dispense Refill  . Ascorbic Acid (VITAMIN C) 1000 MG tablet Take 1,000 mg by mouth daily.    Marland Kitchen aspirin EC 81 MG tablet Take 81 mg by mouth daily.    Marland Kitchen atorvastatin (LIPITOR) 10 MG tablet Take 1 tablet (10 mg total) by mouth daily. 90 tablet 3  . cholecalciferol (VITAMIN D) 1000 UNITS tablet Take 1,000 Units by mouth daily.      Marland Kitchen ibuprofen (ADVIL,MOTRIN) 200 MG tablet Take 600 mg by mouth at bedtime as needed. For restless legs symptoms    . levothyroxine (SYNTHROID) 88 MCG tablet TAKE 1 TABLET DAILY 90 tablet 3  . metoprolol succinate (TOPROL-XL) 50 MG 24 hr tablet Take 0.5 tablets (25 mg total) by mouth daily. Take with or immediately following a meal. 30 tablet 6  . mirtazapine (REMERON) 45 MG tablet TAKE 1 TABLET AT BEDTIME 90 tablet 3  . Multiple Vitamins-Minerals (ICAPS AREDS 2 PO) Take by mouth.    . senna-docusate (SENOKOT-S)  8.6-50 MG per tablet Take 2-4 tablets by mouth 2 (two) times daily.     No current facility-administered medications on file prior to visit.    No Known Allergies  Past Medical History:  Diagnosis Date  . Arthritis   . Depression   . Hyperlipidemia   . Osteopenia   . Osteopenia   . Sleep disorder   . Thyroid disease     Past Surgical History:  Procedure Laterality Date  . ABDOMINAL HYSTERECTOMY    . APPENDECTOMY    . CATARACT EXTRACTION W/PHACO Right 03/03/2019   Procedure: CATARACT EXTRACTION PHACO AND INTRAOCULAR LENS PLACEMENT (IOC) RIGHT  00:44.0  16.1%  7.12;  Surgeon: Leandrew Koyanagi, MD;  Location: [place];  Service: Ophthalmology;  Laterality: Right;  . CATARACT EXTRACTION W/PHACO Left 03/24/2019   Procedure: CATARACT EXTRACTION PHACO AND INTRAOCULAR LENS PLACEMENT (IOC) LEFT  01:09.4  12.5%  8.73;  Surgeon: Leandrew Koyanagi, MD;  Location: [facility];  Service: Ophthalmology;  Laterality: Left;  . TONSILLECTOMY AND ADENOIDECTOMY  1946    Family History  Problem Relation Age of Onset  . Hypertension Father   . Stroke Brother   . Diabetes Brother   . Cancer Neg Hx        no breast or colon    Social History   Socioeconomic History  . Marital status: Married    Spouse name: Not on file  . Number of children: Not on file  . Years of education: Not  on file  . Highest education level: Not on file  Occupational History  . Occupation: retired- Production manager of [employer]  Tobacco Use  . Smoking status: Former Smoker    Quit date: 06/03/1978    Years since quitting: 41.6  . Smokeless tobacco: Never Used  Vaping Use  . Vaping Use: Never used  Substance and Sexual Activity  . Alcohol use: Yes    Alcohol/week: 0.0 standard drinks    Comment: wine  . Drug use: No  . Sexual activity: Not on file  Other Topics Concern  . Not on file  Social History Narrative   Requests DNR --order done 12/24/10. Currently keeping it in  draw, would accept resuscitation for now   Has living will   Husband is health care POA---alternate would be nieces (both sides)   No tube feedings if cognitively unaware   Social Determinants of Health   Financial Resource Strain:   . Difficulty of Paying Living Expenses:   Food Insecurity:   . Worried About Charity fundraiser in the Last Year:   . Arboriculturist in the Last Year:   Transportation Needs:   . Film/video editor (Medical):   Marland Kitchen Lack of Transportation (Non-Medical):   Physical Activity:   . Days of Exercise per Week:   . Minutes of Exercise per Session:   Stress:   . Feeling of Stress :   Social Connections:   . Frequency of Communication with Friends and Family:   . Frequency of Social Gatherings with Friends and Family:   . Attends Religious Services:   . Active Member of Clubs or Organizations:   . Attends Archivist Meetings:   Marland Kitchen Marital Status:   Intimate Partner Violence:   . Fear of Current or Ex-Partner:   . Emotionally Abused:   Marland Kitchen Physically Abused:   . Sexually Abused:    Review of Systems Sleep is all messed up--feels some tiredness Had some leg pain--but better now Appetite is good Weight stable Wears seat belt Teeth are okay---keeps up with dentist No rash or skin lesions. No dermatologist thus far No heartburn or dysphagia Chronic constipation--senna and prn dulcolax No sig back or joint pains    Objective:   Physical Exam Constitutional:      Appearance: Normal appearance.  HENT:     Mouth/Throat:     Comments: No lesions Eyes:     Conjunctiva/sclera: Conjunctivae normal.     Pupils: Pupils are equal, round, and reactive to light.  Cardiovascular:     Rate and Rhythm: Normal rate and regular rhythm.     Pulses: Normal pulses.     Heart sounds: Normal heart sounds. No murmur heard.  No gallop.   Pulmonary:     Effort: Pulmonary effort is normal.     Breath sounds: Normal breath sounds. No wheezing or rales.    Abdominal:     Palpations: Abdomen is soft.     Tenderness: There is no abdominal tenderness.  Musculoskeletal:     Cervical back: Neck supple.     Right lower leg: No edema.     Left lower leg: No edema.  Lymphadenopathy:     Cervical: No cervical adenopathy.  Skin:    Findings: No rash.  Neurological:     Mental Status: She is alert and oriented to person, place, and time.     Comments: President--- "Zoila Shutter, Bush---Obama" [phone] [id number] Recall 3/3  Psychiatric:  Mood and Affect: Mood normal.        Behavior: Behavior normal.            Assessment & Plan:

## 2020-02-14 ENCOUNTER — Other Ambulatory Visit: Payer: Self-pay | Admitting: Internal Medicine

## 2020-03-24 ENCOUNTER — Other Ambulatory Visit: Payer: Self-pay | Admitting: Internal Medicine

## 2020-03-28 ENCOUNTER — Encounter: Payer: Self-pay | Admitting: Internal Medicine

## 2020-03-28 ENCOUNTER — Ambulatory Visit (INDEPENDENT_AMBULATORY_CARE_PROVIDER_SITE_OTHER): Payer: Medicare Other | Admitting: Internal Medicine

## 2020-03-28 ENCOUNTER — Other Ambulatory Visit: Payer: Self-pay

## 2020-03-28 DIAGNOSIS — I6523 Occlusion and stenosis of bilateral carotid arteries: Secondary | ICD-10-CM

## 2020-03-28 DIAGNOSIS — S2001XA Contusion of right breast, initial encounter: Secondary | ICD-10-CM | POA: Insufficient documentation

## 2020-03-28 NOTE — Progress Notes (Signed)
Subjective:    Patient ID: Robin Logan, female    DOB: 03/19/1936, 84 y.o.   MRN: [id number]  HPI Here due to breast pain and mass This visit occurred during the SARS-CoV-2 public health emergency.  Safety protocols were in place, including screening questions prior to the visit, additional usage of staff PPE, and extensive cleaning of exam room while observing appropriate contact time as indicated for disinfecting solutions.   About 3 weeks ago, she noticed a bruise on her right breast Classic purplish then yellow/green Bruise is better but still feels a lump there Never had pain--just some soreness  No clear injury--but does do yoga (and sometimes has positions on chest)  Current Outpatient Medications on File Prior to Visit  Medication Sig Dispense Refill  . Ascorbic Acid (VITAMIN C) 1000 MG tablet Take 1,000 mg by mouth daily.    Marland Kitchen aspirin EC 81 MG tablet Take 81 mg by mouth daily.    Marland Kitchen atorvastatin (LIPITOR) 10 MG tablet Take 1 tablet (10 mg total) by mouth daily. 90 tablet 3  . cholecalciferol (VITAMIN D) 1000 UNITS tablet Take 1,000 Units by mouth daily.      Marland Kitchen ibuprofen (ADVIL,MOTRIN) 200 MG tablet Take 600 mg by mouth at bedtime as needed. For restless legs symptoms    . levothyroxine (SYNTHROID) 88 MCG tablet TAKE 1 TABLET DAILY 90 tablet 3  . mirtazapine (REMERON) 45 MG tablet TAKE 1 TABLET AT BEDTIME 90 tablet 3  . Multiple Vitamins-Minerals (ICAPS AREDS 2 PO) Take by mouth.    . senna-docusate (SENOKOT-S) 8.6-50 MG per tablet Take 2-4 tablets by mouth 2 (two) times daily.    . metoprolol succinate (TOPROL-XL) 50 MG 24 hr tablet Take 0.5 tablets (25 mg total) by mouth daily. Take with or immediately following a meal. 30 tablet 6   No current facility-administered medications on file prior to visit.    No Known Allergies  Past Medical History:  Diagnosis Date  . Arthritis   . Depression   . Hyperlipidemia   . Osteopenia   . Osteopenia   . Sleep disorder   .  Thyroid disease     Past Surgical History:  Procedure Laterality Date  . ABDOMINAL HYSTERECTOMY    . APPENDECTOMY    . CATARACT EXTRACTION W/PHACO Right 03/03/2019   Procedure: CATARACT EXTRACTION PHACO AND INTRAOCULAR LENS PLACEMENT (IOC) RIGHT  00:44.0  16.1%  7.12;  Surgeon: Leandrew Koyanagi, MD;  Location: [facility];  Service: Ophthalmology;  Laterality: Right;  . CATARACT EXTRACTION W/PHACO Left 03/24/2019   Procedure: CATARACT EXTRACTION PHACO AND INTRAOCULAR LENS PLACEMENT (IOC) LEFT  01:09.4  12.5%  8.73;  Surgeon: Leandrew Koyanagi, MD;  Location: [facility];  Service: Ophthalmology;  Laterality: Left;  . TONSILLECTOMY AND ADENOIDECTOMY  1946    Family History  Problem Relation Age of Onset  . Hypertension Father   . Stroke Brother   . Diabetes Brother   . Cancer Neg Hx        no breast or colon    Social History   Socioeconomic History  . Marital status: Married    Spouse name: Not on file  . Number of children: Not on file  . Years of education: Not on file  . Highest education level: Not on file  Occupational History  . Occupation: retired- Production manager of [employer]  Tobacco Use  . Smoking status: Former Smoker    Quit date: 06/03/1978    Years  since quitting: 41.8  . Smokeless tobacco: Never Used  Vaping Use  . Vaping Use: Never used  Substance and Sexual Activity  . Alcohol use: Yes    Alcohol/week: 0.0 standard drinks    Comment: wine  . Drug use: No  . Sexual activity: Not on file  Other Topics Concern  . Not on file  Social History Narrative   Requests DNR --order done 12/24/10. Currently keeping it in draw, would accept resuscitation for now   Has living will   Husband is health care POA---alternate would be nieces (both sides)   No tube feedings if cognitively unaware   Social Determinants of Health   Financial Resource Strain:   . Difficulty of Paying Living Expenses: Not on file  Food Insecurity:   .  Worried About Charity fundraiser in the Last Year: Not on file  . Ran Out of Food in the Last Year: Not on file  Transportation Needs:   . Lack of Transportation (Medical): Not on file  . Lack of Transportation (Non-Medical): Not on file  Physical Activity:   . Days of Exercise per Week: Not on file  . Minutes of Exercise per Session: Not on file  Stress:   . Feeling of Stress : Not on file  Social Connections:   . Frequency of Communication with Friends and Family: Not on file  . Frequency of Social Gatherings with Friends and Family: Not on file  . Attends Religious Services: Not on file  . Active Member of Clubs or Organizations: Not on file  . Attends Archivist Meetings: Not on file  . Marital Status: Not on file  Intimate Partner Violence:   . Fear of Current or Ex-Partner: Not on file  . Emotionally Abused: Not on file  . Physically Abused: Not on file  . Sexually Abused: Not on file   Review of Systems Not sick Tired at times--not sleeping well No fever Weight is stable    Objective:   Physical Exam Genitourinary:    Comments: Normal left breast---some cystic periareolar tissue  Right breast has yellowed area at 10 o'clock ~3cm from nipple. Just above this is well circumscribed mass (6-60mm) and some diffuse mildly tender thickening Lymphadenopathy:     Upper Body:     Right upper body: No axillary adenopathy.     Left upper body: No axillary adenopathy.            Assessment & Plan:

## 2020-03-28 NOTE — Patient Instructions (Signed)
Let me know if the lump is not going away by 1 month from now (and I will then order the mammogram).

## 2020-03-28 NOTE — Assessment & Plan Note (Signed)
Clearly has a bruise that is resolving Mass feels like a fibroadenoma---not typical for blood (but possible if bleeding in a cyst) Will wait 1 month---if not resolved then, will proceed with diagnostic mammogram (and U/S)

## 2020-04-18 DIAGNOSIS — Z23 Encounter for immunization: Secondary | ICD-10-CM | POA: Diagnosis not present

## 2020-05-01 ENCOUNTER — Encounter: Payer: Self-pay | Admitting: Podiatry

## 2020-05-01 ENCOUNTER — Other Ambulatory Visit: Payer: Self-pay

## 2020-05-01 ENCOUNTER — Ambulatory Visit (INDEPENDENT_AMBULATORY_CARE_PROVIDER_SITE_OTHER): Payer: Medicare Other | Admitting: Podiatry

## 2020-05-01 DIAGNOSIS — B351 Tinea unguium: Secondary | ICD-10-CM

## 2020-05-01 DIAGNOSIS — M79676 Pain in unspecified toe(s): Secondary | ICD-10-CM | POA: Diagnosis not present

## 2020-05-01 NOTE — Progress Notes (Signed)
This patient returns to the office for evaluation and treatment of long thick painful nails .  This patient is unable to trim her own nails since the patient cannot reach her feet.  Patient says the nails are painful walking and wearing her shoes.  She returns for preventive foot care services.  General Appearance  Alert, conversant and in no acute stress.  Vascular  Dorsalis pedis and posterior tibial  pulses are palpable  bilaterally.  Capillary return is within normal limits  bilaterally. Temperature is within normal limits  bilaterally.  Neurologic  Senn-Weinstein monofilament wire test within normal limits  bilaterally. Muscle power within normal limits bilaterally.  Nails Thick disfigured discolored nails with subungual debris  Hallux nail right foot.. No evidence of bacterial infection or drainage bilaterally.  Orthopedic  No limitations of motion  feet .  No crepitus or effusions noted.  No bony pathology or digital deformities noted.  Skin  normotropic skin with no porokeratosis noted bilaterally.  No signs of infections or ulcers noted.     Onychomycosis  Right hallux   Pain in toes right foot    Debridement  of hallux nail right foot  with a nail nipper.  Nails were then filed using a dremel tool with no incidents.    RTC prn.   Gardiner Barefoot DPM

## 2020-06-13 ENCOUNTER — Encounter: Payer: Self-pay | Admitting: Cardiology

## 2020-06-13 ENCOUNTER — Other Ambulatory Visit: Payer: Self-pay

## 2020-06-13 ENCOUNTER — Ambulatory Visit (INDEPENDENT_AMBULATORY_CARE_PROVIDER_SITE_OTHER): Payer: Medicare Other

## 2020-06-13 ENCOUNTER — Ambulatory Visit (INDEPENDENT_AMBULATORY_CARE_PROVIDER_SITE_OTHER): Payer: Medicare Other | Admitting: Cardiology

## 2020-06-13 VITALS — BP 128/72 | HR 65 | Ht 64.0 in | Wt 128.0 lb

## 2020-06-13 DIAGNOSIS — I471 Supraventricular tachycardia, unspecified: Secondary | ICD-10-CM

## 2020-06-13 DIAGNOSIS — G459 Transient cerebral ischemic attack, unspecified: Secondary | ICD-10-CM

## 2020-06-13 NOTE — Progress Notes (Signed)
Cardiology Office Note:    Date:  06/13/2020   ID:  Robin Logan, DOB 02-Dec-1935, MRN [id number]  PCP:  Venia Carbon, MD  [facility] Cardiologist:  Kate Sable, MD  [place] Electrophysiologist:  None   Referring MD: Venia Carbon, MD   Chief Complaint  Patient presents with  . Follow-up    6 Months follow up. Medications verbally reviewed with patient.     History of Present Illness:    Robin Logan is a 85 y.o. female with a hx of hyperlipidemia, paroxysmal SVT, TIA who presents for follow-up.   Patient initially seen due to TIA and right arm numbness.  Telemetry monitoring showed SVT heart rate 149, which resolved after Cardizem administration.  Patient was started on Toprol-XL after last visit.  She feels well, has no palpitations.  Tolerating Toprol-XL without any adverse effects.  No further neurological symptoms.  Prior notes Echocardiogram [id number], normal systolic function, EF 60 to 65%. Ultrasound 11/2019 bilateral arthrosclerosis plaque right greater than left.  No hemodynamically significant stenosis.    Past Medical History:  Diagnosis Date  . Arthritis   . Depression   . Hyperlipidemia   . Osteopenia   . Osteopenia   . Sleep disorder   . Thyroid disease     Past Surgical History:  Procedure Laterality Date  . ABDOMINAL HYSTERECTOMY    . APPENDECTOMY    . CATARACT EXTRACTION W/PHACO Right 03/03/2019   Procedure: CATARACT EXTRACTION PHACO AND INTRAOCULAR LENS PLACEMENT (IOC) RIGHT  00:44.0  16.1%  7.12;  Surgeon: Leandrew Koyanagi, MD;  Location: [facility];  Service: Ophthalmology;  Laterality: Right;  . CATARACT EXTRACTION W/PHACO Left 03/24/2019   Procedure: CATARACT EXTRACTION PHACO AND INTRAOCULAR LENS PLACEMENT (IOC) LEFT  01:09.4  12.5%  8.73;  Surgeon: Leandrew Koyanagi, MD;  Location: [facility];  Service: Ophthalmology;  Laterality: Left;  . TONSILLECTOMY AND ADENOIDECTOMY  1946    Current  Medications: Current Meds  Medication Sig  . Ascorbic Acid (VITAMIN C) 1000 MG tablet Take 1,000 mg by mouth daily.  Marland Kitchen aspirin EC 81 MG tablet Take 81 mg by mouth daily.  Marland Kitchen atorvastatin (LIPITOR) 10 MG tablet Take 1 tablet (10 mg total) by mouth daily.  . cholecalciferol (VITAMIN D) 1000 UNITS tablet Take 1,000 Units by mouth daily.  Marland Kitchen ibuprofen (ADVIL,MOTRIN) 200 MG tablet Take 600 mg by mouth at bedtime as needed. For restless legs symptoms  . levothyroxine (SYNTHROID) 88 MCG tablet TAKE 1 TABLET DAILY  . metoprolol succinate (TOPROL-XL) 50 MG 24 hr tablet Take 0.5 tablets (25 mg total) by mouth daily. Take with or immediately following a meal.  . mirtazapine (REMERON) 45 MG tablet TAKE 1 TABLET AT BEDTIME  . Multiple Vitamins-Minerals (ICAPS AREDS 2 PO) Take by mouth.  . senna-docusate (SENOKOT-S) 8.6-50 MG per tablet Take 2-4 tablets by mouth 2 (two) times daily.     Allergies:   Patient has no known allergies.   Social History   Socioeconomic History  . Marital status: Married    Spouse name: Not on file  . Number of children: Not on file  . Years of education: Not on file  . Highest education level: Not on file  Occupational History  . Occupation: retired- Production manager of [employer]  Tobacco Use  . Smoking status: Former Smoker    Quit date: 06/03/1978    Years since quitting: 42.0  . Smokeless tobacco: Never Used  Vaping Use  .  Vaping Use: Never used  Substance and Sexual Activity  . Alcohol use: Yes    Alcohol/week: 0.0 standard drinks    Comment: wine  . Drug use: No  . Sexual activity: Not on file  Other Topics Concern  . Not on file  Social History Narrative   Requests DNR --order done 12/24/10. Currently keeping it in draw, would accept resuscitation for now   Has living will   Husband is health care POA---alternate would be nieces (both sides)   No tube feedings if cognitively unaware   Social Determinants of Health   Financial Resource Strain:  Not on file  Food Insecurity: Not on file  Transportation Needs: Not on file  Physical Activity: Not on file  Stress: Not on file  Social Connections: Not on file     Family History: The patient's family history includes Diabetes in her brother; Hypertension in her father; Stroke in her brother. There is no history of Cancer.  ROS:   Please see the history of present illness.     All other systems reviewed and are negative.  EKGs/Labs/Other Studies Reviewed:    The following studies were reviewed today:   EKG:  EKG is  ordered today.  The ekg ordered today demonstrates normal sinus rhythm  Recent Labs: 11/14/2019: ALT 27; BUN 26; Creatinine, Ser 0.98; Hemoglobin 11.8; Magnesium 2.0; Platelets 252; Potassium 4.1; Sodium 134 11/15/2019: TSH 7.397  Recent Lipid Panel    Component Value Date/Time   CHOL 185 11/15/2019 0329   TRIG 39 11/15/2019 0329   HDL 86 11/15/2019 0329   CHOLHDL 2.2 11/15/2019 0329   VLDL 8 11/15/2019 0329   LDLCALC 91 11/15/2019 0329   LDLDIRECT 161.5 11/30/2012 1553    Physical Exam:    VS:  BP 128/72 (BP Location: Left Arm, Patient Position: Sitting, Cuff Size: Normal)   Pulse 65   Ht 5\' 4"  (1.626 m)   Wt 128 lb (58.1 kg)   SpO2 95%   BMI 21.97 kg/m     Wt Readings from Last 3 Encounters:  06/13/20 128 lb (58.1 kg)  03/28/20 124 lb (56.2 kg)  01/18/20 126 lb (57.2 kg)     GEN:  Well nourished, well developed in no acute distress HEENT: Normal NECK: No JVD; No carotid bruits LYMPHATICS: No lymphadenopathy CARDIAC: RRR, no murmurs, rubs, gallops RESPIRATORY:  Clear to auscultation without rales, wheezing or rhonchi  ABDOMEN: Soft, non-tender, non-distended MUSCULOSKELETAL:  No edema; No deformity  SKIN: Warm and dry NEUROLOGIC:  Alert and oriented x 3 PSYCHIATRIC:  Normal affect   ASSESSMENT:    1. Paroxysmal SVT (supraventricular tachycardia) ([place])   2. TIA (transient ischemic attack)    PLAN:    In order of problems listed  above:  1. paroxysmal SVT.  Currently in sinus rhythm.  Continue Toprol-XL 25 mg daily.  Last echocardiogram showed normal systolic function, EF 60 to 65% and no significant abnormalities. 2. History of TIA, carotid ultrasound with bilateral carotid artery plaque,  No significant stenosis.  Continue aspirin 81 mg, Lipitor as prescribed.  Place a 2-week cardiac monitor to evaluate for any A. fib or atrial flutter.  Follow-up in 6 months.   Medication Adjustments/Labs and Tests Ordered: Current medicines are reviewed at length with the patient today.  Concerns regarding medicines are outlined above.  Orders Placed This Encounter  Procedures  . LONG TERM MONITOR (3-14 DAYS)  . EKG 12-Lead   No orders of the defined types were placed in this  encounter.   Patient Instructions  Medication Instructions:  Your physician recommends that you continue on your current medications as directed. Please refer to the Current Medication list given to you today.  *If you need a refill on your cardiac medications before your next appointment, please call your pharmacy*   Lab Work: None Ordered If you have labs (blood work) drawn today and your tests are completely normal, you will receive your results only by: Marland Kitchen MyChart Message (if you have MyChart) OR . A paper copy in the mail If you have any lab test that is abnormal or we need to change your treatment, we will call you to review the results.   Testing/Procedures:  Your physician has recommended that you wear a Zio monitor 2 weeks. This monitor is a medical device that records the heart's electrical activity. Doctors most often use these monitors to diagnose arrhythmias. Arrhythmias are problems with the speed or rhythm of the heartbeat. The monitor is a small device applied to your chest. You can wear one while you do your normal daily activities. While wearing this monitor if you have any symptoms to push the button and record what you felt. Once  you have worn this monitor for the period of time provider prescribed (Usually 14 days), you will return the monitor device in the postage paid box. Once it is returned they will download the data collected and provide [place] with a report which the provider will then review and we will call you with those results. Important tips:  1. Avoid showering during the first 24 hours of wearing the monitor. 2. Avoid excessive sweating to help maximize wear time. 3. Do not submerge the device, no hot tubs, and no swimming pools. 4. Keep any lotions or oils away from the patch. 5. After 24 hours you may shower with the patch on. Take brief showers with your back facing the shower head.  6. Do not remove patch once it has been placed because that will interrupt data and decrease adhesive wear time. 7. Push the button when you have any symptoms and write down what you were feeling. 8. Once you have completed wearing your monitor, remove and place into box which has postage paid and place in your outgoing mailbox.  9. If for some reason you have misplaced your box then call our office and we can provide another box and/or mail it off for you.         Follow-Up: At [facility], you and your health needs are our priority.  As part of our continuing mission to provide you with exceptional heart care, we have created designated Provider Care Teams.  These Care Teams include your primary Cardiologist (physician) and Advanced Practice Providers (APPs -  Physician Assistants and Nurse Practitioners) who all work together to provide you with the care you need, when you need it.  We recommend signing up for the patient portal called "MyChart".  Sign up information is provided on this After Visit Summary.  MyChart is used to connect with patients for Virtual Visits (Telemedicine).  Patients are able to view lab/test results, encounter notes, upcoming appointments, etc.  Non-urgent messages can be sent to your provider  as well.   To learn more about what you can do with MyChart, go to [email or web address].    Your next appointment:   6 months   The format for your next appointment:   In Person  Provider:   Kate Sable, MD   Other  Instructions      Signed, Kate Sable, MD  06/13/2020 9:59 AM    [facility]

## 2020-06-13 NOTE — Patient Instructions (Addendum)
Medication Instructions:  Your physician recommends that you continue on your current medications as directed. Please refer to the Current Medication list given to you today.  *If you need a refill on your cardiac medications before your next appointment, please call your pharmacy*   Lab Work: None Ordered If you have labs (blood work) drawn today and your tests are completely normal, you will receive your results only by: Marland Kitchen MyChart Message (if you have MyChart) OR . A paper copy in the mail If you have any lab test that is abnormal or we need to change your treatment, we will call you to review the results.   Testing/Procedures:  Your physician has recommended that you wear a Zio monitor 2 weeks. This monitor is a medical device that records the heart's electrical activity. Doctors most often use these monitors to diagnose arrhythmias. Arrhythmias are problems with the speed or rhythm of the heartbeat. The monitor is a small device applied to your chest. You can wear one while you do your normal daily activities. While wearing this monitor if you have any symptoms to push the button and record what you felt. Once you have worn this monitor for the period of time provider prescribed (Usually 14 days), you will return the monitor device in the postage paid box. Once it is returned they will download the data collected and provide [place] with a report which the provider will then review and we will call you with those results. Important tips:  1. Avoid showering during the first 24 hours of wearing the monitor. 2. Avoid excessive sweating to help maximize wear time. 3. Do not submerge the device, no hot tubs, and no swimming pools. 4. Keep any lotions or oils away from the patch. 5. After 24 hours you may shower with the patch on. Take brief showers with your back facing the shower head.  6. Do not remove patch once it has been placed because that will interrupt data and decrease adhesive wear  time. 7. Push the button when you have any symptoms and write down what you were feeling. 8. Once you have completed wearing your monitor, remove and place into box which has postage paid and place in your outgoing mailbox.  9. If for some reason you have misplaced your box then call our office and we can provide another box and/or mail it off for you.         Follow-Up: At [facility], you and your health needs are our priority.  As part of our continuing mission to provide you with exceptional heart care, we have created designated Provider Care Teams.  These Care Teams include your primary Cardiologist (physician) and Advanced Practice Providers (APPs -  Physician Assistants and Nurse Practitioners) who all work together to provide you with the care you need, when you need it.  We recommend signing up for the patient portal called "MyChart".  Sign up information is provided on this After Visit Summary.  MyChart is used to connect with patients for Virtual Visits (Telemedicine).  Patients are able to view lab/test results, encounter notes, upcoming appointments, etc.  Non-urgent messages can be sent to your provider as well.   To learn more about what you can do with MyChart, go to [email or web address].    Your next appointment:   6 months   The format for your next appointment:   In Person  Provider:   Kate Sable, MD   Other Instructions

## 2020-06-26 DIAGNOSIS — G459 Transient cerebral ischemic attack, unspecified: Secondary | ICD-10-CM | POA: Diagnosis not present

## 2020-06-26 DIAGNOSIS — I471 Supraventricular tachycardia: Secondary | ICD-10-CM

## 2020-07-04 DIAGNOSIS — G459 Transient cerebral ischemic attack, unspecified: Secondary | ICD-10-CM | POA: Diagnosis not present

## 2020-07-04 DIAGNOSIS — I471 Supraventricular tachycardia: Secondary | ICD-10-CM | POA: Diagnosis not present

## 2020-07-26 ENCOUNTER — Other Ambulatory Visit: Payer: Self-pay

## 2020-07-26 ENCOUNTER — Emergency Department
Admission: EM | Admit: 2020-07-26 | Discharge: 2020-07-26 | Disposition: A | Discharge status: Home / Self Care | Payer: Medicare Other | Attending: Emergency Medicine | Admitting: Emergency Medicine

## 2020-07-26 ENCOUNTER — Emergency Department: Payer: Medicare Other

## 2020-07-26 ENCOUNTER — Ambulatory Visit
Admission: EM | Admit: 2020-07-26 | Discharge: 2020-07-26 | Disposition: A | Discharge status: Home / Self Care | Payer: Medicare Other

## 2020-07-26 DIAGNOSIS — Z20822 Contact with and (suspected) exposure to covid-19: Secondary | ICD-10-CM | POA: Insufficient documentation

## 2020-07-26 DIAGNOSIS — R519 Headache, unspecified: Secondary | ICD-10-CM | POA: Diagnosis not present

## 2020-07-26 DIAGNOSIS — Z87891 Personal history of nicotine dependence: Secondary | ICD-10-CM | POA: Insufficient documentation

## 2020-07-26 DIAGNOSIS — Z7982 Long term (current) use of aspirin: Secondary | ICD-10-CM | POA: Insufficient documentation

## 2020-07-26 DIAGNOSIS — E039 Hypothyroidism, unspecified: Secondary | ICD-10-CM | POA: Diagnosis not present

## 2020-07-26 DIAGNOSIS — Z79899 Other long term (current) drug therapy: Secondary | ICD-10-CM | POA: Insufficient documentation

## 2020-07-26 DIAGNOSIS — R42 Dizziness and giddiness: Secondary | ICD-10-CM | POA: Diagnosis not present

## 2020-07-26 DIAGNOSIS — I10 Essential (primary) hypertension: Secondary | ICD-10-CM | POA: Diagnosis not present

## 2020-07-26 LAB — TROPONIN I (HIGH SENSITIVITY)
Troponin I (High Sensitivity): 7 ng/L (ref <18)
Troponin I (High Sensitivity): 8 ng/L (ref <18)

## 2020-07-26 LAB — BASIC METABOLIC PANEL
Anion gap: 8 (ref 5–15)
BUN: 26 mg/dL — ABNORMAL HIGH (ref 8–23)
CO2: 25 mmol/L (ref 22–32)
Calcium: 9 mg/dL (ref 8.9–10.3)
Chloride: 101 mmol/L (ref 98–111)
Creatinine, Ser: 1.09 mg/dL — ABNORMAL HIGH (ref 0.44–1.00)
GFR, Estimated: 51 mL/min — ABNORMAL LOW (ref >60)
Glucose, Bld: 98 mg/dL (ref 70–99)
Potassium: 4.1 mmol/L (ref 3.5–5.1)
Sodium: 134 mmol/L — ABNORMAL LOW (ref 135–145)

## 2020-07-26 LAB — URINALYSIS, COMPLETE (UACMP) WITH MICROSCOPIC
Bacteria, UA: NONE SEEN
Bilirubin Urine: NEGATIVE
Glucose, UA: NEGATIVE mg/dL
Hgb urine dipstick: NEGATIVE
Ketones, ur: 5 mg/dL — AB
Leukocytes,Ua: NEGATIVE
Nitrite: NEGATIVE
Protein, ur: NEGATIVE mg/dL
Specific Gravity, Urine: 1.009 (ref 1.005–1.030)
pH: 6 (ref 5.0–8.0)

## 2020-07-26 LAB — CBC
HCT: 34.9 % — ABNORMAL LOW (ref 36.0–46.0)
Hemoglobin: 11.7 g/dL — ABNORMAL LOW (ref 12.0–15.0)
MCH: 31.7 pg (ref 26.0–34.0)
MCHC: 33.5 g/dL (ref 30.0–36.0)
MCV: 94.6 fL (ref 80.0–100.0)
Platelets: 209 10*3/uL (ref 150–400)
RBC: 3.69 MIL/uL — ABNORMAL LOW (ref 3.87–5.11)
RDW: 12.5 % (ref 11.5–15.5)
WBC: 6.7 10*3/uL (ref 4.0–10.5)
nRBC: 0 % (ref 0.0–0.2)

## 2020-07-26 NOTE — ED Notes (Signed)
Pt c/o dizziness, change in vision, and "not feeling right" onset this morning. Denies h/o URI symptoms, SOB, CP. Per T. Bast, NP, due to pt's c/o dizziness, h/o SVT, TIA, advised pt to go to ED for higher level eval and care, CT of head. Vital signs stable. Pt left POV with husband and agreed to go to [facility] ED for eval.

## 2020-07-26 NOTE — ED Provider Notes (Signed)
[facility] Emergency Department Provider Note ____________________________________________   Event Date/Time   First MD Initiated Contact with Patient 07/26/20 2056     (approximate)  I have reviewed the triage vital signs and the nursing notes.   HISTORY  Chief Complaint Hypertension  HPI Robin Logan is a 85 y.o. female with a history of hypertension osteopenia thyroid disease  Patient reports that for about 4 days now she just had a slight feeling of almost a lightheadedness or may be a very mild headache  Very minimal.  She told her husband today after she completed yoga class and he felt like she should probably come over to get checked out.  Went to urgent care they closed, went to walk-in clinic, and we recommended she come to the ER to be evaluated, does not appear to have been actually seen by practitioner at the urgent care  She reports that she just does not quite feel right [place] describe it well.  No numbness or weakness.  No trouble doing yoga class no change in speech.  No chest pain no trouble breathing.  Otherwise just really just feels just a little off but not sure why thinks could be related to stress as they have to move out of their home into an apartment  Past Medical History:  Diagnosis Date  . Arthritis   . Depression   . Hyperlipidemia   . Osteopenia   . Osteopenia   . Sleep disorder   . Thyroid disease     Patient Active Problem List   Diagnosis Date Noted  . Contusion of right female breast 03/28/2020  . Mood disorder ([place]) 01/18/2020  . Carotid arterial disease ([place]) 11/24/2019  . Aortic atherosclerosis ([place]) 11/24/2019  . SVT (supraventricular tachycardia) ([place]) 11/15/2019  . History of transient ischemic attack (TIA) 11/05/2019  . Cerebrovascular disease 11/05/2019  . Counseling regarding advanced directives and goals of care 12/07/2013  . Osteopenia   . Routine general medical examination at a health care  facility 11/19/2011  . Other constipation 10/30/2010  . Hypothyroidism 05/01/2010  . HYPERLIPIDEMIA 05/01/2010  . DJD (degenerative joint disease), multiple sites 05/01/2010  . Sleep disorder 05/01/2010    Past Surgical History:  Procedure Laterality Date  . ABDOMINAL HYSTERECTOMY    . APPENDECTOMY    . CATARACT EXTRACTION W/PHACO Right 03/03/2019   Procedure: CATARACT EXTRACTION PHACO AND INTRAOCULAR LENS PLACEMENT (IOC) RIGHT  00:44.0  16.1%  7.12;  Surgeon: Leandrew Koyanagi, MD;  Location: [place];  Service: Ophthalmology;  Laterality: Right;  . CATARACT EXTRACTION W/PHACO Left 03/24/2019   Procedure: CATARACT EXTRACTION PHACO AND INTRAOCULAR LENS PLACEMENT (IOC) LEFT  01:09.4  12.5%  8.73;  Surgeon: Leandrew Koyanagi, MD;  Location: [place];  Service: Ophthalmology;  Laterality: Left;  . TONSILLECTOMY AND ADENOIDECTOMY  1946    Prior to Admission medications   Medication Sig Start Date End Date Taking? Authorizing Provider  Ascorbic Acid (VITAMIN C) 1000 MG tablet Take 1,000 mg by mouth daily.    [provider]  aspirin EC 81 MG tablet Take 81 mg by mouth daily.    [provider]  atorvastatin (LIPITOR) 10 MG tablet Take 1 tablet (10 mg total) by mouth daily. 01/18/20   Venia Carbon, MD  cholecalciferol (VITAMIN D) 1000 UNITS tablet Take 1,000 Units by mouth daily.    [provider]  ibuprofen (ADVIL,MOTRIN) 200 MG tablet Take 600 mg by mouth at bedtime as needed. For restless  legs symptoms    [provider]  levothyroxine (SYNTHROID) 88 MCG tablet TAKE 1 TABLET DAILY 12/23/19   Viviana Simpler I, MD  metoprolol succinate (TOPROL-XL) 50 MG 24 hr tablet Take 0.5 tablets (25 mg total) by mouth daily. Take with or immediately following a meal. 12/03/19 03/02/20  Kate Sable, MD  mirtazapine (REMERON) 45 MG tablet TAKE 1 TABLET AT BEDTIME 02/14/20   Venia Carbon, MD  Multiple Vitamins-Minerals (ICAPS AREDS 2  PO) Take by mouth.    [provider]  senna-docusate (SENOKOT-S) 8.6-50 MG per tablet Take 2-4 tablets by mouth 2 (two) times daily.    [provider]    Allergies Patient has no known allergies.  Family History  Problem Relation Age of Onset  . Hypertension Father   . Stroke Brother   . Diabetes Brother   . Cancer Neg Hx        no breast or colon    Social History Social History   Tobacco Use  . Smoking status: Former Smoker    Quit date: 06/03/1978    Years since quitting: 42.1  . Smokeless tobacco: Never Used  Vaping Use  . Vaping Use: Never used  Substance Use Topics  . Alcohol use: Yes    Alcohol/week: 0.0 standard drinks    Comment: wine  . Drug use: No    Review of Systems Constitutional: No fever/chills Eyes: No visual changes.  No eye pain. ENT: No sore throat. Cardiovascular: Denies chest pain. Respiratory: Denies shortness of breath. Gastrointestinal: No abdominal pain.   Genitourinary: Negative for dysuria.  She has however noticed that she has been urinating more frequently may be 3 or 4 times a night for the last few days.  That is a little bit different.  Also urinating more frequently during the day but no burning and no odor Musculoskeletal: Negative for back pain. Skin: Negative for rash. Neurological: Negative for anything more than just a very mild headache... Which she reports actually is now resolved, areas of focal weakness or numbness.  Reports she just has a little bit of an off feeling hard to describe.    ____________________________________________   PHYSICAL EXAM:  VITAL SIGNS: ED Triage Vitals  Enc Vitals Group     BP 07/26/20 1654 (!) 160/82     Pulse Rate 07/26/20 1654 72     Resp 07/26/20 1654 18     Temp 07/26/20 1654 98.1 F (36.7 C)     Temp Source 07/26/20 1654 Oral     SpO2 07/26/20 1654 99 %     Weight 07/26/20 1657 127 lb (57.6 kg)     Height 07/26/20 1657 5\' 4"  (1.626 m)     Head Circumference  --      Peak Flow --      Pain Score 07/26/20 1657 3     Pain Loc --      Pain Edu? --      Excl. in [place]? --     Constitutional: Alert and oriented. Well appearing and in no acute distress.  She and her husband both very pleasant. Eyes: Conjunctivae are normal. Head: Atraumatic. Nose: No congestion/rhinnorhea. Mouth/Throat: Mucous membranes are moist. Neck: No stridor.  Cardiovascular: Normal rate, regular rhythm. Grossly normal heart sounds.  Good peripheral circulation. Respiratory: Normal respiratory effort.  No retractions. Lungs CTAB. Gastrointestinal: Soft and nontender. No distention. Musculoskeletal: No lower extremity tenderness nor edema. Neurologic:  Normal speech and language. No gross focal neurologic deficits are  appreciated.  No pronator drift in any extremity.  5-5 strength in all extremities.  Normal and clear speech.  Normal extraocular movements.  Very well oriented, normal conversation Skin:  Skin is warm, dry and intact. No rash noted. Psychiatric: Mood and affect are normal. Speech and behavior are normal.  ____________________________________________   LABS (all labs ordered are listed, but only abnormal results are displayed)  Labs Reviewed  BASIC METABOLIC PANEL - Abnormal; Notable for the following components:      Result Value   Sodium 134 (*)    BUN 26 (*)    Creatinine, Ser 1.09 (*)    GFR, Estimated 51 (*)    All other components within normal limits  CBC - Abnormal; Notable for the following components:   RBC 3.69 (*)    Hemoglobin 11.7 (*)    HCT 34.9 (*)    All other components within normal limits  URINALYSIS, COMPLETE (UACMP) WITH MICROSCOPIC - Abnormal; Notable for the following components:   Color, Urine STRAW (*)    APPearance CLEAR (*)    Ketones, ur 5 (*)    All other components within normal limits  URINE CULTURE  SARS CORONAVIRUS 2 (TAT 6-24 HRS)  TROPONIN I (HIGH SENSITIVITY)  TROPONIN I (HIGH SENSITIVITY)    ____________________________________________  EKG  ED ECG REPORT I, Delman Kitten, the attending physician, personally viewed and interpreted this ECG.  Date: 07/26/2020 EKG Time: 1700 Rate: 75 Rhythm: normal sinus rhythm QRS Axis: normal Intervals: normal ST/T Wave abnormalities: normal Narrative Interpretation: no evidence of acute ischemia  ____________________________________________  RADIOLOGY  CT Head Wo Contrast  Result Date: 07/26/2020 CLINICAL DATA:  Chronic headache EXAM: CT HEAD WITHOUT CONTRAST TECHNIQUE: Contiguous axial images were obtained from the base of the skull through the vertex without intravenous contrast. COMPARISON:  MRI 11/15/2019, CT brain 11/15/2019 FINDINGS: Brain: No acute territorial infarction, hemorrhage or intracranial mass. Mild atrophy. Mild white matter hypodensity consistent with chronic small vessel ischemic change. Stable ventricle size. Vascular: No hyperdense vessels.  Carotid vascular calcification Skull: Normal. Negative for fracture or focal lesion. Sinuses/Orbits: No acute finding. Other: None IMPRESSION: 1. No CT evidence for acute intracranial abnormality. 2. Atrophy and mild chronic small vessel ischemic changes of the white matter. Electronically Signed   By: Donavan Foil M.D.   On: 07/26/2020 18:22    CT head reviewed negative for acute findings. ____________________________________________   PROCEDURES  Procedure(s) performed: None  Procedures  Critical Care performed: No  ____________________________________________   INITIAL IMPRESSION / ASSESSMENT AND PLAN / ED COURSE  Pertinent labs & imaging results that were available during my care of the patient were reviewed by me and considered in my medical decision making (see chart for details).   She reports just a slight feeling of just feeling a little bit off may be a little lightheaded.  Her examination very reassuring.  EKG labs CT of the head and exam are very  reassuring nontoxic.  Will check a urinalysis as well as she does on review of systems endorses increased urinary frequency for a few days.  No associated abdominal pain.  Symptoms are very nonspecific, I see no signs or symptoms of acute intracranial etiology or central neurologic cause.  She has no cardiac or pulmonary symptoms.  Her vital signs very reassuring with just moderate hypertension noted in the ER  ----------------------------------------- 11:01 PM on 07/26/2020 -----------------------------------------  Patient continue to rest comfortably, no concerns or complaints at this time.  Reviewed work-up findings with  the patient all reassuring.  Return precautions and treatment recommendations and follow-up discussed with the patient who is agreeable with the plan.       ____________________________________________   FINAL CLINICAL IMPRESSION(S) / ED DIAGNOSES  Final diagnoses:  Lightheadedness        Note:  This document was prepared using Dragon voice recognition software and may include unintentional dictation errors       Delman Kitten, MD 07/26/20 2301

## 2020-07-26 NOTE — ED Notes (Signed)
Verbal order for CT head Dr Jari Pigg

## 2020-07-26 NOTE — ED Triage Notes (Signed)
Pt to ED POV sent from [facility] for HTN and headaches for few days, c/o "not feeling right". Denies cp/shob Pt in NAD, alert and oriented, RR even and unlabored

## 2020-07-27 LAB — SARS CORONAVIRUS 2 (TAT 6-24 HRS): SARS Coronavirus 2: NEGATIVE

## 2020-07-27 NOTE — ED Provider Notes (Signed)
Pt is an 85 year old female that presents with lightheadedness, blurred vision, "just not feeling right".  She had mild headache this morning.  Did yoga this morning and was feeling off when doing certain positions.  No current headache.  No chest pain, shortness of breath. Patient assessed in front lobby.  Vital signs were stable and heart rate normal with normal rhythm upon auscultation.  Lungs clear. Based on patient complaints and past medical history feel she would be better evaluated in the emergency room.  Patient stable to go with her husband to the ER.   Orvan July, NP 07/27/20 [phone]

## 2020-07-28 ENCOUNTER — Telehealth: Payer: Self-pay

## 2020-07-28 LAB — URINE CULTURE: Culture: NO GROWTH

## 2020-07-28 NOTE — Telephone Encounter (Signed)
Called pt to see how she was doing after recent ER Visit. She has an appt 08-01-20. Left message if she had any other concerns prior to the visit to let us know.

## 2020-08-01 ENCOUNTER — Ambulatory Visit (INDEPENDENT_AMBULATORY_CARE_PROVIDER_SITE_OTHER): Payer: Medicare Other | Admitting: Internal Medicine

## 2020-08-01 ENCOUNTER — Other Ambulatory Visit: Payer: Self-pay

## 2020-08-01 ENCOUNTER — Encounter: Payer: Self-pay | Admitting: Internal Medicine

## 2020-08-01 DIAGNOSIS — N3281 Overactive bladder: Secondary | ICD-10-CM | POA: Diagnosis not present

## 2020-08-01 DIAGNOSIS — R519 Headache, unspecified: Secondary | ICD-10-CM | POA: Insufficient documentation

## 2020-08-01 DIAGNOSIS — I6523 Occlusion and stenosis of bilateral carotid arteries: Secondary | ICD-10-CM

## 2020-08-01 DIAGNOSIS — N1831 Chronic kidney disease, stage 3a: Secondary | ICD-10-CM | POA: Diagnosis not present

## 2020-08-01 MED ORDER — MIRABEGRON ER 25 MG PO TB24: 25.0000 mg | ORAL_TABLET | Freq: Every day | ORAL | 1 refills | Status: DC

## 2020-08-01 NOTE — Assessment & Plan Note (Signed)
Stable vascular changes on head CT Is on statin and ASA

## 2020-08-01 NOTE — Assessment & Plan Note (Signed)
Major lifestyle issues with this Will try mirabegron 25 Consider increase to 50 She will check BP

## 2020-08-01 NOTE — Progress Notes (Signed)
Subjective:    Patient ID: Robin Logan, female    DOB: 1936-03-17, 85 y.o.   MRN: [id number]  HPI Here for ER follow up This visit occurred during the SARS-CoV-2 public health emergency.  Safety protocols were in place, including screening questions prior to the visit, additional usage of staff PPE, and extensive cleaning of exam room while observing appropriate contact time as indicated for disinfecting solutions.   Was having some unusual head pressure Lasted 4-5 days----just "felt funny" Husband made her get it checked out Went to urgent care---they were ready to close and referred her to the ER Tried urgent care first--then still sent to ER  Got CT of head--no acute findings Blood work--- GFR 51--but nothing else of note  Has had lots of stress Issues with husband's health Has to move for 10 year unit renovation (1 month) Lost close friend recently  Having overactive bladder Keeps her up at night---may be 5-6 times Frequency during the day as well Urinalysis and culture were negative in ER    Current Outpatient Medications on File Prior to Visit  Medication Sig Dispense Refill  . Ascorbic Acid (VITAMIN C) 1000 MG tablet Take 1,000 mg by mouth daily.    Marland Kitchen aspirin EC 81 MG tablet Take 81 mg by mouth daily.    Marland Kitchen atorvastatin (LIPITOR) 10 MG tablet Take 1 tablet (10 mg total) by mouth daily. 90 tablet 3  . cholecalciferol (VITAMIN D) 1000 UNITS tablet Take 1,000 Units by mouth daily.    Marland Kitchen ibuprofen (ADVIL,MOTRIN) 200 MG tablet Take 600 mg by mouth at bedtime as needed. For restless legs symptoms    . levothyroxine (SYNTHROID) 88 MCG tablet TAKE 1 TABLET DAILY 90 tablet 3  . mirtazapine (REMERON) 45 MG tablet TAKE 1 TABLET AT BEDTIME 90 tablet 3  . Multiple Vitamins-Minerals (ICAPS AREDS 2 PO) Take by mouth.    . senna-docusate (SENOKOT-S) 8.6-50 MG per tablet Take 2-4 tablets by mouth 2 (two) times daily.    . metoprolol succinate (TOPROL-XL) 50 MG 24 hr tablet Take 0.5  tablets (25 mg total) by mouth daily. Take with or immediately following a meal. 30 tablet 6   No current facility-administered medications on file prior to visit.    No Known Allergies  Past Medical History:  Diagnosis Date  . Arthritis   . Depression   . Hyperlipidemia   . Osteopenia   . Osteopenia   . Sleep disorder   . Thyroid disease     Past Surgical History:  Procedure Laterality Date  . ABDOMINAL HYSTERECTOMY    . APPENDECTOMY    . CATARACT EXTRACTION W/PHACO Right 03/03/2019   Procedure: CATARACT EXTRACTION PHACO AND INTRAOCULAR LENS PLACEMENT (IOC) RIGHT  00:44.0  16.1%  7.12;  Surgeon: Leandrew Koyanagi, MD;  Location: [place];  Service: Ophthalmology;  Laterality: Right;  . CATARACT EXTRACTION W/PHACO Left 03/24/2019   Procedure: CATARACT EXTRACTION PHACO AND INTRAOCULAR LENS PLACEMENT (IOC) LEFT  01:09.4  12.5%  8.73;  Surgeon: Leandrew Koyanagi, MD;  Location: [place];  Service: Ophthalmology;  Laterality: Left;  . TONSILLECTOMY AND ADENOIDECTOMY  1946    Family History  Problem Relation Age of Onset  . Hypertension Father   . Stroke Brother   . Diabetes Brother   . Cancer Neg Hx        no breast or colon    Social History   Socioeconomic History  . Marital status: Married    Spouse name: Not  on file  . Number of children: Not on file  . Years of education: Not on file  . Highest education level: Not on file  Occupational History  . Occupation: retired- Production manager of deeds [employer]  Tobacco Use  . Smoking status: Former Smoker    Quit date: 06/03/1978    Years since quitting: 42.1  . Smokeless tobacco: Never Used  Vaping Use  . Vaping Use: Never used  Substance and Sexual Activity  . Alcohol use: Yes    Alcohol/week: 0.0 standard drinks    Comment: wine  . Drug use: No  . Sexual activity: Not on file  Other Topics Concern  . Not on file  Social History Narrative   Requests DNR --order done 12/24/10.  Currently keeping it in draw, would accept resuscitation for now   Has living will   Husband is health care POA---alternate would be nieces (both sides)   No tube feedings if cognitively unaware   Social Determinants of Health   Financial Resource Strain: Not on file  Food Insecurity: Not on file  Transportation Needs: Not on file  Physical Activity: Not on file  Stress: Not on file  Social Connections: Not on file  Intimate Partner Violence: Not on file   Review of Systems Sleeps okay other than the nocturia Appetite is okay  Weight stable No chest pain or SOB    Objective:   Physical Exam Constitutional:      Appearance: Normal appearance.  Cardiovascular:     Rate and Rhythm: Normal rate and regular rhythm.     Heart sounds: No murmur heard. No gallop.   Pulmonary:     Effort: Pulmonary effort is normal.     Breath sounds: Normal breath sounds. No wheezing or rales.  Musculoskeletal:     Cervical back: Neck supple.  Lymphadenopathy:     Cervical: No cervical adenopathy.  Skin:    Comments: ~54mm moveable cyst in left occiput  Neurological:     General: No focal deficit present.     Mental Status: She is alert.  Psychiatric:        Mood and Affect: Mood normal.        Behavior: Behavior normal.            Assessment & Plan:

## 2020-08-01 NOTE — Patient Instructions (Signed)
Please try the new bladder medication. Have the nurse check your blood pressure about 1-2 weeks from now and let me know if it is elevated. If it helps, but not enough, we can consider increasing the dose in 1-2 months

## 2020-08-01 NOTE — Assessment & Plan Note (Signed)
Vague Extensive ER work up negative May be stress related and better now----observe only

## 2020-08-01 NOTE — Assessment & Plan Note (Signed)
Mildly reduced GFR on ER labs Will recheck at her yearly wellness visit

## 2020-08-16 MED ORDER — MIRABEGRON ER 25 MG PO TB24: 25.0000 mg | ORAL_TABLET | Freq: Every day | ORAL | 3 refills | Status: DC

## 2020-09-28 NOTE — Telephone Encounter (Signed)
[employer] called on behalf of insurance company and pt is aware to send in new Rx for Oxybutynin 5mg  to replace Mybetriq as it would cost a lot less.Marland KitchenMarland Kitchen

## 2020-10-03 NOTE — Telephone Encounter (Signed)
[employer]; a pharmacy advocate left v/m requesting to change myrbetriq 25 mg at a cost of $150.00 for 3 month supply to oxybutynin 5 mg with a cost of $15.00 for 3 mth supply. See message in this phone note as well as message on pt message dated 08/15/20 with Dr Everardo Beals previous answer.

## 2020-10-04 NOTE — Telephone Encounter (Signed)
Please call the pharmacy advocate back and let them know that oxybutynin is contraindicated in an 85 year old and I would appreciate it if they would stop calling. We have decide to stick with the myrbetriq

## 2020-10-19 DIAGNOSIS — Z23 Encounter for immunization: Secondary | ICD-10-CM | POA: Diagnosis not present

## 2020-11-09 DIAGNOSIS — H40003 Preglaucoma, unspecified, bilateral: Secondary | ICD-10-CM | POA: Diagnosis not present

## 2020-12-11 ENCOUNTER — Encounter: Payer: Self-pay | Admitting: Cardiology

## 2020-12-11 ENCOUNTER — Other Ambulatory Visit: Payer: Self-pay

## 2020-12-11 ENCOUNTER — Ambulatory Visit (INDEPENDENT_AMBULATORY_CARE_PROVIDER_SITE_OTHER): Payer: Medicare Other | Admitting: Cardiology

## 2020-12-11 VITALS — BP 110/68 | HR 72 | Ht 64.0 in | Wt 127.0 lb

## 2020-12-11 DIAGNOSIS — G459 Transient cerebral ischemic attack, unspecified: Secondary | ICD-10-CM

## 2020-12-11 DIAGNOSIS — I471 Supraventricular tachycardia: Secondary | ICD-10-CM | POA: Diagnosis not present

## 2020-12-11 NOTE — Patient Instructions (Signed)

## 2020-12-11 NOTE — Progress Notes (Signed)
Cardiology Office Note:    Date:  12/11/2020   ID:  Robin Logan, DOB 08/04/1935, MRN [id number]  PCP:  Venia Carbon, MD  [facility] Cardiologist:  Kate Sable, MD  [place] Electrophysiologist:  None   Referring MD: Venia Carbon, MD   Chief Complaint  Patient presents with   Other    6 month follow up. Meds reviewed verbally with patient.     History of Present Illness:    Robin Logan is a 85 y.o. female with a hx of hyperlipidemia, paroxysmal SVT, TIA who presents for follow-up.   Last seen due to history of SVT and TIA.  Cardiac monitor was placed to evaluate any incidence of atrial fibrillation or atrial flutter.  Has not had any palpitations or other episodes consistent with SVT since starting Toprol-XL.  Denies any strokelike symptoms.  Feels well, has no concerns at this time.   Prior notes Echocardiogram 0/2542, normal systolic function, EF 60 to 65%. Ultrasound 11/2019 bilateral arthrosclerosis plaque right greater than left.  No hemodynamically significant stenosis. Admitted to the hospital with TIA, telemetry monitoring showed SVT heart rates up to 149, Cardizem was administered with resolution.    Past Medical History:  Diagnosis Date   Arthritis    Depression    Hyperlipidemia    Osteopenia    Osteopenia    Sleep disorder    Thyroid disease     Past Surgical History:  Procedure Laterality Date   ABDOMINAL HYSTERECTOMY     APPENDECTOMY     CATARACT EXTRACTION W/PHACO Right 03/03/2019   Procedure: CATARACT EXTRACTION PHACO AND INTRAOCULAR LENS PLACEMENT (IOC) RIGHT  00:44.0  16.1%  7.12;  Surgeon: Leandrew Koyanagi, MD;  Location: [facility];  Service: Ophthalmology;  Laterality: Right;   CATARACT EXTRACTION W/PHACO Left 03/24/2019   Procedure: CATARACT EXTRACTION PHACO AND INTRAOCULAR LENS PLACEMENT (IOC) LEFT  01:09.4  12.5%  8.73;  Surgeon: Leandrew Koyanagi, MD;  Location: [facility];  Service:  Ophthalmology;  Laterality: Left;   TONSILLECTOMY AND ADENOIDECTOMY  1946    Current Medications: Current Meds  Medication Sig   Ascorbic Acid (VITAMIN C) 1000 MG tablet Take 1,000 mg by mouth daily.   aspirin EC 81 MG tablet Take 81 mg by mouth daily.   atorvastatin (LIPITOR) 10 MG tablet Take 1 tablet (10 mg total) by mouth daily.   cholecalciferol (VITAMIN D) 1000 UNITS tablet Take 1,000 Units by mouth daily.   ibuprofen (ADVIL,MOTRIN) 200 MG tablet Take 600 mg by mouth at bedtime as needed. For restless legs symptoms   levothyroxine (SYNTHROID) 88 MCG tablet TAKE 1 TABLET DAILY   metoprolol succinate (TOPROL-XL) 50 MG 24 hr tablet Take 0.5 tablets (25 mg total) by mouth daily. Take with or immediately following a meal.   mirabegron ER (MYRBETRIQ) 25 MG TB24 tablet Take 1 tablet (25 mg total) by mouth daily.   mirtazapine (REMERON) 45 MG tablet TAKE 1 TABLET AT BEDTIME   Multiple Vitamins-Minerals (ICAPS AREDS 2 PO) Take by mouth.   senna-docusate (SENOKOT-S) 8.6-50 MG per tablet Take 2-4 tablets by mouth 2 (two) times daily.     Allergies:   Patient has no known allergies.   Social History   Socioeconomic History   Marital status: Married    Spouse name: Not on file   Number of children: Not on file   Years of education: Not on file   Highest education level: Not on file  Occupational History  Occupation: retired- Production manager of [employer]  Tobacco Use   Smoking status: Former    Pack years: 0.00    Types: Cigarettes    Quit date: 06/03/1978    Years since quitting: 42.5   Smokeless tobacco: Never  Vaping Use   Vaping Use: Never used  Substance and Sexual Activity   Alcohol use: Yes    Alcohol/week: 0.0 standard drinks    Comment: wine   Drug use: No   Sexual activity: Not on file  Other Topics Concern   Not on file  Social History Narrative   Requests DNR --order done 12/24/10. Currently keeping it in draw, would accept resuscitation for now   Has  living will   Husband is health care POA---alternate would be nieces (both sides)   No tube feedings if cognitively unaware   Social Determinants of Health   Financial Resource Strain: Not on file  Food Insecurity: Not on file  Transportation Needs: Not on file  Physical Activity: Not on file  Stress: Not on file  Social Connections: Not on file     Family History: The patient's family history includes Diabetes in her brother; Hypertension in her father; Stroke in her brother. There is no history of Cancer.  ROS:   Please see the history of present illness.     All other systems reviewed and are negative.  EKGs/Labs/Other Studies Reviewed:    The following studies were reviewed today:   EKG:  EKG is  ordered today.  The ekg ordered today demonstrates normal sinus rhythm  Recent Labs: 07/26/2020: BUN 26; Creatinine, Ser 1.09; Hemoglobin 11.7; Platelets 209; Potassium 4.1; Sodium 134  Recent Lipid Panel    Component Value Date/Time   CHOL 185 11/15/2019 0329   TRIG 39 11/15/2019 0329   HDL 86 11/15/2019 0329   CHOLHDL 2.2 11/15/2019 0329   VLDL 8 11/15/2019 0329   LDLCALC 91 11/15/2019 0329   LDLDIRECT 161.5 11/30/2012 1553    Physical Exam:    VS:  BP 110/68 (BP Location: Left Arm, Patient Position: Sitting, Cuff Size: Normal)   Pulse 72   Ht 5\' 4"  (1.626 m)   Wt 127 lb (57.6 kg)   SpO2 97%   BMI 21.80 kg/m     Wt Readings from Last 3 Encounters:  12/11/20 127 lb (57.6 kg)  08/01/20 128 lb (58.1 kg)  07/26/20 127 lb (57.6 kg)     GEN:  Well nourished, well developed in no acute distress HEENT: Normal NECK: No JVD; No carotid bruits LYMPHATICS: No lymphadenopathy CARDIAC: RRR, no murmurs, rubs, gallops RESPIRATORY:  Clear to auscultation without rales, wheezing or rhonchi  ABDOMEN: Soft, non-tender, non-distended MUSCULOSKELETAL:  No edema; No deformity  SKIN: Warm and dry NEUROLOGIC:  Alert and oriented x 3 PSYCHIATRIC:  Normal affect   ASSESSMENT:     1. Paroxysmal SVT (supraventricular tachycardia) ([place])   2. TIA (transient ischemic attack)     PLAN:    In order of problems listed above:  paroxysmal SVT.  Currently in sinus rhythm.  Continue Toprol-XL 25 mg daily.  Last echocardiogram showed normal systolic function, EF 60 to 65% and no significant abnormalities. History of TIA, carotid ultrasound with bilateral carotid artery plaque,  No significant stenosis.  Continue aspirin 81 mg, Lipitor as prescribed.  2-week cardiac monitor did not show any evidence for A. fib or atrial flutter.  Follow-up yearly.   Medication Adjustments/Labs and Tests Ordered: Current medicines are reviewed at length  with the patient today.  Concerns regarding medicines are outlined above.  Orders Placed This Encounter  Procedures   EKG 12-Lead    No orders of the defined types were placed in this encounter.   Patient Instructions  Medication Instructions:   Your physician recommends that you continue on your current medications as directed. Please refer to the Current Medication list given to you today.  *If you need a refill on your cardiac medications before your next appointment, please call your pharmacy*   Lab Work:  None ordered If you have labs (blood work) drawn today and your tests are completely normal, you will receive your results only by: [place] (if you have MyChart) OR A paper copy in the mail If you have any lab test that is abnormal or we need to change your treatment, we will call you to review the results.   Testing/Procedures:  None ordered   Follow-Up: At [facility], you and your health needs are our priority.  As part of our continuing mission to provide you with exceptional heart care, we have created designated Provider Care Teams.  These Care Teams include your primary Cardiologist (physician) and Advanced Practice Providers (APPs -  Physician Assistants and Nurse Practitioners) who all work  together to provide you with the care you need, when you need it.  We recommend signing up for the patient portal called "MyChart".  Sign up information is provided on this After Visit Summary.  MyChart is used to connect with patients for Virtual Visits (Telemedicine).  Patients are able to view lab/test results, encounter notes, upcoming appointments, etc.  Non-urgent messages can be sent to your provider as well.   To learn more about what you can do with MyChart, go to [email or web address].    Your next appointment:   1 year(s)  The format for your next appointment:   In Person  Provider:   Kate Sable, MD   Other Instructions    Signed, Kate Sable, MD  12/11/2020 5:08 PM    [place]

## 2020-12-25 ENCOUNTER — Other Ambulatory Visit: Payer: Self-pay | Admitting: Internal Medicine

## 2021-01-04 ENCOUNTER — Other Ambulatory Visit: Payer: Self-pay

## 2021-01-04 MED ORDER — METOPROLOL SUCCINATE ER 50 MG PO TB24: 25.0000 mg | ORAL_TABLET | Freq: Every day | ORAL | 2 refills | Status: DC

## 2021-01-05 ENCOUNTER — Other Ambulatory Visit: Payer: Self-pay | Admitting: Internal Medicine

## 2021-01-23 ENCOUNTER — Encounter: Payer: Self-pay | Admitting: Internal Medicine

## 2021-01-23 ENCOUNTER — Ambulatory Visit (INDEPENDENT_AMBULATORY_CARE_PROVIDER_SITE_OTHER): Payer: Medicare Other | Admitting: Internal Medicine

## 2021-01-23 ENCOUNTER — Other Ambulatory Visit: Payer: Self-pay

## 2021-01-23 VITALS — BP 130/80 | HR 53 | Temp 97.2°F | Ht 63.0 in | Wt 127.0 lb

## 2021-01-23 DIAGNOSIS — I6523 Occlusion and stenosis of bilateral carotid arteries: Secondary | ICD-10-CM | POA: Diagnosis not present

## 2021-01-23 DIAGNOSIS — F39 Unspecified mood [affective] disorder: Secondary | ICD-10-CM | POA: Diagnosis not present

## 2021-01-23 DIAGNOSIS — E039 Hypothyroidism, unspecified: Secondary | ICD-10-CM | POA: Diagnosis not present

## 2021-01-23 DIAGNOSIS — I471 Supraventricular tachycardia: Secondary | ICD-10-CM

## 2021-01-23 DIAGNOSIS — I700 Atherosclerosis of aorta: Secondary | ICD-10-CM

## 2021-01-23 DIAGNOSIS — Z0000 Encounter for general adult medical examination without abnormal findings: Secondary | ICD-10-CM

## 2021-01-23 DIAGNOSIS — N1831 Chronic kidney disease, stage 3a: Secondary | ICD-10-CM | POA: Diagnosis not present

## 2021-01-23 LAB — HEPATIC FUNCTION PANEL
ALT: 23 U/L (ref 0–35)
AST: 26 U/L (ref 0–37)
Albumin: 4.3 g/dL (ref 3.5–5.2)
Alkaline Phosphatase: 74 U/L (ref 39–117)
Bilirubin, Direct: 0.1 mg/dL (ref 0.0–0.3)
Total Bilirubin: 0.5 mg/dL (ref 0.2–1.2)
Total Protein: 6.9 g/dL (ref 6.0–8.3)

## 2021-01-23 LAB — LIPID PANEL
Cholesterol: 186 mg/dL (ref 0–200)
HDL: 80.1 mg/dL (ref >39.00)
LDL Cholesterol: 88 mg/dL (ref 0–99)
NonHDL: 106.04
Total CHOL/HDL Ratio: 2
Triglycerides: 91 mg/dL (ref 0.0–149.0)
VLDL: 18.2 mg/dL (ref 0.0–40.0)

## 2021-01-23 LAB — RENAL FUNCTION PANEL
Albumin: 4.3 g/dL (ref 3.5–5.2)
BUN: 22 mg/dL (ref 6–23)
CO2: 28 mEq/L (ref 19–32)
Calcium: 9.4 mg/dL (ref 8.4–10.5)
Chloride: 96 mEq/L (ref 96–112)
Creatinine, Ser: 0.97 mg/dL (ref 0.40–1.20)
GFR: 54.19 mL/min — ABNORMAL LOW (ref >60.00)
Glucose, Bld: 82 mg/dL (ref 70–99)
Phosphorus: 4.2 mg/dL (ref 2.3–4.6)
Potassium: 4.9 mEq/L (ref 3.5–5.1)
Sodium: 132 mEq/L — ABNORMAL LOW (ref 135–145)

## 2021-01-23 LAB — CBC
HCT: 36.9 % (ref 36.0–46.0)
Hemoglobin: 12.2 g/dL (ref 12.0–15.0)
MCHC: 33.1 g/dL (ref 30.0–36.0)
MCV: 92.9 fl (ref 78.0–100.0)
Platelets: 201 10*3/uL (ref 150.0–400.0)
RBC: 3.97 Mil/uL (ref 3.87–5.11)
RDW: 13.1 % (ref 11.5–15.5)
WBC: 4.2 10*3/uL (ref 4.0–10.5)

## 2021-01-23 LAB — TSH: TSH: 0.99 u[IU]/mL (ref 0.35–5.50)

## 2021-01-23 LAB — T4, FREE: Free T4: 0.9 ng/dL (ref 0.60–1.60)

## 2021-01-23 MED ORDER — MIRTAZAPINE 30 MG PO TABS: 30.0000 mg | ORAL_TABLET | Freq: Every day | ORAL | 3 refills | Status: DC

## 2021-01-23 NOTE — Assessment & Plan Note (Signed)
I have personally reviewed the Medicare Annual Wellness questionnaire and have noted 1. The patient's medical and social history 2. Their use of alcohol, tobacco or illicit drugs 3. Their current medications and supplements 4. The patient's functional ability including ADL's, fall risks, home safety risks and hearing or visual             impairment. 5. Diet and physical activities 6. Evidence for depression or mood disorders  The patients weight, height, BMI and visual acuity have been recorded in the chart I have made referrals, counseling and provided education to the patient based review of the above and I have provided the pt with a written personalized care plan for preventive services.  I have provided you with a copy of your personalized plan for preventive services. Please take the time to review along with your updated medication list.  Bivalent COVID vaccine when available Flu vaccine soon Exercises regularly Td if any injury

## 2021-01-23 NOTE — Assessment & Plan Note (Signed)
Mild No action unless it worsens

## 2021-01-23 NOTE — Assessment & Plan Note (Signed)
Found on imaging----on statin, ASA

## 2021-01-23 NOTE — Progress Notes (Signed)
Subjective:    Patient ID: Robin Logan, female    DOB: 26-Jun-1935, 85 y.o.   MRN: [id number]  HPI Here for Medicare wellness visit and follow up of chronic health conditions This visit occurred during the SARS-CoV-2 public health emergency.  Safety protocols were in place, including screening questions prior to the visit, additional usage of staff PPE, and extensive cleaning of exam room while observing appropriate contact time as indicated for disinfecting solutions.   Reviewed form and advanced directives Reviewed other doctors Rare glass of wine No tobacco Vision is fine Mild hearing issues No falls Still exercises regularly Mild mood issues She does all the instrumental ADLs No memory problems  "I feel stressed"--trouble with husband's status Couldn't get the other knee done---PE with the other Now very limited---goes around campus on scooter Has 4 wheeled walker to go short distances Did have one bad spell when husband was down (he usually isn't) No longer can travel Not anhedonic---still go out to eat, etc Not sleeping that good  Mirabegron helps the OAB Still has some nocturia--but better  Last GFR 51  No heart problems No palpitations No chest pain or SOB No dizziness or syncope No edema  No neurologic symptoms to suggest TIA again On statin---known mild carotid disease and aortic atherosclerosis on imaging  Current Outpatient Medications on File Prior to Visit  Medication Sig Dispense Refill   Ascorbic Acid (VITAMIN C) 1000 MG tablet Take 1,000 mg by mouth daily.     aspirin EC 81 MG tablet Take 81 mg by mouth daily.     atorvastatin (LIPITOR) 10 MG tablet TAKE 1 TABLET DAILY 90 tablet 3   cholecalciferol (VITAMIN D) 1000 UNITS tablet Take 1,000 Units by mouth daily.     ibuprofen (ADVIL,MOTRIN) 200 MG tablet Take 600 mg by mouth at bedtime as needed. For restless legs symptoms     levothyroxine (SYNTHROID) 88 MCG tablet TAKE 1 TABLET DAILY 90 tablet  0   metoprolol succinate (TOPROL-XL) 50 MG 24 hr tablet Take 0.5 tablets (25 mg total) by mouth daily. Take with or immediately following a meal. 90 tablet 2   mirabegron ER (MYRBETRIQ) 25 MG TB24 tablet Take 1 tablet (25 mg total) by mouth daily. 90 tablet 3   mirtazapine (REMERON) 45 MG tablet TAKE 1 TABLET AT BEDTIME 90 tablet 3   Multiple Vitamins-Minerals (ICAPS AREDS 2 PO) Take by mouth.     senna-docusate (SENOKOT-S) 8.6-50 MG per tablet Take 2-4 tablets by mouth 2 (two) times daily.     No current facility-administered medications on file prior to visit.    No Known Allergies  Past Medical History:  Diagnosis Date   Arthritis    Depression    Hyperlipidemia    Osteopenia    Osteopenia    Sleep disorder    Thyroid disease     Past Surgical History:  Procedure Laterality Date   ABDOMINAL HYSTERECTOMY     APPENDECTOMY     CATARACT EXTRACTION W/PHACO Right 03/03/2019   Procedure: CATARACT EXTRACTION PHACO AND INTRAOCULAR LENS PLACEMENT (IOC) RIGHT  00:44.0  16.1%  7.12;  Surgeon: Leandrew Koyanagi, MD;  Location: [facility];  Service: Ophthalmology;  Laterality: Right;   CATARACT EXTRACTION W/PHACO Left 03/24/2019   Procedure: CATARACT EXTRACTION PHACO AND INTRAOCULAR LENS PLACEMENT (IOC) LEFT  01:09.4  12.5%  8.73;  Surgeon: Leandrew Koyanagi, MD;  Location: [facility];  Service: Ophthalmology;  Laterality: Left;   [place]  Family History  Problem Relation Age of Onset   Hypertension Father    Stroke Brother    Diabetes Brother    Cancer Neg Hx        no breast or colon    Social History   Socioeconomic History   Marital status: Married    Spouse name: Not on file   Number of children: Not on file   Years of education: Not on file   Highest education level: Not on file  Occupational History   Occupation: retired- Production manager of [employer]  Tobacco Use   Smoking status: Former    Types:  Cigarettes    Quit date: 06/03/1978    Years since quitting: 42.6   Smokeless tobacco: Never  Vaping Use   Vaping Use: Never used  Substance and Sexual Activity   Alcohol use: Yes    Alcohol/week: 0.0 standard drinks    Comment: wine   Drug use: No   Sexual activity: Not on file  Other Topics Concern   Not on file  Social History Narrative   Requests DNR --order done 12/24/10. Currently keeping it in draw, would accept resuscitation for now   Has living will   Husband is health care POA---alternate would be nieces (both sides)   No tube feedings if cognitively unaware   Social Determinants of Health   Financial Resource Strain: Not on file  Food Insecurity: Not on file  Transportation Needs: Not on file  Physical Activity: Not on file  Stress: Not on file  Social Connections: Not on file  Intimate Partner Violence: Not on file   Review of Systems Appetite is fine Weight stable Ongoing sleep issues Wears seat belt Teeth okay--keeps up with dentist No suspicious skin lesions now No heartburn or dysphagia Bowels moving okay with senokot. No blood No sig back or joint pain     Objective:   Physical Exam Constitutional:      Appearance: Normal appearance.  HENT:     Mouth/Throat:     Comments: No lesions Eyes:     Conjunctiva/sclera: Conjunctivae normal.     Pupils: Pupils are equal, round, and reactive to light.  Cardiovascular:     Rate and Rhythm: Normal rate and regular rhythm.     Pulses: Normal pulses.     Heart sounds: No murmur heard.   No gallop.  Pulmonary:     Effort: Pulmonary effort is normal.     Breath sounds: Normal breath sounds. No wheezing or rales.  Abdominal:     Palpations: Abdomen is soft.     Tenderness: There is no abdominal tenderness.  Musculoskeletal:     Cervical back: Neck supple.     Right lower leg: No edema.     Left lower leg: No edema.  Lymphadenopathy:     Cervical: No cervical adenopathy.  Skin:    General: Skin is  warm.     Findings: No rash.  Neurological:     Mental Status: She is alert and oriented to person, place, and time.     Comments: President---"Joe Biden, Trump, Obama" [id number] D-l-r-o-w Recall 3/3  Psychiatric:        Behavior: Behavior normal.     Comments: Close to tears discussing stress---but very brief           Assessment & Plan:

## 2021-01-23 NOTE — Assessment & Plan Note (Signed)
No symptoms on the metoprolol 

## 2021-01-23 NOTE — Assessment & Plan Note (Signed)
Will recheck labs on levothyroxine 

## 2021-01-23 NOTE — Progress Notes (Signed)
Hearing Screening   '500Hz'$  '1000Hz'$  '2000Hz'$  '4000Hz'$   Right ear '25 25 20 '$ 0  Left ear '20 20 20 20  '$ Vision Screening - Comments:: June 2022

## 2021-01-23 NOTE — Assessment & Plan Note (Signed)
Mild bilateral blockages found after TIA On ASA, statin

## 2021-01-23 NOTE — Assessment & Plan Note (Signed)
Some dysthymia and sleep issues Will try decreasing the mirtazapine to see if it helps sleep more

## 2021-02-08 MED ORDER — MIRTAZAPINE 45 MG PO TABS: 45.0000 mg | ORAL_TABLET | Freq: Every day | ORAL | 3 refills | Status: DC

## 2021-02-15 DIAGNOSIS — M48062 Spinal stenosis, lumbar region with neurogenic claudication: Secondary | ICD-10-CM | POA: Diagnosis not present

## 2021-02-15 DIAGNOSIS — M5416 Radiculopathy, lumbar region: Secondary | ICD-10-CM | POA: Diagnosis not present

## 2021-02-15 DIAGNOSIS — M5136 Other intervertebral disc degeneration, lumbar region: Secondary | ICD-10-CM | POA: Diagnosis not present

## 2021-02-22 DIAGNOSIS — Z23 Encounter for immunization: Secondary | ICD-10-CM | POA: Diagnosis not present

## 2021-03-05 DIAGNOSIS — H903 Sensorineural hearing loss, bilateral: Secondary | ICD-10-CM | POA: Diagnosis not present

## 2021-03-09 DIAGNOSIS — D1801 Hemangioma of skin and subcutaneous tissue: Secondary | ICD-10-CM | POA: Diagnosis not present

## 2021-03-09 DIAGNOSIS — L821 Other seborrheic keratosis: Secondary | ICD-10-CM | POA: Diagnosis not present

## 2021-03-09 DIAGNOSIS — L814 Other melanin hyperpigmentation: Secondary | ICD-10-CM | POA: Diagnosis not present

## 2021-03-15 DIAGNOSIS — Z23 Encounter for immunization: Secondary | ICD-10-CM | POA: Diagnosis not present

## 2021-03-26 ENCOUNTER — Other Ambulatory Visit: Payer: Self-pay | Admitting: Internal Medicine

## 2021-04-11 IMAGING — CT CT HEAD W/O CM
3 series · 15 of 47 positions shown, 18 images · non-contrast
Comparison: MRI 11/15/2019, CT brain 11/15/2019

CLINICAL DATA: Chronic headache

EXAM:
CT HEAD WITHOUT CONTRAST
TECHNIQUE: Contiguous axial images were obtained from the base of the skull
through the vertex without intravenous contrast.

[Series 3: head wo · axial · 0.43mm/px · z∈[-150,-25]mm · 9 of 31 slices shown, 12 images]
[im 3/31  brain]
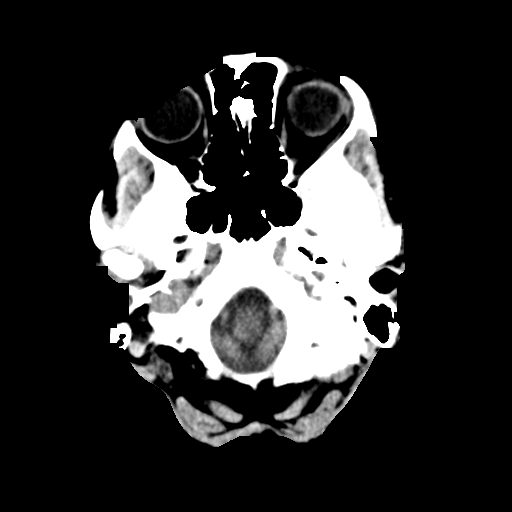
[im 3/31  bone]
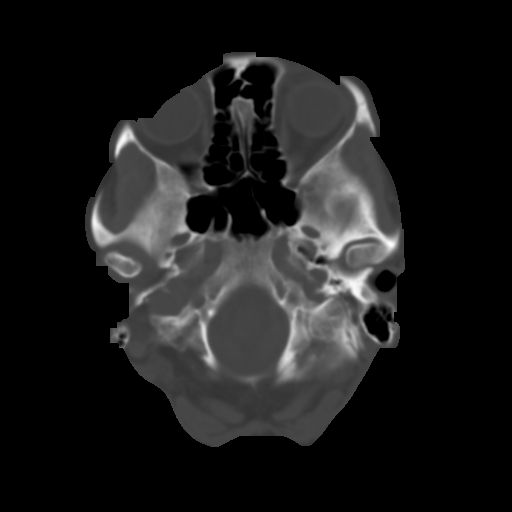
[im 6/31  brain]
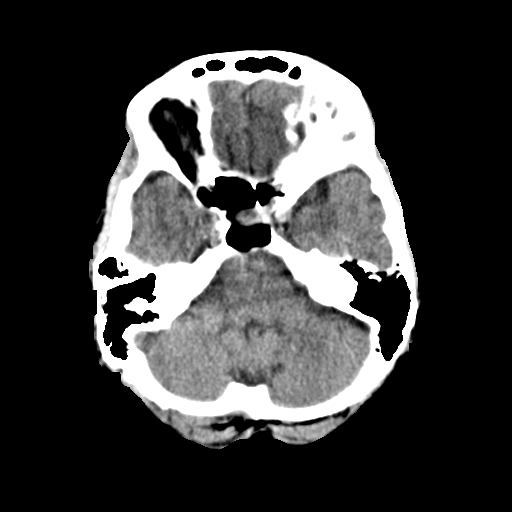
[im 9/31  brain]
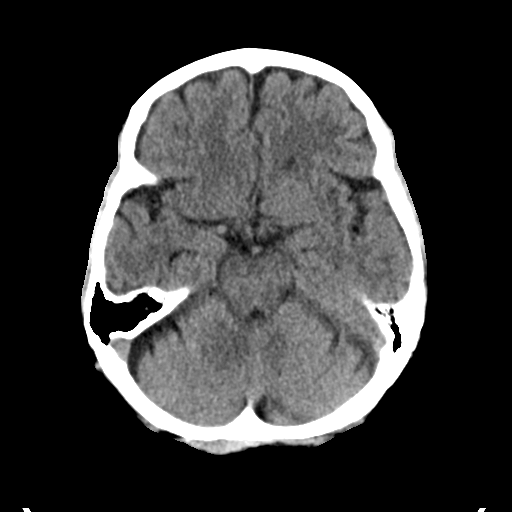
[im 12/31  brain]
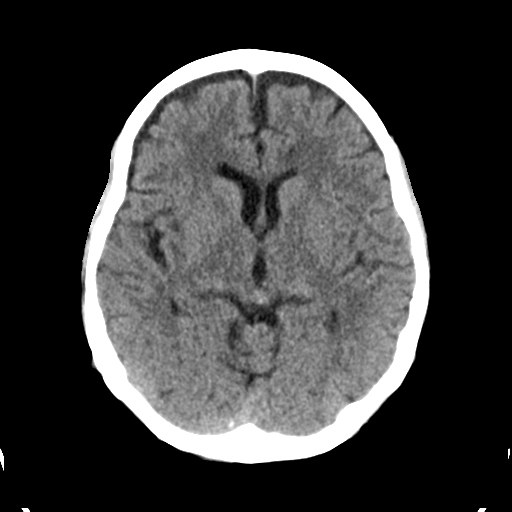
[im 16/31  brain]
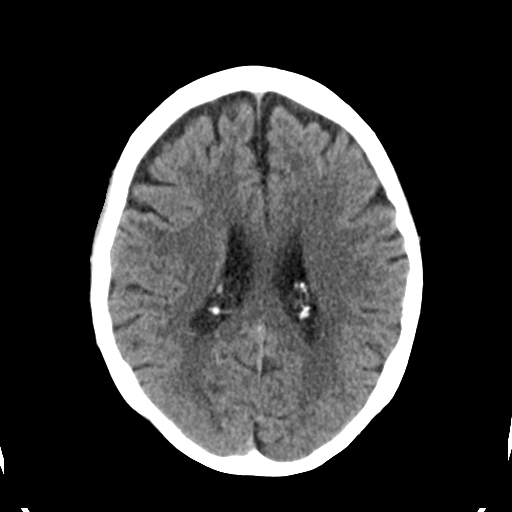
[im 16/31  bone]
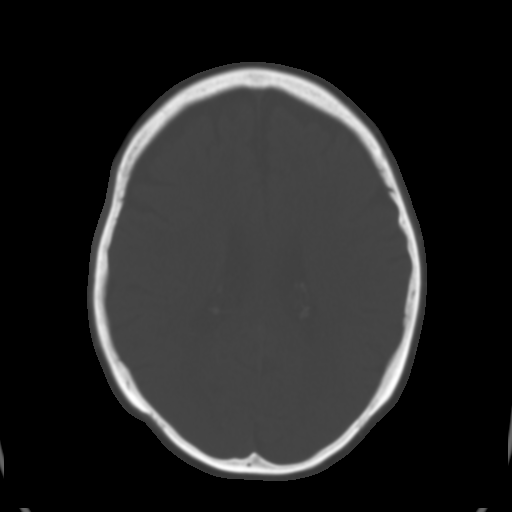
[im 19/31  brain]
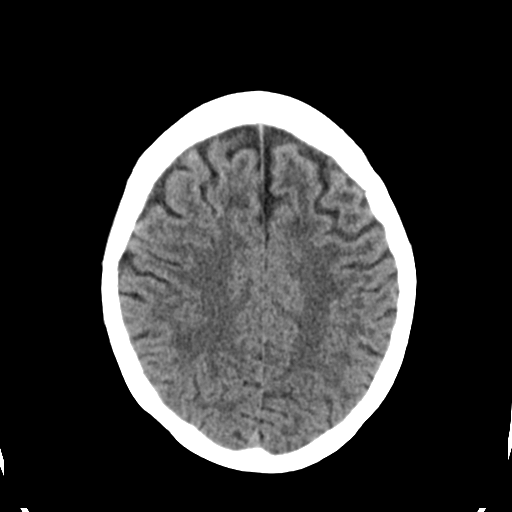
[im 22/31  brain]
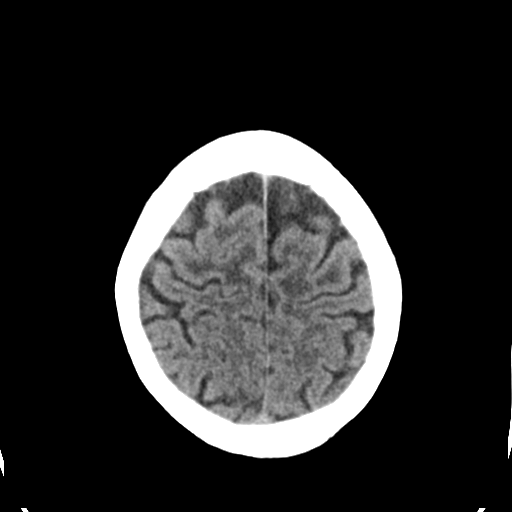
[im 25/31  brain]
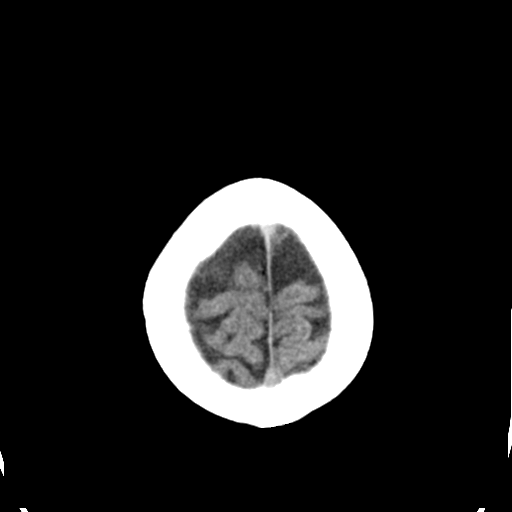
[im 28/31  brain]
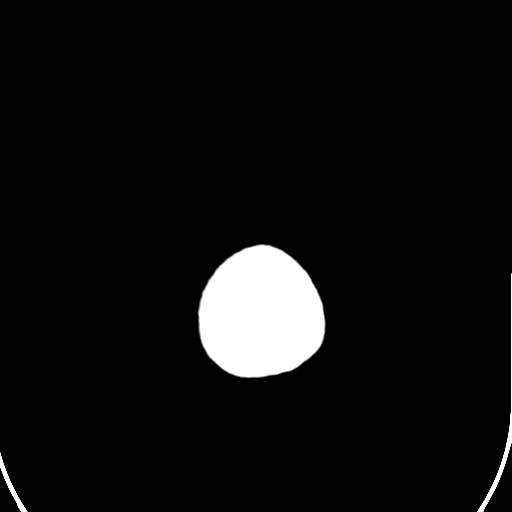
[im 28/31  bone]
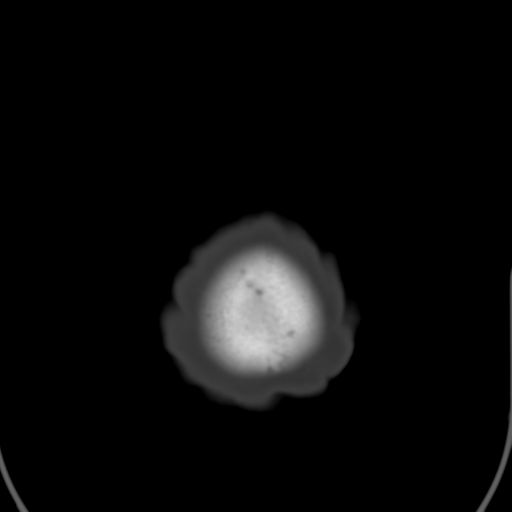

[Series 4: coronal soft tissue · coronal · 0.31mm/px · 3 of 61 slices shown]
[im 21/61  brain]
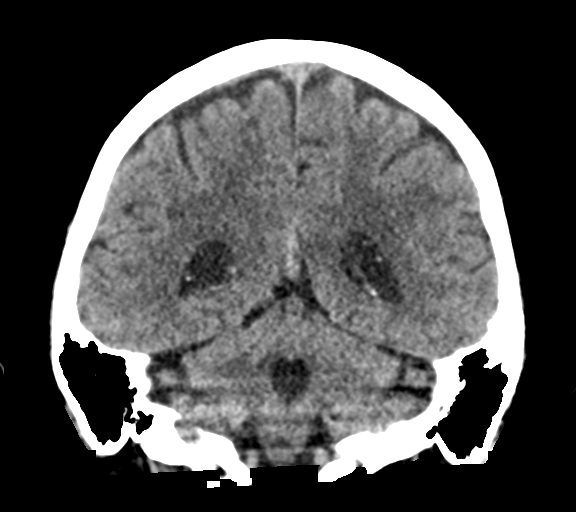
[im 27/61  brain]
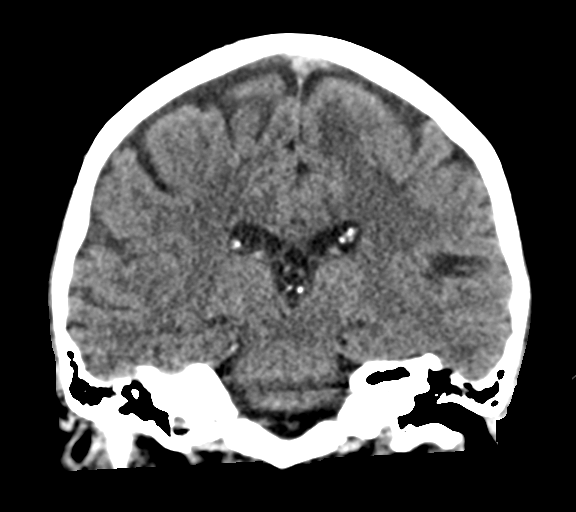
[im 34/61  brain]
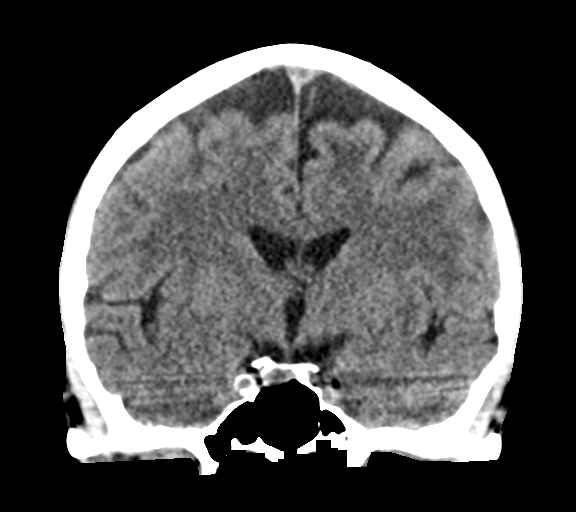

[Series 5: sagittal soft tissue · sagittal · 0.31mm/px · 3 of 54 slices shown]
[im 18/54  brain]
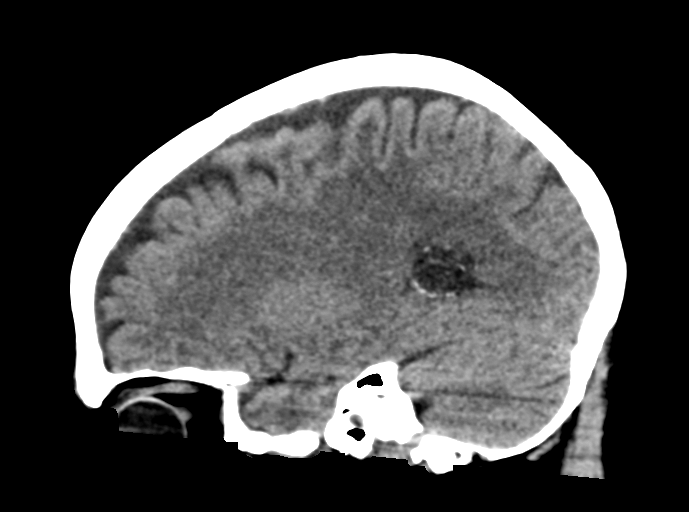
[im 27/54  brain]
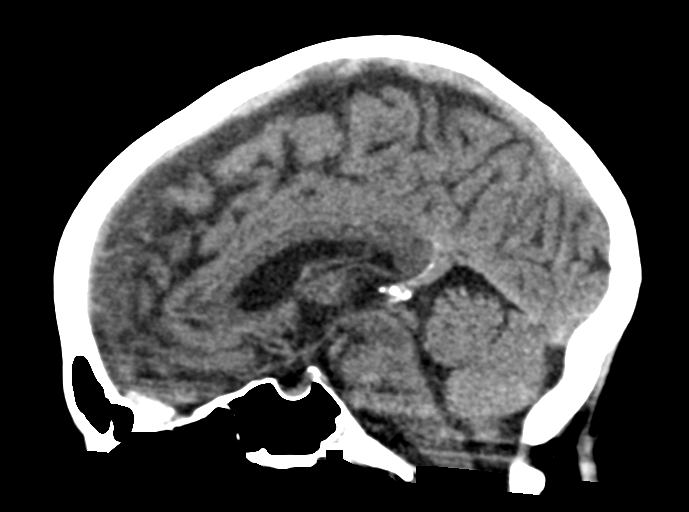
[im 36/54  brain]
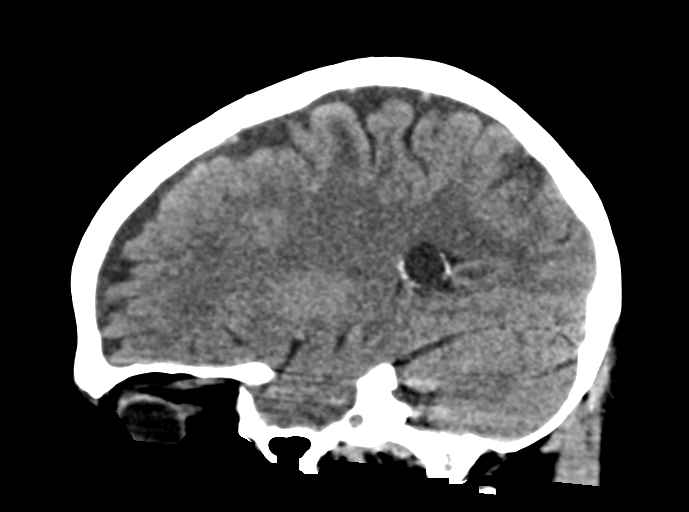

[15 of 47 positions shown; findings below may reference images not displayed]

FINDINGS: Brain: No acute territorial infarction, hemorrhage or intracranial
mass. Mild atrophy. Mild white matter hypodensity consistent with
chronic small vessel ischemic change. Stable ventricle size.

Vascular: No hyperdense vessels.  Carotid vascular calcification

Skull: Normal. Negative for fracture or focal lesion.

Sinuses/Orbits: No acute finding.

Other: None
IMPRESSION: 1. No CT evidence for acute intracranial abnormality.
2. Atrophy and mild chronic small vessel ischemic changes of the
white matter.

## 2021-05-29 ENCOUNTER — Encounter: Payer: Self-pay | Admitting: Internal Medicine

## 2021-05-29 MED ORDER — MIRABEGRON ER 50 MG PO TB24: 50.0000 mg | ORAL_TABLET | Freq: Every day | ORAL | 3 refills | Status: DC

## 2021-07-09 DIAGNOSIS — M2042 Other hammer toe(s) (acquired), left foot: Secondary | ICD-10-CM | POA: Diagnosis not present

## 2021-07-09 DIAGNOSIS — M2041 Other hammer toe(s) (acquired), right foot: Secondary | ICD-10-CM | POA: Diagnosis not present

## 2021-07-27 ENCOUNTER — Emergency Department: Payer: Medicare Other

## 2021-07-27 ENCOUNTER — Observation Stay
Admission: EM | Admit: 2021-07-27 | Discharge: 2021-07-28 | Disposition: A | Discharge status: Home / Self Care | Payer: Medicare Other | Attending: Internal Medicine | Admitting: Internal Medicine

## 2021-07-27 ENCOUNTER — Observation Stay: Payer: Medicare Other

## 2021-07-27 ENCOUNTER — Telehealth: Payer: Self-pay

## 2021-07-27 ENCOUNTER — Other Ambulatory Visit: Payer: Self-pay

## 2021-07-27 DIAGNOSIS — J449 Chronic obstructive pulmonary disease, unspecified: Secondary | ICD-10-CM | POA: Insufficient documentation

## 2021-07-27 DIAGNOSIS — R9431 Abnormal electrocardiogram [ECG] [EKG]: Secondary | ICD-10-CM | POA: Diagnosis not present

## 2021-07-27 DIAGNOSIS — E871 Hypo-osmolality and hyponatremia: Secondary | ICD-10-CM | POA: Diagnosis not present

## 2021-07-27 DIAGNOSIS — Z79899 Other long term (current) drug therapy: Secondary | ICD-10-CM | POA: Diagnosis not present

## 2021-07-27 DIAGNOSIS — I69314 Frontal lobe and executive function deficit following cerebral infarction: Secondary | ICD-10-CM | POA: Diagnosis not present

## 2021-07-27 DIAGNOSIS — Z20822 Contact with and (suspected) exposure to covid-19: Secondary | ICD-10-CM | POA: Diagnosis not present

## 2021-07-27 DIAGNOSIS — Z8673 Personal history of transient ischemic attack (TIA), and cerebral infarction without residual deficits: Secondary | ICD-10-CM | POA: Diagnosis present

## 2021-07-27 DIAGNOSIS — Z87891 Personal history of nicotine dependence: Secondary | ICD-10-CM | POA: Diagnosis not present

## 2021-07-27 DIAGNOSIS — I639 Cerebral infarction, unspecified: Secondary | ICD-10-CM | POA: Diagnosis not present

## 2021-07-27 DIAGNOSIS — Z7982 Long term (current) use of aspirin: Secondary | ICD-10-CM | POA: Insufficient documentation

## 2021-07-27 DIAGNOSIS — R059 Cough, unspecified: Secondary | ICD-10-CM | POA: Diagnosis not present

## 2021-07-27 DIAGNOSIS — G459 Transient cerebral ischemic attack, unspecified: Secondary | ICD-10-CM | POA: Diagnosis not present

## 2021-07-27 DIAGNOSIS — E039 Hypothyroidism, unspecified: Secondary | ICD-10-CM | POA: Diagnosis not present

## 2021-07-27 DIAGNOSIS — R200 Anesthesia of skin: Secondary | ICD-10-CM | POA: Diagnosis present

## 2021-07-27 LAB — TSH: TSH: 6.347 u[IU]/mL — ABNORMAL HIGH (ref 0.350–4.500)

## 2021-07-27 LAB — COMPREHENSIVE METABOLIC PANEL
ALT: 30 U/L (ref 0–44)
AST: 38 U/L (ref 15–41)
Albumin: 4.1 g/dL (ref 3.5–5.0)
Alkaline Phosphatase: 75 U/L (ref 38–126)
Anion gap: 12 (ref 5–15)
BUN: 18 mg/dL (ref 8–23)
CO2: 23 mmol/L (ref 22–32)
Calcium: 9.1 mg/dL (ref 8.9–10.3)
Chloride: 92 mmol/L — ABNORMAL LOW (ref 98–111)
Creatinine, Ser: 0.82 mg/dL (ref 0.44–1.00)
GFR, Estimated: >60 mL/min (ref >60)
Glucose, Bld: 106 mg/dL — ABNORMAL HIGH (ref 70–99)
Potassium: 4.6 mmol/L (ref 3.5–5.1)
Sodium: 127 mmol/L — ABNORMAL LOW (ref 135–145)
Total Bilirubin: 0.6 mg/dL (ref 0.3–1.2)
Total Protein: 7.1 g/dL (ref 6.5–8.1)

## 2021-07-27 LAB — DIFFERENTIAL
Abs Immature Granulocytes: 0.05 10*3/uL (ref 0.00–0.07)
Basophils Absolute: 0.1 10*3/uL (ref 0.0–0.1)
Basophils Relative: 1 %
Eosinophils Absolute: 0.3 10*3/uL (ref 0.0–0.5)
Eosinophils Relative: 3 %
Immature Granulocytes: 1 %
Lymphocytes Relative: 12 %
Lymphs Abs: 1.1 10*3/uL (ref 0.7–4.0)
Monocytes Absolute: 0.9 10*3/uL (ref 0.1–1.0)
Monocytes Relative: 10 %
Neutro Abs: 6.8 10*3/uL (ref 1.7–7.7)
Neutrophils Relative %: 73 %

## 2021-07-27 LAB — CBC
HCT: 37.4 % (ref 36.0–46.0)
Hemoglobin: 12.2 g/dL (ref 12.0–15.0)
MCH: 30.8 pg (ref 26.0–34.0)
MCHC: 32.6 g/dL (ref 30.0–36.0)
MCV: 94.4 fL (ref 80.0–100.0)
Platelets: 268 10*3/uL (ref 150–400)
RBC: 3.96 MIL/uL (ref 3.87–5.11)
RDW: 12.1 % (ref 11.5–15.5)
WBC: 9.3 10*3/uL (ref 4.0–10.5)
nRBC: 0 % (ref 0.0–0.2)

## 2021-07-27 LAB — BASIC METABOLIC PANEL
Anion gap: 9 (ref 5–15)
BUN: 17 mg/dL (ref 8–23)
CO2: 23 mmol/L (ref 22–32)
Calcium: 8.9 mg/dL (ref 8.9–10.3)
Chloride: 96 mmol/L — ABNORMAL LOW (ref 98–111)
Creatinine, Ser: 0.7 mg/dL (ref 0.44–1.00)
GFR, Estimated: >60 mL/min (ref >60)
Glucose, Bld: 133 mg/dL — ABNORMAL HIGH (ref 70–99)
Potassium: 4.3 mmol/L (ref 3.5–5.1)
Sodium: 128 mmol/L — ABNORMAL LOW (ref 135–145)

## 2021-07-27 LAB — URINALYSIS, COMPLETE (UACMP) WITH MICROSCOPIC
Bacteria, UA: NONE SEEN
Bilirubin Urine: NEGATIVE
Glucose, UA: NEGATIVE mg/dL
Hgb urine dipstick: NEGATIVE
Ketones, ur: 5 mg/dL — AB
Leukocytes,Ua: NEGATIVE
Nitrite: NEGATIVE
Protein, ur: NEGATIVE mg/dL
Specific Gravity, Urine: 1.004 — ABNORMAL LOW (ref 1.005–1.030)
pH: 7 (ref 5.0–8.0)

## 2021-07-27 LAB — PROTIME-INR
INR: 0.9 (ref 0.8–1.2)
Prothrombin Time: 12 seconds (ref 11.4–15.2)

## 2021-07-27 LAB — RESP PANEL BY RT-PCR (FLU A&B, COVID) ARPGX2
Influenza A by PCR: NEGATIVE
Influenza B by PCR: NEGATIVE
SARS Coronavirus 2 by RT PCR: NEGATIVE

## 2021-07-27 LAB — HEMOGLOBIN A1C
Hgb A1c MFr Bld: 5.9 % — ABNORMAL HIGH (ref 4.8–5.6)
Mean Plasma Glucose: 123 mg/dL

## 2021-07-27 LAB — LIPID PANEL
Cholesterol: 188 mg/dL (ref 0–200)
HDL: 87 mg/dL (ref >40)
LDL Cholesterol: 78 mg/dL (ref 0–99)
Total CHOL/HDL Ratio: 2.2 RATIO
Triglycerides: 113 mg/dL (ref <150)
VLDL: 23 mg/dL (ref 0–40)

## 2021-07-27 LAB — APTT: aPTT: 34 seconds (ref 24–36)

## 2021-07-27 LAB — TROPONIN I (HIGH SENSITIVITY): Troponin I (High Sensitivity): 6 ng/L (ref <18)

## 2021-07-27 LAB — CBG MONITORING, ED: Glucose-Capillary: 87 mg/dL (ref 70–99)

## 2021-07-27 MED ORDER — SODIUM CHLORIDE 0.9% FLUSH
3.0000 mL | Freq: Once | INTRAVENOUS | Status: AC
  Administered 2021-07-27: 3 mL via INTRAVENOUS

## 2021-07-27 MED ORDER — ASPIRIN EC 81 MG PO TBEC
81.0000 mg | DELAYED_RELEASE_TABLET | Freq: Every day | ORAL | Status: DC
  Administered 2021-07-27 – 2021-07-28 (×2): 81 mg via ORAL
  Filled 2021-07-27 (×2): qty 1

## 2021-07-27 MED ORDER — GADOBUTROL 1 MMOL/ML IV SOLN
5.0000 mL | Freq: Once | INTRAVENOUS | Status: AC | PRN
  Administered 2021-07-27: 5 mL via INTRAVENOUS
  Filled 2021-07-27: qty 6

## 2021-07-27 MED ORDER — STROKE: EARLY STAGES OF RECOVERY BOOK: Freq: Once | Status: AC

## 2021-07-27 MED ORDER — LEVOTHYROXINE SODIUM 88 MCG PO TABS
88.0000 ug | ORAL_TABLET | Freq: Every day | ORAL | Status: DC
  Administered 2021-07-28: 05:00:00 88 ug via ORAL
  Filled 2021-07-27: qty 1

## 2021-07-27 MED ORDER — ACETAMINOPHEN 650 MG RE SUPP: 650.0000 mg | RECTAL | Status: DC | PRN

## 2021-07-27 MED ORDER — ATORVASTATIN CALCIUM 20 MG PO TABS
10.0000 mg | ORAL_TABLET | Freq: Every day | ORAL | Status: DC
  Administered 2021-07-27 – 2021-07-28 (×2): 10 mg via ORAL
  Filled 2021-07-27 (×2): qty 1

## 2021-07-27 MED ORDER — CLOPIDOGREL BISULFATE 75 MG PO TABS
75.0000 mg | ORAL_TABLET | Freq: Every day | ORAL | Status: DC
  Administered 2021-07-28: 75 mg via ORAL
  Filled 2021-07-27: qty 1

## 2021-07-27 MED ORDER — ASPIRIN 81 MG PO CHEW
243.0000 mg | CHEWABLE_TABLET | Freq: Once | ORAL | Status: AC
Start: 2021-07-27 — End: 2021-07-27
  Administered 2021-07-27: 243 mg via ORAL
  Filled 2021-07-27: qty 3

## 2021-07-27 MED ORDER — ACETAMINOPHEN 160 MG/5ML PO SOLN
650.0000 mg | ORAL | Status: DC | PRN
  Filled 2021-07-27: qty 20.3

## 2021-07-27 MED ORDER — CLOPIDOGREL BISULFATE 75 MG PO TABS
300.0000 mg | ORAL_TABLET | Freq: Once | ORAL | Status: AC
  Administered 2021-07-27: 300 mg via ORAL
  Filled 2021-07-27: qty 4

## 2021-07-27 MED ORDER — ENOXAPARIN SODIUM 40 MG/0.4ML IJ SOSY
40.0000 mg | PREFILLED_SYRINGE | INTRAMUSCULAR | Status: DC
  Filled 2021-07-27: qty 0.4

## 2021-07-27 MED ORDER — ACETAMINOPHEN 325 MG PO TABS: 650.0000 mg | ORAL_TABLET | ORAL | Status: DC | PRN

## 2021-07-27 NOTE — ED Notes (Signed)
Pt in MRI, MRI is complete, bed is ready, pt will be taken now to room 105.

## 2021-07-27 NOTE — Telephone Encounter (Signed)
Per chart review tab pt arrived at [facility] ED today at 11:51 and had CT of head at 1:25PM. Sending note to Dr Silvio Pate and Larene Beach CMA.

## 2021-07-27 NOTE — ED Notes (Signed)
Patient taken to MRI

## 2021-07-27 NOTE — ED Provider Notes (Signed)
[facility] Provider Note    Event Date/Time   First MD Initiated Contact with Patient 07/27/21 1323     (approximate)   History   Transient Ischemic Attack   HPI  Robin Logan is a 86 y.o. female with past medical history of arthritis, HDL, thyroid disorder, hypothyroidism, paroxysmal SVT, and previous TIA who presents for evaluation of some left arm numbness.  Patient states today was the second episode and ibuprofen occurred on 2/22 while she was cooking.  She states this lasted a few minutes and went away on its own and she did not think anything of it.  She states it happened again today around 1030 or 11 AM but was more concerned and advised by staff at her assisted living facility to be evaluated.  She states she is now back to normal.  She has no associated weakness or any other areas of numbness.  No recent falls or injuries.  She denies any headache, earache, sore throat, vision changes, vertigo, chest pain, cough, shortness of breath, urinary symptoms or any other acute concerns.  She took 81 mg of ASA this morning.      Physical Exam  Triage Vital Signs: ED Triage Vitals  Enc Vitals Group     BP 07/27/21 1159 (!) 158/75     Pulse Rate 07/27/21 1159 71     Resp 07/27/21 1159 18     Temp 07/27/21 1159 98.4 F (36.9 C)     Temp Source 07/27/21 1159 Oral     SpO2 07/27/21 1159 98 %     Weight 07/27/21 1156 130 lb (59 kg)     Height 07/27/21 1156 5\' 4"  (1.626 m)     Head Circumference --      Peak Flow --      Pain Score 07/27/21 1156 0     Pain Loc --      Pain Edu? --      Excl. in [place]? --     Most recent vital signs: Vitals:   07/27/21 1159  BP: (!) 158/75  Pulse: 71  Resp: 18  Temp: 98.4 F (36.9 C)  SpO2: 98%    General: Awake, no distress.  CV:  Good peripheral perfusion.  Resp:  Normal effort.  Abd:  No distention.  Other:  Cranial nerves II through XII grossly intact.  No pronator drift.  No finger dysmetria.   Symmetric 5/5 strength of all extremities.  Sensation intact to light touch in all extremities.     ED Results / Procedures / Treatments  Labs (all labs ordered are listed, but only abnormal results are displayed) Labs Reviewed  COMPREHENSIVE METABOLIC PANEL - Abnormal; Notable for the following components:      Result Value   Sodium 127 (*)    Chloride 92 (*)    Glucose, Bld 106 (*)    All other components within normal limits  URINALYSIS, COMPLETE (UACMP) WITH MICROSCOPIC - Abnormal; Notable for the following components:   Color, Urine COLORLESS (*)    APPearance CLEAR (*)    Specific Gravity, Urine 1.004 (*)    Ketones, ur 5 (*)    All other components within normal limits  TSH - Abnormal; Notable for the following components:   TSH 6.347 (*)    All other components within normal limits  RESP PANEL BY RT-PCR (FLU A&B, COVID) ARPGX2  PROTIME-INR  APTT  CBC  DIFFERENTIAL  LIPID PANEL  HEMOGLOBIN A1C  CBG MONITORING,  ED  TROPONIN I (HIGH SENSITIVITY)     EKG  EKG is normal sinus rhythm with a ventricular rate of 66, incomplete right bundle branch block, otherwise unremarkable intervals with some artifact in V1 without other evidence of acute ischemia or significant arrhythmia.   RADIOLOGY On my interpretation of CT head I do not see any large areas of ischemia, mass effect edema ventriculomegaly or hemorrhage.  I also reviewed radiology interpretation and agree with their findings of no acute process.   PROCEDURES:  Critical Care performed: No  Procedures    MEDICATIONS ORDERED IN ED: Medications  sodium chloride flush (NS) 0.9 % injection 3 mL (3 mLs Intravenous Given 07/27/21 1422)  aspirin chewable tablet 243 mg (243 mg Oral Given 07/27/21 1418)  gadobutrol (GADAVIST) 1 MMOL/ML injection 5 mL (5 mLs Intravenous Contrast Given 07/27/21 1559)     IMPRESSION / MDM / ASSESSMENT AND PLAN / ED COURSE  I reviewed the triage vital signs and the nursing notes.                               Differential diagnosis includes, but is not limited to TIA, CVA, metabolic arrangement, atypical migraine, focal simple seizure, peripheral neuropathy and metabolic arrangements.  EKG is normal sinus rhythm with a ventricular rate of 66, incomplete right bundle branch block, otherwise unremarkable intervals with some artifact in V1 without other evidence of acute ischemia or significant arrhythmia.  Troponin unremarkable and not suggestive of atypical ACS.  On my interpretation of CT head I do not see any large areas of ischemia, mass effect edema ventriculomegaly or hemorrhage.  I also reviewed radiology interpretation and agree with their findings of no acute process.  Coags are unremarkable.  CMP is remarkable for sodium of 127 without any other significant electrolyte or metabolic arrangements.  Troponin is nonelevated and overall picture is not suggestive of atypical cardiac ischemia.  CBC without leukocytosis or acute anemia.  Concern for TIA discussed with on-call neurologist Dr. Leonel Ramsay who recommended admission for TIA work-up.  I discussed this with patient.  I will admit to medicine service for further evaluation and management.  Patient had 81 mg of ASA prior to arrival and so we will give her an additional 243 to get her to full dose today.      FINAL CLINICAL IMPRESSION(S) / ED DIAGNOSES   Final diagnoses:  TIA (transient ischemic attack)     Rx / DC Orders   ED Discharge Orders     None        Note:  This document was prepared using Dragon voice recognition software and may include unintentional dictation errors.   Lucrezia Starch, MD 07/27/21 [phone]

## 2021-07-27 NOTE — Telephone Encounter (Signed)
[facility] - Client TELEPHONE ADVICE RECORD Access[facility]urse Patient Name: Robin Logan NARD Gender: Female DOB: [birth year] Age: 86 Y 60 M 11 D Return Phone Number: [phone] (Primary) Address: City/ State/ Zip: [place]  [address] Client [facility] - Client Client Site [place] - Day Provider Viviana Simpler- MD Contact Type Call Who Is Calling Patient / Member / Family / Caregiver Call Type Triage / Clinical Relationship To Patient Self Return Phone Number [phone] (Primary) Chief Complaint NUMBNESS/TINGLING- sudden on one side of the body or face Reason for Call Symptomatic / Request for [place] states patient has left side arm numbness on her arm and hand. Translation No Nurse Assessment Nurse: [name], RN, [name] Date/Time (Eastern Time): 07/27/2021 11:25:15 AM Confirm and document reason for call. If symptomatic, describe symptoms. ---Caller stated that she is having lt arm and LH numbness. She has 911 there in the house now. The numbness lasted 1-2 min. Does the patient have any new or worsening symptoms? ---Yes Will a triage be completed? ---Yes Related visit to physician within the last 2 weeks? ---N/A Does the PT have any chronic conditions? (i.e. diabetes, asthma, this includes High risk factors for pregnancy, etc.) ---Unknown Is this a behavioral health or substance abuse call? ---No Guidelines Guideline Title Affirmed Question Affirmed Notes Nurse Date/Time (Eastern Time) Neurologic Deficit [1] Numbness (i.e., loss of sensation) of the face, arm / hand, or leg / foot on one side of the body AND [2] sudden onset AND [3] present now [name], RN, [name] 07/27/2021 11:26:16 AM Disp. Time [name] Time) Disposition Final User 07/27/2021 11:24:12 AM Send to Urgent Queue [name] 07/27/2021 11:31:01 AM 911 Outcome Documentation [name], RN,  [name] PLEASE NOTE: All timestamps contained within this report are represented as [place] Standard Time. CONFIDENTIALTY NOTICE: This fax transmission is intended only for the addressee. It contains information that is legally privileged, confidential or otherwise protected from use or disclosure. If you are not the intended recipient, you are strictly prohibited from reviewing, disclosing, copying using or disseminating any of this information or taking any action in reliance on or regarding this information. If you have received this fax in error, please notify us immediately by telephone so that we can arrange for its return to [place]. Phone: [phone], Toll-Free: [phone], Fax: [phone] Page: 2 of 2 Call Id: [id number] [name]. Time [name] Time) Disposition Final User Reason: Refused; see notes 07/27/2021 11:27:11 AM Call EMS 911 Now Yes [name], RN, [name] Disagree/Comply Disagree Caller Understands Yes PreDisposition InappropriateToAsk Care Advice Given Per Guideline CALL EMS 911 NOW: * Immediate medical attention is needed. You need to hang up and call 911 (or an ambulance). CARE ADVICE given per Neurologic Deficit (Adult) guideline. Comments User: [name], RN Date/Time [name] Time): 07/27/2021 11:29:49 AM I tried calling the backline at the office about pt refusing 911 transport, wanting to be seen. No one answered and unable to leave a message. Referrals GO TO FACILITY REFUSE

## 2021-07-27 NOTE — H&P (Signed)
History and Physical    LONEY PETO JEH:[id number] DOB: [birth year] DOA: 07/27/2021  PCP: Venia Carbon, MD  Patient coming from: Home.  Chief Complaint: Left upper extremity numbness.  HPI: Robin Logan is a 86 y.o. female with history of COPD, hyperlipidemia, hypothyroidism and prior TIA presents to the ER after patient experienced left upper extremity with mild weakness at about 10 AM this morning.  It lasted for less than 2 minutes.  Resolved without any intervention.  Denies any associated visual symptoms difficulty speaking swallowing or or weakness of the lower extremities.  ED Course: In the ER CT head did not show anything acute.  Neurologist on-call was consulted.  MRI brain shows punctate infarct involving the left frontal area.  MRA of the head and neck did not show any large vessel obstruction.  Neurology at this time recommended admitting for further work-up.  Labs are significant for sodium of 127.  COVID test negative EKG shows normal sinus rhythm.  Review of Systems: As per HPI, rest all negative.   Past Medical History:  Diagnosis Date   Arthritis    Depression    Hyperlipidemia    Osteopenia    Osteopenia    Sleep disorder    Thyroid disease     Past Surgical History:  Procedure Laterality Date   ABDOMINAL HYSTERECTOMY     APPENDECTOMY     CATARACT EXTRACTION W/PHACO Right 03/03/2019   Procedure: CATARACT EXTRACTION PHACO AND INTRAOCULAR LENS PLACEMENT (IOC) RIGHT  00:44.0  16.1%  7.12;  Surgeon: Leandrew Koyanagi, MD;  Location: [facility];  Service: Ophthalmology;  Laterality: Right;   CATARACT EXTRACTION W/PHACO Left 03/24/2019   Procedure: CATARACT EXTRACTION PHACO AND INTRAOCULAR LENS PLACEMENT (IOC) LEFT  01:09.4  12.5%  8.73;  Surgeon: Leandrew Koyanagi, MD;  Location: [facility];  Service: Ophthalmology;  Laterality: Left;   TONSILLECTOMY AND ADENOIDECTOMY  1946     reports that she quit smoking about 43 years ago.  Her smoking use included cigarettes. She has never used smokeless tobacco. She reports current alcohol use. She reports that she does not use drugs.  No Known Allergies  Family History  Problem Relation Age of Onset   Hypertension Father    Stroke Brother    Diabetes Brother    Cancer Neg Hx        no breast or colon    Prior to Admission medications   Medication Sig Start Date End Date Taking? Authorizing Provider  Ascorbic Acid (VITAMIN C) 1000 MG tablet Take 1,000 mg by mouth daily.    [provider]  aspirin EC 81 MG tablet Take 81 mg by mouth daily.    [provider]  atorvastatin (LIPITOR) 10 MG tablet TAKE 1 TABLET DAILY 01/05/21   Viviana Simpler I, MD  cholecalciferol (VITAMIN D) 1000 UNITS tablet Take 1,000 Units by mouth daily.    [provider]  ibuprofen (ADVIL,MOTRIN) 200 MG tablet Take 600 mg by mouth at bedtime as needed. For restless legs symptoms    [provider]  levothyroxine (SYNTHROID) 88 MCG tablet TAKE 1 TABLET DAILY 03/26/21   Venia Carbon, MD  metoprolol succinate (TOPROL-XL) 50 MG 24 hr tablet Take 0.5 tablets (25 mg total) by mouth daily. Take with or immediately following a meal. 01/04/21 04/04/21  Kate Sable, MD  mirabegron ER (MYRBETRIQ) 50 MG TB24 tablet Take 1 tablet (50 mg total) by mouth daily. 05/29/21   Venia Carbon, MD  mirtazapine (  REMERON) 45 MG tablet Take 1 tablet (45 mg total) by mouth at bedtime. 02/08/21   Venia Carbon, MD  Multiple Vitamins-Minerals (ICAPS AREDS 2 PO) Take by mouth.    [provider]  senna-docusate (SENOKOT-S) 8.6-50 MG per tablet Take 2-4 tablets by mouth 2 (two) times daily.    [provider]    Physical Exam: Constitutional: Moderately built and nourished. Vitals:   07/27/21 1156 07/27/21 1159  BP:  (!) 158/75  Pulse:  71  Resp:  18  Temp:  98.4 F (36.9 C)  TempSrc:  Oral  SpO2:  98%  Weight: 59 kg   Height: 5\' 4"  (1.626 m)    Eyes:  Anicteric no pallor. ENMT: No discharge from the ears eyes nose and mouth. Neck: No mass felt.  No neck rigidity. Respiratory: No rhonchi or crepitations. Cardiovascular: S1-S2 heard. Abdomen: Soft nontender bowel sound present. Musculoskeletal: No edema.  No rash. Skin: No rash. Neurologic: Alert awake oriented to time place and person.  Moves all extremities 5 x 5.  No facial asymmetry tongue is midline pupils are equal and reacting to light. Psychiatric: Appears normal.  Normal affect.   Labs on Admission: I have personally reviewed following labs and imaging studies  CBC: Recent Labs  Lab 07/27/21 1201  WBC 9.3  NEUTROABS 6.8  HGB 12.2  HCT 37.4  MCV 94.4  PLT 081   Basic Metabolic Panel: Recent Labs  Lab 07/27/21 1201  NA 127*  K 4.6  CL 92*  CO2 23  GLUCOSE 106*  BUN 18  CREATININE 0.82  CALCIUM 9.1   GFR: Estimated Creatinine Clearance: 44.9 mL/min (by C-G formula based on SCr of 0.82 mg/dL). Liver Function Tests: Recent Labs  Lab 07/27/21 1201  AST 38  ALT 30  ALKPHOS 75  BILITOT 0.6  PROT 7.1  ALBUMIN 4.1   No results for input(s): LIPASE, AMYLASE in the last 168 hours. No results for input(s): AMMONIA in the last 168 hours. Coagulation Profile: Recent Labs  Lab 07/27/21 1201  INR 0.9   Cardiac Enzymes: No results for input(s): CKTOTAL, CKMB, CKMBINDEX, TROPONINI in the last 168 hours. BNP (last 3 results) No results for input(s): PROBNP in the last 8760 hours. HbA1C: No results for input(s): HGBA1C in the last 72 hours. CBG: Recent Labs  Lab 07/27/21 1413  GLUCAP 87   Lipid Profile: Recent Labs    07/27/21 1414  CHOL 188  HDL 87  LDLCALC 78  TRIG 113  CHOLHDL 2.2   Thyroid Function Tests: Recent Labs    07/27/21 1414  TSH 6.347*   Anemia Panel: No results for input(s): VITAMINB12, FOLATE, FERRITIN, TIBC, IRON, RETICCTPCT in the last 72 hours. Urine analysis:    Component Value Date/Time   COLORURINE COLORLESS (A)  07/27/2021 1500   APPEARANCEUR CLEAR (A) 07/27/2021 1500   LABSPEC 1.004 (L) 07/27/2021 1500   PHURINE 7.0 07/27/2021 1500   GLUCOSEU NEGATIVE 07/27/2021 1500   HGBUR NEGATIVE 07/27/2021 1500   BILIRUBINUR NEGATIVE 07/27/2021 1500   BILIRUBINUR Negative 08/12/2018 1402   KETONESUR 5 (A) 07/27/2021 1500   PROTEINUR NEGATIVE 07/27/2021 1500   UROBILINOGEN 0.2 08/12/2018 1402   NITRITE NEGATIVE 07/27/2021 1500   LEUKOCYTESUR NEGATIVE 07/27/2021 1500   Sepsis Labs: @LABRCNTIP (procalcitonin:4,lacticidven:4) ) Recent Results (from the past 240 hour(s))  Resp Panel by RT-PCR (Flu A&B, Covid) Nasopharyngeal Swab     Status: None   Collection Time: 07/27/21  2:17 PM   Specimen: Nasopharyngeal Swab; Nasopharyngeal(NP) swabs  in vial transport medium  Result Value Ref Range Status   SARS Coronavirus 2 by RT PCR NEGATIVE NEGATIVE Final    Comment: (NOTE) SARS-CoV-2 target nucleic acids are NOT DETECTED.  The SARS-CoV-2 RNA is generally detectable in upper respiratory specimens during the acute phase of infection. The lowest concentration of SARS-CoV-2 viral copies this assay can detect is 138 copies/mL. A negative result does not preclude SARS-Cov-2 infection and should not be used as the sole basis for treatment or other patient management decisions. A negative result may occur with  improper specimen collection/handling, submission of specimen other than nasopharyngeal swab, presence of viral mutation(s) within the areas targeted by this assay, and inadequate number of viral copies(<138 copies/mL). A negative result must be combined with clinical observations, patient history, and epidemiological information. The expected result is Negative.  Fact Sheet for Patients:  EntrepreneurPulse.com.au  Fact Sheet for Healthcare Providers:  IncredibleEmployment.be  This test is no t yet approved or cleared by the [place] FDA and  has been authorized  for detection and/or diagnosis of SARS-CoV-2 by FDA under an Emergency Use Authorization (EUA). This EUA will remain  in effect (meaning this test can be used) for the duration of the COVID-19 declaration under Section 564(b)(1) of the Act, 21 U.S.C.section 360bbb-3(b)(1), unless the authorization is terminated  or revoked sooner.       Influenza A by PCR NEGATIVE NEGATIVE Final   Influenza B by PCR NEGATIVE NEGATIVE Final    Comment: (NOTE) The Xpert Xpress SARS-CoV-2/FLU/RSV plus assay is intended as an aid in the diagnosis of influenza from Nasopharyngeal swab specimens and should not be used as a sole basis for treatment. Nasal washings and aspirates are unacceptable for Xpert Xpress SARS-CoV-2/FLU/RSV testing.  Fact Sheet for Patients: EntrepreneurPulse.com.au  Fact Sheet for Healthcare Providers: IncredibleEmployment.be  This test is not yet approved or cleared by the [place] FDA and has been authorized for detection and/or diagnosis of SARS-CoV-2 by FDA under an Emergency Use Authorization (EUA). This EUA will remain in effect (meaning this test can be used) for the duration of the COVID-19 declaration under Section 564(b)(1) of the Act, 21 U.S.C. section 360bbb-3(b)(1), unless the authorization is terminated or revoked.  Performed at [facility], [address] [place], [place] [address]      Radiological Exams on Admission: CT HEAD WO CONTRAST  Result Date: 07/27/2021 CLINICAL DATA:  Stroke.  Follow-up.  Numbness in left arm. EXAM: CT HEAD WITHOUT CONTRAST TECHNIQUE: Contiguous axial images were obtained from the base of the skull through the vertex without intravenous contrast. RADIATION DOSE REDUCTION: This exam was performed according to the departmental dose-optimization program which includes automated exposure control, adjustment of the mA and/or kV according to patient size and/or use of iterative  reconstruction technique. COMPARISON:  CT brain 07/26/2020 FINDINGS: Brain: There is mild cortical atrophy, within normal limits for patient age. The ventricles are normal in configuration. The basilar cisterns are patent. No mass, mass effect, or midline shift. No acute intracranial hemorrhage is seen. No abnormal extra-axial fluid collection. Preservation of the normal cortical gray-white interface without CT evidence of an acute major vascular territorial cortical based infarction. Vascular: No hyperdense vessel or unexpected calcification. Skull: Normal. Negative for fracture or focal lesion. Sinuses/Orbits: The visualized orbits are unremarkable. The visualized paranasal sinuses and mastoid air cells are clear. Other: None. IMPRESSION:: IMPRESSION: 1. No significant change from prior. 2. No acute intracranial process. Electronically Signed   By: Viann Fish.D.  On: 07/27/2021 13:59   MR ANGIO HEAD WO CONTRAST  Result Date: 07/27/2021 CLINICAL DATA:  Stroke/TIA, determine embolic source; Transient ischemic attack (TIA) EXAM: MRI HEAD WITHOUT CONTRAST MRA HEAD WITHOUT CONTRAST MRA NECK WITHOUT AND WITH CONTRAST TECHNIQUE: Multiplanar, multi-echo pulse sequences of the brain and surrounding structures were acquired without intravenous contrast. Angiographic images of the Circle of Willis were acquired using MRA technique without intravenous contrast. Angiographic images of the neck were acquired using MRA technique without and with intravenous contrast. Carotid stenosis measurements (when applicable) are obtained utilizing NASCET criteria, using the distal internal carotid diameter as the denominator. CONTRAST:  30mL GADAVIST GADOBUTROL 1 MMOL/ML IV SOLN COMPARISON:  June 2021 FINDINGS: MRI HEAD Brain: Punctate focus of mildly reduced diffusion involving the medial cortex of the left superior frontal gyrus (series 5, image 37). Patchy and confluent areas of T2 hyperintensity in the supratentorial white  matter are nonspecific but may reflect stable chronic microvascular ischemic changes. Ventricles and sulci are stable and within normal limits in size and configuration. Scattered small foci of susceptibility in the cerebral subcortical white matter likely reflect chronic microhemorrhages. There is no intracranial mass or mass effect. There is no hydrocephalus or extra-axial fluid collection. Vascular: Major vessel flow voids at the skull base are preserved. Skull and upper cervical spine: Normal marrow signal is preserved. Sinuses/Orbits: Bilateral lens replacements. Orbits are unremarkable. Other: Sella is unremarkable.  Mastoid air cells are clear. MRA HEAD Intracranial internal carotid arteries are patent. Middle and anterior cerebral arteries are patent. Intracranial vertebral arteries, basilar artery, posterior cerebral arteries are patent. There is no significant stenosis or aneurysm. MRA NECK Great vessel origins are patent. Common, internal, and external carotid arteries are patent. Mild plaque is present at the ICA origins without stenosis. Vertebral arteries are patent and codominant. No stenosis. IMPRESSION: Punctate left frontal acute cortical infarct. Stable chronic microvascular ischemic changes. Stable chronic microhemorrhages in a pattern suggesting amyloid angiopathy. No large vessel occlusion or hemodynamically significant stenosis. Electronically Signed   By: Macy Mis M.D.   On: 07/27/2021 16:23   MR ANGIO NECK W WO CONTRAST  Result Date: 07/27/2021 CLINICAL DATA:  Stroke/TIA, determine embolic source; Transient ischemic attack (TIA) EXAM: MRI HEAD WITHOUT CONTRAST MRA HEAD WITHOUT CONTRAST MRA NECK WITHOUT AND WITH CONTRAST TECHNIQUE: Multiplanar, multi-echo pulse sequences of the brain and surrounding structures were acquired without intravenous contrast. Angiographic images of the Circle of Willis were acquired using MRA technique without intravenous contrast. Angiographic images of  the neck were acquired using MRA technique without and with intravenous contrast. Carotid stenosis measurements (when applicable) are obtained utilizing NASCET criteria, using the distal internal carotid diameter as the denominator. CONTRAST:  58mL GADAVIST GADOBUTROL 1 MMOL/ML IV SOLN COMPARISON:  June 2021 FINDINGS: MRI HEAD Brain: Punctate focus of mildly reduced diffusion involving the medial cortex of the left superior frontal gyrus (series 5, image 37). Patchy and confluent areas of T2 hyperintensity in the supratentorial white matter are nonspecific but may reflect stable chronic microvascular ischemic changes. Ventricles and sulci are stable and within normal limits in size and configuration. Scattered small foci of susceptibility in the cerebral subcortical white matter likely reflect chronic microhemorrhages. There is no intracranial mass or mass effect. There is no hydrocephalus or extra-axial fluid collection. Vascular: Major vessel flow voids at the skull base are preserved. Skull and upper cervical spine: Normal marrow signal is preserved. Sinuses/Orbits: Bilateral lens replacements. Orbits are unremarkable. Other: Sella is unremarkable.  Mastoid air cells are clear.  MRA HEAD Intracranial internal carotid arteries are patent. Middle and anterior cerebral arteries are patent. Intracranial vertebral arteries, basilar artery, posterior cerebral arteries are patent. There is no significant stenosis or aneurysm. MRA NECK Great vessel origins are patent. Common, internal, and external carotid arteries are patent. Mild plaque is present at the ICA origins without stenosis. Vertebral arteries are patent and codominant. No stenosis. IMPRESSION: Punctate left frontal acute cortical infarct. Stable chronic microvascular ischemic changes. Stable chronic microhemorrhages in a pattern suggesting amyloid angiopathy. No large vessel occlusion or hemodynamically significant stenosis. Electronically Signed   By: Macy Mis M.D.   On: 07/27/2021 16:23   MR BRAIN WO CONTRAST  Result Date: 07/27/2021 CLINICAL DATA:  Stroke/TIA, determine embolic source; Transient ischemic attack (TIA) EXAM: MRI HEAD WITHOUT CONTRAST MRA HEAD WITHOUT CONTRAST MRA NECK WITHOUT AND WITH CONTRAST TECHNIQUE: Multiplanar, multi-echo pulse sequences of the brain and surrounding structures were acquired without intravenous contrast. Angiographic images of the Circle of Willis were acquired using MRA technique without intravenous contrast. Angiographic images of the neck were acquired using MRA technique without and with intravenous contrast. Carotid stenosis measurements (when applicable) are obtained utilizing NASCET criteria, using the distal internal carotid diameter as the denominator. CONTRAST:  45mL GADAVIST GADOBUTROL 1 MMOL/ML IV SOLN COMPARISON:  June 2021 FINDINGS: MRI HEAD Brain: Punctate focus of mildly reduced diffusion involving the medial cortex of the left superior frontal gyrus (series 5, image 37). Patchy and confluent areas of T2 hyperintensity in the supratentorial white matter are nonspecific but may reflect stable chronic microvascular ischemic changes. Ventricles and sulci are stable and within normal limits in size and configuration. Scattered small foci of susceptibility in the cerebral subcortical white matter likely reflect chronic microhemorrhages. There is no intracranial mass or mass effect. There is no hydrocephalus or extra-axial fluid collection. Vascular: Major vessel flow voids at the skull base are preserved. Skull and upper cervical spine: Normal marrow signal is preserved. Sinuses/Orbits: Bilateral lens replacements. Orbits are unremarkable. Other: Sella is unremarkable.  Mastoid air cells are clear. MRA HEAD Intracranial internal carotid arteries are patent. Middle and anterior cerebral arteries are patent. Intracranial vertebral arteries, basilar artery, posterior cerebral arteries are patent. There is no  significant stenosis or aneurysm. MRA NECK Great vessel origins are patent. Common, internal, and external carotid arteries are patent. Mild plaque is present at the ICA origins without stenosis. Vertebral arteries are patent and codominant. No stenosis. IMPRESSION: Punctate left frontal acute cortical infarct. Stable chronic microvascular ischemic changes. Stable chronic microhemorrhages in a pattern suggesting amyloid angiopathy. No large vessel occlusion or hemodynamically significant stenosis. Electronically Signed   By: Macy Mis M.D.   On: 07/27/2021 16:23    EKG: Independently reviewed.  Normal sinus rhythm.  Assessment/Plan Principal Problem:   Acute CVA (cerebrovascular accident) ([place]) Active Problems:   Hypothyroidism   TIA (transient ischemic attack)   Hyponatremia    Acute CVA -discussed with Dr. Leonel Ramsay on-call neurologist who recommended to load with 300 mg of p.o. Plavix now and then following tomorrow 75 mg daily along with aspirin 81 mg daily.  Patient did pass stroke score.  We will keep patient on neurochecks.  MRA of the head and neck did not show any large vessel obstruction.  Check 2D echo continue to monitor in telemetry. Hyponatremia appears to be new.  Cause not clear.  Patient denies any nausea vomiting or diarrhea.  He denies any new medications.  We will check urine studies including urine sodium and urine  osmolality check serial metabolic panel and creatinine sodium.  Based on test ordered we will have further plans. History of SVT takes metoprolol. History of hyperlipidemia on statins.  Check lipid panel. History of hypothyroidism on Synthroid. Cough -patient has been coughing for the last 1 week.  Patient is afebrile.  On exam does not have any obvious wheezing.  Will check chest x-ray.   DVT prophylaxis: Lovenox. Code Status: DNR. Family Communication: Family at the bedside. Disposition Plan: Home. Consults called: Neurologist. Admission status:  Observation.   Rise Patience MD [facility] Pager [phone].  If 7PM-7AM, please contact night-coverage [email or web address] Password [facility]  07/27/2021, 4:45 PM

## 2021-07-27 NOTE — ED Notes (Signed)
Pt on phone with MRI

## 2021-07-27 NOTE — ED Notes (Signed)
Pt to ED because had transient L arm numbness this morning around 1030. NAD, alert and oriented with steady gait at this time.  Had TIA about 3 years ago. Husband at bedside.

## 2021-07-27 NOTE — Plan of Care (Signed)
°  Problem: Education: Goal: Knowledge of disease or condition will improve Outcome: Progressing Goal: Knowledge of secondary prevention will improve (SELECT ALL) Outcome: Progressing Goal: Knowledge of patient specific risk factors will improve (INDIVIDUALIZE FOR PATIENT) Outcome: Progressing Goal: Individualized Educational Video(s) Outcome: Progressing   Problem: Coping: Goal: Will verbalize positive feelings about self Outcome: Progressing Goal: Will identify appropriate support needs Outcome: Progressing   Problem: Health Behavior/Discharge Planning: Goal: Ability to manage health-related needs will improve Outcome: Progressing   Problem: Self-Care: Goal: Ability to participate in self-care as condition permits will improve Outcome: Progressing Goal: Ability to communicate needs accurately will improve Outcome: Progressing   Problem: Nutrition: Goal: Risk of aspiration will decrease Outcome: Progressing   Problem: Intracerebral Hemorrhage Tissue Perfusion: Goal: Complications of Intracerebral Hemorrhage will be minimized Outcome: Progressing   Problem: Ischemic Stroke/TIA Tissue Perfusion: Goal: Complications of ischemic stroke/TIA will be minimized Outcome: Progressing

## 2021-07-27 NOTE — ED Triage Notes (Signed)
Pt to ED via [facility] from [facility]. Pt reports the CNA was taking her vitals and told her she needed to come in for evaluation for her symptoms. Pt with hx TIA. Pt reports numbness in left arm on Wednesday that last about 28mins and another episode this morning that lasted 1-74mins. Pt reports also getting over a cough and has been taking dayquil and robitussin. Pt denies blood thinners.

## 2021-07-28 ENCOUNTER — Observation Stay (HOSPITAL_BASED_OUTPATIENT_CLINIC_OR_DEPARTMENT_OTHER)
Admit: 2021-07-28 | Discharge: 2021-07-28 | Disposition: A | Discharge status: Home / Self Care | Payer: Medicare Other | Attending: Internal Medicine | Admitting: Internal Medicine

## 2021-07-28 DIAGNOSIS — I6389 Other cerebral infarction: Secondary | ICD-10-CM

## 2021-07-28 DIAGNOSIS — I639 Cerebral infarction, unspecified: Secondary | ICD-10-CM

## 2021-07-28 DIAGNOSIS — E871 Hypo-osmolality and hyponatremia: Secondary | ICD-10-CM | POA: Diagnosis not present

## 2021-07-28 DIAGNOSIS — E7849 Other hyperlipidemia: Secondary | ICD-10-CM

## 2021-07-28 LAB — ECHOCARDIOGRAM COMPLETE
AR max vel: 1.62 cm2
AV Peak grad: 9.1 mmHg
Ao pk vel: 1.51 m/s
Area-P 1/2: 4.36 cm2
Height: 64 in
S' Lateral: 2.6 cm
Single Plane A4C EF: 59 %
Weight: 2080 oz

## 2021-07-28 LAB — BASIC METABOLIC PANEL
Anion gap: 10 (ref 5–15)
Anion gap: 10 (ref 5–15)
BUN: 16 mg/dL (ref 8–23)
BUN: 13 mg/dL (ref 8–23)
CO2: 22 mmol/L (ref 22–32)
CO2: 24 mmol/L (ref 22–32)
Calcium: 8.8 mg/dL — ABNORMAL LOW (ref 8.9–10.3)
Calcium: 8.9 mg/dL (ref 8.9–10.3)
Chloride: 96 mmol/L — ABNORMAL LOW (ref 98–111)
Chloride: 94 mmol/L — ABNORMAL LOW (ref 98–111)
Creatinine, Ser: 0.73 mg/dL (ref 0.44–1.00)
Creatinine, Ser: 0.72 mg/dL (ref 0.44–1.00)
GFR, Estimated: >60 mL/min (ref >60)
GFR, Estimated: >60 mL/min (ref >60)
Glucose, Bld: 87 mg/dL (ref 70–99)
Glucose, Bld: 89 mg/dL (ref 70–99)
Potassium: 4.3 mmol/L (ref 3.5–5.1)
Potassium: 4.4 mmol/L (ref 3.5–5.1)
Sodium: 128 mmol/L — ABNORMAL LOW (ref 135–145)
Sodium: 128 mmol/L — ABNORMAL LOW (ref 135–145)

## 2021-07-28 LAB — LIPID PANEL
Cholesterol: 173 mg/dL (ref 0–200)
HDL: 71 mg/dL (ref >40)
LDL Cholesterol: 91 mg/dL (ref 0–99)
Total CHOL/HDL Ratio: 2.4 RATIO
Triglycerides: 56 mg/dL (ref <150)
VLDL: 11 mg/dL (ref 0–40)

## 2021-07-28 MED ORDER — ASPIRIN EC 81 MG PO TBEC: 81.0000 mg | DELAYED_RELEASE_TABLET | Freq: Every day | ORAL | 11 refills | Status: DC

## 2021-07-28 MED ORDER — ATORVASTATIN CALCIUM 80 MG PO TABS: 80.0000 mg | ORAL_TABLET | Freq: Every day | ORAL | 0 refills | Status: DC

## 2021-07-28 MED ORDER — CLOPIDOGREL BISULFATE 75 MG PO TABS: 75.0000 mg | ORAL_TABLET | Freq: Every day | ORAL | 0 refills | Status: DC

## 2021-07-28 NOTE — Discharge Summary (Signed)
Physician Discharge Summary  [name] TSV:[id number] DOB: 08/27/1935 DOA: 07/27/2021  PCP: Venia Carbon, MD  Admit date: 07/27/2021 Discharge date: 07/28/2021  Admitted From: home  Disposition:  home   Recommendations for Outpatient Follow-up:  Follow up with PCP in 1-2 weeks F/u w/ neuro in 1-2 weeks   Home Health: no  Equipment/Devices:  Discharge Condition: stable  CODE STATUS: DNR Diet recommendation: Heart Healthy   Brief/Interim Summary: HPI was taken from Dr. Hal Hope: Robin Logan is a 86 y.o. female with history of COPD, hyperlipidemia, hypothyroidism and prior TIA presents to the ER after patient experienced left upper extremity with mild weakness at about 10 AM this morning.  It lasted for less than 2 minutes.  Resolved without any intervention.  Denies any associated visual symptoms difficulty speaking swallowing or or weakness of the lower extremities.   ED Course: In the ER CT head did not show anything acute.  Neurologist on-call was consulted.  MRI brain shows punctate infarct involving the left frontal area.  MRA of the head and neck did not show any large vessel obstruction.  Neurology at this time recommended admitting for further work-up.  Labs are significant for sodium of 127.  COVID test negative EKG shows normal sinus rhythm.  As per Dr. Jimmye Norman 07/28/21: Pt presented w/ left upper extremity numbness w/ mild weakness and was found to have an acute CVA. Pt was already taking aspirin and pt was started on plavix as per neuro. Pt will continue aspirin, plavix x 3 weeks and then d/c aspirin and continue plavix monotherapy. A zio patch will be mailed to pt's house w/ instructions as per cardio (Dr. Garen Lah- no cardio consult was placed). PT/OT evaluated the pt but pt did not need any further therapy. For more information, please see previous progress/consult notes.   Discharge Diagnoses:  Principal Problem:   Acute CVA (cerebrovascular accident)  ([place]) Active Problems:   Hypothyroidism   TIA (transient ischemic attack)   Hyponatremia  Acute CVA: continue on aspirin, plavix x 3 weeks and then just continue plavix as per neuro. Continue w/ neuro checks. MRA head & neck did not show any large vessel obstruction. Echo showed EF 92-33%, normal diastolic dysfunction & no atrial level shunt detected. Neuro following and recs apprec  Hyponatremia: stable from day prior.   Hx of SVT: continue on home dose of metoprolol  HLD: continue on statin   Hypothyroidism: continue on home dose of synthroid   Discharge Instructions  Discharge Instructions     Diet - low sodium heart healthy   Complete by: As directed    Discharge instructions   Complete by: As directed    F/u w/ PCP in 1-2 weeks. F/u w/ neuro in 1-2 weeks. A zio patch/heart monitor will be sent to you in the mail by the request of the neurologist. This heart monitor will monitor for abnormal heart rhythms. Continue on aspirin and plavix x 3 weeks, then stop aspirin and continue plavix.   Increase activity slowly   Complete by: As directed       Allergies as of 07/28/2021   No Known Allergies      Medication List     STOP taking these medications    ibuprofen 200 MG tablet Commonly known as: ADVIL       TAKE these medications    aspirin EC 81 MG tablet Take 1 tablet (81 mg total) by mouth daily. Continue on aspirin and plavix x 3 weeks  and then stop aspirin and just continue plavix What changed: additional instructions   atorvastatin 80 MG tablet Commonly known as: Lipitor Take 1 tablet (80 mg total) by mouth daily. What changed:  medication strength how much to take   cholecalciferol 1000 units tablet Commonly known as: VITAMIN D Take 1,000 Units by mouth daily.   clopidogrel 75 MG tablet Commonly known as: PLAVIX Take 1 tablet (75 mg total) by mouth daily. Start taking on: July 29, 2021   ICAPS AREDS 2 PO Take by mouth.   levothyroxine 88  MCG tablet Commonly known as: SYNTHROID TAKE 1 TABLET DAILY   metoprolol succinate 50 MG 24 hr tablet Commonly known as: TOPROL-XL Take 0.5 tablets (25 mg total) by mouth daily. Take with or immediately following a meal.   mirabegron ER 50 MG Tb24 tablet Commonly known as: MYRBETRIQ Take 1 tablet (50 mg total) by mouth daily.   mirtazapine 45 MG tablet Commonly known as: REMERON Take 1 tablet (45 mg total) by mouth at bedtime.   senna-docusate 8.6-50 MG tablet Commonly known as: Senokot-S Take 2-4 tablets by mouth 2 (two) times daily.   vitamin C 1000 MG tablet Take 1,000 mg by mouth daily.        No Known Allergies  Consultations: Neuro    Procedures/Studies: CT HEAD WO CONTRAST  Result Date: 07/27/2021 CLINICAL DATA:  Stroke.  Follow-up.  Numbness in left arm. EXAM: CT HEAD WITHOUT CONTRAST TECHNIQUE: Contiguous axial images were obtained from the base of the skull through the vertex without intravenous contrast. RADIATION DOSE REDUCTION: This exam was performed according to the departmental dose-optimization program which includes automated exposure control, adjustment of the mA and/or kV according to patient size and/or use of iterative reconstruction technique. COMPARISON:  CT brain 07/26/2020 FINDINGS: Brain: There is mild cortical atrophy, within normal limits for patient age. The ventricles are normal in configuration. The basilar cisterns are patent. No mass, mass effect, or midline shift. No acute intracranial hemorrhage is seen. No abnormal extra-axial fluid collection. Preservation of the normal cortical gray-white interface without CT evidence of an acute major vascular territorial cortical based infarction. Vascular: No hyperdense vessel or unexpected calcification. Skull: Normal. Negative for fracture or focal lesion. Sinuses/Orbits: The visualized orbits are unremarkable. The visualized paranasal sinuses and mastoid air cells are clear. Other: None. IMPRESSION::  IMPRESSION: 1. No significant change from prior. 2. No acute intracranial process. Electronically Signed   By: Yvonne Kendall M.D.   On: 07/27/2021 13:59   MR ANGIO HEAD WO CONTRAST  Result Date: 07/27/2021 CLINICAL DATA:  Stroke/TIA, determine embolic source; Transient ischemic attack (TIA) EXAM: MRI HEAD WITHOUT CONTRAST MRA HEAD WITHOUT CONTRAST MRA NECK WITHOUT AND WITH CONTRAST TECHNIQUE: Multiplanar, multi-echo pulse sequences of the brain and surrounding structures were acquired without intravenous contrast. Angiographic images of the Circle of Willis were acquired using MRA technique without intravenous contrast. Angiographic images of the neck were acquired using MRA technique without and with intravenous contrast. Carotid stenosis measurements (when applicable) are obtained utilizing NASCET criteria, using the distal internal carotid diameter as the denominator. CONTRAST:  47mL GADAVIST GADOBUTROL 1 MMOL/ML IV SOLN COMPARISON:  June 2021 FINDINGS: MRI HEAD Brain: Punctate focus of mildly reduced diffusion involving the medial cortex of the left superior frontal gyrus (series 5, image 37). Patchy and confluent areas of T2 hyperintensity in the supratentorial white matter are nonspecific but may reflect stable chronic microvascular ischemic changes. Ventricles and sulci are stable and within normal limits in size  and configuration. Scattered small foci of susceptibility in the cerebral subcortical white matter likely reflect chronic microhemorrhages. There is no intracranial mass or mass effect. There is no hydrocephalus or extra-axial fluid collection. Vascular: Major vessel flow voids at the skull base are preserved. Skull and upper cervical spine: Normal marrow signal is preserved. Sinuses/Orbits: Bilateral lens replacements. Orbits are unremarkable. Other: Sella is unremarkable.  Mastoid air cells are clear. MRA HEAD Intracranial internal carotid arteries are patent. Middle and anterior cerebral  arteries are patent. Intracranial vertebral arteries, basilar artery, posterior cerebral arteries are patent. There is no significant stenosis or aneurysm. MRA NECK Great vessel origins are patent. Common, internal, and external carotid arteries are patent. Mild plaque is present at the ICA origins without stenosis. Vertebral arteries are patent and codominant. No stenosis. IMPRESSION: Punctate left frontal acute cortical infarct. Stable chronic microvascular ischemic changes. Stable chronic microhemorrhages in a pattern suggesting amyloid angiopathy. No large vessel occlusion or hemodynamically significant stenosis. Electronically Signed   By: Macy Mis M.D.   On: 07/27/2021 16:23   MR ANGIO NECK W WO CONTRAST  Result Date: 07/27/2021 CLINICAL DATA:  Stroke/TIA, determine embolic source; Transient ischemic attack (TIA) EXAM: MRI HEAD WITHOUT CONTRAST MRA HEAD WITHOUT CONTRAST MRA NECK WITHOUT AND WITH CONTRAST TECHNIQUE: Multiplanar, multi-echo pulse sequences of the brain and surrounding structures were acquired without intravenous contrast. Angiographic images of the Circle of Willis were acquired using MRA technique without intravenous contrast. Angiographic images of the neck were acquired using MRA technique without and with intravenous contrast. Carotid stenosis measurements (when applicable) are obtained utilizing NASCET criteria, using the distal internal carotid diameter as the denominator. CONTRAST:  10mL GADAVIST GADOBUTROL 1 MMOL/ML IV SOLN COMPARISON:  June 2021 FINDINGS: MRI HEAD Brain: Punctate focus of mildly reduced diffusion involving the medial cortex of the left superior frontal gyrus (series 5, image 37). Patchy and confluent areas of T2 hyperintensity in the supratentorial white matter are nonspecific but may reflect stable chronic microvascular ischemic changes. Ventricles and sulci are stable and within normal limits in size and configuration. Scattered small foci of susceptibility  in the cerebral subcortical white matter likely reflect chronic microhemorrhages. There is no intracranial mass or mass effect. There is no hydrocephalus or extra-axial fluid collection. Vascular: Major vessel flow voids at the skull base are preserved. Skull and upper cervical spine: Normal marrow signal is preserved. Sinuses/Orbits: Bilateral lens replacements. Orbits are unremarkable. Other: Sella is unremarkable.  Mastoid air cells are clear. MRA HEAD Intracranial internal carotid arteries are patent. Middle and anterior cerebral arteries are patent. Intracranial vertebral arteries, basilar artery, posterior cerebral arteries are patent. There is no significant stenosis or aneurysm. MRA NECK Great vessel origins are patent. Common, internal, and external carotid arteries are patent. Mild plaque is present at the ICA origins without stenosis. Vertebral arteries are patent and codominant. No stenosis. IMPRESSION: Punctate left frontal acute cortical infarct. Stable chronic microvascular ischemic changes. Stable chronic microhemorrhages in a pattern suggesting amyloid angiopathy. No large vessel occlusion or hemodynamically significant stenosis. Electronically Signed   By: Macy Mis M.D.   On: 07/27/2021 16:23   MR BRAIN WO CONTRAST  Result Date: 07/27/2021 CLINICAL DATA:  Stroke/TIA, determine embolic source; Transient ischemic attack (TIA) EXAM: MRI HEAD WITHOUT CONTRAST MRA HEAD WITHOUT CONTRAST MRA NECK WITHOUT AND WITH CONTRAST TECHNIQUE: Multiplanar, multi-echo pulse sequences of the brain and surrounding structures were acquired without intravenous contrast. Angiographic images of the Circle of Willis were acquired using MRA technique without intravenous contrast.  Angiographic images of the neck were acquired using MRA technique without and with intravenous contrast. Carotid stenosis measurements (when applicable) are obtained utilizing NASCET criteria, using the distal internal carotid diameter as  the denominator. CONTRAST:  53mL GADAVIST GADOBUTROL 1 MMOL/ML IV SOLN COMPARISON:  June 2021 FINDINGS: MRI HEAD Brain: Punctate focus of mildly reduced diffusion involving the medial cortex of the left superior frontal gyrus (series 5, image 37). Patchy and confluent areas of T2 hyperintensity in the supratentorial white matter are nonspecific but may reflect stable chronic microvascular ischemic changes. Ventricles and sulci are stable and within normal limits in size and configuration. Scattered small foci of susceptibility in the cerebral subcortical white matter likely reflect chronic microhemorrhages. There is no intracranial mass or mass effect. There is no hydrocephalus or extra-axial fluid collection. Vascular: Major vessel flow voids at the skull base are preserved. Skull and upper cervical spine: Normal marrow signal is preserved. Sinuses/Orbits: Bilateral lens replacements. Orbits are unremarkable. Other: Sella is unremarkable.  Mastoid air cells are clear. MRA HEAD Intracranial internal carotid arteries are patent. Middle and anterior cerebral arteries are patent. Intracranial vertebral arteries, basilar artery, posterior cerebral arteries are patent. There is no significant stenosis or aneurysm. MRA NECK Great vessel origins are patent. Common, internal, and external carotid arteries are patent. Mild plaque is present at the ICA origins without stenosis. Vertebral arteries are patent and codominant. No stenosis. IMPRESSION: Punctate left frontal acute cortical infarct. Stable chronic microvascular ischemic changes. Stable chronic microhemorrhages in a pattern suggesting amyloid angiopathy. No large vessel occlusion or hemodynamically significant stenosis. Electronically Signed   By: Macy Mis M.D.   On: 07/27/2021 16:23   DG Chest Port 1 View  Result Date: 07/27/2021 CLINICAL DATA:  Productive cough for 1 week. EXAM: PORTABLE CHEST 1 VIEW COMPARISON:  03/05/2019 FINDINGS: The heart size and  mediastinal contours are within normal limits. Both lungs are clear. Thoracolumbar scoliosis again noted. IMPRESSION: No active disease. Electronically Signed   By: Marlaine Hind M.D.   On: 07/27/2021 17:35   ECHOCARDIOGRAM COMPLETE  Result Date: 07/28/2021    ECHOCARDIOGRAM REPORT   Patient Name:   Robin Logan Date of Exam: 07/28/2021 Medical Rec #:  [id number]        Height:       64.0 in Accession #:    [id number]       Weight:       130.0 lb Date of Birth:  04-11-1936        BSA:          1.629 m Patient Age:    86 years         BP:           150/77 mmHg Patient Gender: F                HR:           87 bpm. Exam Location:  [facility] Procedure: 2D Echo Indications:     Stroke I63.9  History:         Patient has prior history of Echocardiogram examinations, most                  recent 11/09/2019.  Sonographer:     Kathlen Brunswick RDCS Referring Phys:  [address] Diagnosing Phys: Kate Sable MD IMPRESSIONS  1. Left ventricular ejection fraction, by estimation, is 60 to 65%. The left ventricle has normal function. The left ventricle has no regional wall motion abnormalities.  Left ventricular diastolic parameters were normal.  2. Right ventricular systolic function is normal. The right ventricular size is normal.  3. The mitral valve is grossly normal. No evidence of mitral valve regurgitation.  4. The aortic valve was not well visualized. Aortic valve regurgitation is not visualized.  5. The inferior vena cava is dilated in size with >50% respiratory variability, suggesting right atrial pressure of 8 mmHg. FINDINGS  Left Ventricle: Left ventricular ejection fraction, by estimation, is 60 to 65%. The left ventricle has normal function. The left ventricle has no regional wall motion abnormalities. The left ventricular internal cavity size was normal in size. There is  no left ventricular hypertrophy. Left ventricular diastolic parameters were normal. Right Ventricle: The right ventricular size is  normal. No increase in right ventricular wall thickness. Right ventricular systolic function is normal. Left Atrium: Left atrial size was normal in size. Right Atrium: Right atrial size was normal in size. Pericardium: There is no evidence of pericardial effusion. Mitral Valve: The mitral valve is grossly normal. Mild mitral annular calcification. No evidence of mitral valve regurgitation. Tricuspid Valve: The tricuspid valve is normal in structure. Tricuspid valve regurgitation is mild. Aortic Valve: The aortic valve was not well visualized. Aortic valve regurgitation is not visualized. Aortic valve peak gradient measures 9.1 mmHg. Pulmonic Valve: The pulmonic valve was not well visualized. Pulmonic valve regurgitation is not visualized. Aorta: The aortic root is normal in size and structure. Venous: The inferior vena cava is dilated in size with greater than 50% respiratory variability, suggesting right atrial pressure of 8 mmHg. IAS/Shunts: No atrial level shunt detected by color flow Doppler.  LEFT VENTRICLE PLAX 2D LVIDd:         3.70 cm     Diastology LVIDs:         2.60 cm     LV e' medial:    7.29 cm/s LV PW:         0.90 cm     LV E/e' medial:  9.2 LV IVS:        0.90 cm     LV e' lateral:   9.14 cm/s LVOT diam:     1.80 cm     LV E/e' lateral: 7.4 LV SV:         46 LV SV Index:   28 LVOT Area:     2.54 cm  LV Volumes (MOD) LV vol d, MOD A4C: 64.6 ml LV vol s, MOD A4C: 26.5 ml LV SV MOD A4C:     64.6 ml RIGHT VENTRICLE RV Basal diam:  2.50 cm RV S prime:     13.30 cm/s TAPSE (M-mode): 2.4 cm LEFT ATRIUM           Index       RIGHT ATRIUM           Index LA diam:      2.10 cm 1.29 cm/m  RA Area:     12.60 cm LA Vol (A2C): 12.2 ml 7.49 ml/m  RA Volume:   30.50 ml  18.72 ml/m LA Vol (A4C): 13.4 ml 8.23 ml/m  AORTIC VALVE                 PULMONIC VALVE AV Area (Vmax): 1.62 cm     PV Vmax:       1.03 m/s AV Vmax:        151.00 cm/s  PV Peak grad:  4.2 mmHg AV Peak Grad:   9.1 mmHg LVOT  Vmax:      96.00  cm/s LVOT Vmean:     60.300 cm/s LVOT VTI:       0.179 m  AORTA Ao Root diam: 2.70 cm MITRAL VALVE               TRICUSPID VALVE MV Area (PHT): 4.36 cm    TV Peak grad:   25.1 mmHg MV Decel Time: 174 msec    TV Vmax:        2.51 m/s MV E velocity: 67.30 cm/s MV A velocity: 69.80 cm/s  SHUNTS MV E/A ratio:  0.96        Systemic VTI:  0.18 m                            Systemic Diam: 1.80 cm Kate Sable MD Electronically signed by Kate Sable MD Signature Date/Time: 07/28/2021/11:43:44 AM    Final    (Echo, Carotid, EGD, Colonoscopy, ERCP)    Subjective: Pt denies any complaints    Discharge Exam: Vitals:   07/28/21 0817 07/28/21 1139  BP: (!) 149/83 133/68  Pulse: 89 75  Resp: 17 17  Temp: 98 F (36.7 C) 98.1 F (36.7 C)  SpO2: 98% 99%   Vitals:   07/28/21 0032 07/28/21 0608 07/28/21 0817 07/28/21 1139  BP: (!) 150/77 134/77 (!) 149/83 133/68  Pulse: 79 77 89 75  Resp: 16 16 17 17   Temp: 98.2 F (36.8 C) 98.7 F (37.1 C) 98 F (36.7 C) 98.1 F (36.7 C)  TempSrc: Oral Oral  Oral  SpO2: 99% 97% 98% 99%  Weight:      Height:        General: Pt is alert, awake, not in acute distress Cardiovascular:  S1/S2 +, no rubs, no gallops Respiratory: CTA bilaterally, no wheezing, no rhonchi Abdominal: Soft, NT, ND, bowel sounds + Extremities: no edema, no cyanosis    The results of significant diagnostics from this hospitalization (including imaging, microbiology, ancillary and laboratory) are listed below for reference.     Microbiology: Recent Results (from the past 240 hour(s))  Resp Panel by RT-PCR (Flu A&B, Covid) Nasopharyngeal Swab     Status: None   Collection Time: 07/27/21  2:17 PM   Specimen: Nasopharyngeal Swab; Nasopharyngeal(NP) swabs in vial transport medium  Result Value Ref Range Status   SARS Coronavirus 2 by RT PCR NEGATIVE NEGATIVE Final    Comment: (NOTE) SARS-CoV-2 target nucleic acids are NOT DETECTED.  The SARS-CoV-2 RNA is generally  detectable in upper respiratory specimens during the acute phase of infection. The lowest concentration of SARS-CoV-2 viral copies this assay can detect is 138 copies/mL. A negative result does not preclude SARS-Cov-2 infection and should not be used as the sole basis for treatment or other patient management decisions. A negative result may occur with  improper specimen collection/handling, submission of specimen other than nasopharyngeal swab, presence of viral mutation(s) within the areas targeted by this assay, and inadequate number of viral copies(<138 copies/mL). A negative result must be combined with clinical observations, patient history, and epidemiological information. The expected result is Negative.  Fact Sheet for Patients:  EntrepreneurPulse.com.au  Fact Sheet for Healthcare Providers:  IncredibleEmployment.be  This test is no t yet approved or cleared by the [place] FDA and  has been authorized for detection and/or diagnosis of SARS-CoV-2 by FDA under an Emergency Use Authorization (EUA). This EUA will remain  in effect (meaning this test can  be used) for the duration of the COVID-19 declaration under Section 564(b)(1) of the Act, 21 U.S.C.section 360bbb-3(b)(1), unless the authorization is terminated  or revoked sooner.       Influenza A by PCR NEGATIVE NEGATIVE Final   Influenza B by PCR NEGATIVE NEGATIVE Final    Comment: (NOTE) The Xpert Xpress SARS-CoV-2/FLU/RSV plus assay is intended as an aid in the diagnosis of influenza from Nasopharyngeal swab specimens and should not be used as a sole basis for treatment. Nasal washings and aspirates are unacceptable for Xpert Xpress SARS-CoV-2/FLU/RSV testing.  Fact Sheet for Patients: EntrepreneurPulse.com.au  Fact Sheet for Healthcare Providers: IncredibleEmployment.be  This test is not yet approved or cleared by the [place] FDA  and has been authorized for detection and/or diagnosis of SARS-CoV-2 by FDA under an Emergency Use Authorization (EUA). This EUA will remain in effect (meaning this test can be used) for the duration of the COVID-19 declaration under Section 564(b)(1) of the Act, 21 U.S.C. section 360bbb-3(b)(1), unless the authorization is terminated or revoked.  Performed at [facility], [address] [place], [place] [address]      Labs: BNP (last 3 results) No results for input(s): BNP in the last 8760 hours. Basic Metabolic Panel: Recent Labs  Lab 07/27/21 1201 07/27/21 2040 07/28/21 0127 07/28/21 0447  NA 127* 128* 128* 128*  K 4.6 4.3 4.3 4.4  CL 92* 96* 96* 94*  CO2 23 23 22 24   GLUCOSE 106* 133* 87 89  BUN 18 17 16 13   CREATININE 0.82 0.70 0.73 0.72  CALCIUM 9.1 8.9 8.8* 8.9   Liver Function Tests: Recent Labs  Lab 07/27/21 1201  AST 38  ALT 30  ALKPHOS 75  BILITOT 0.6  PROT 7.1  ALBUMIN 4.1   No results for input(s): LIPASE, AMYLASE in the last 168 hours. No results for input(s): AMMONIA in the last 168 hours. CBC: Recent Labs  Lab 07/27/21 1201  WBC 9.3  NEUTROABS 6.8  HGB 12.2  HCT 37.4  MCV 94.4  PLT 268   Cardiac Enzymes: No results for input(s): CKTOTAL, CKMB, CKMBINDEX, TROPONINI in the last 168 hours. BNP: Invalid input(s): POCBNP CBG: Recent Labs  Lab 07/27/21 1413  GLUCAP 87   D-Dimer No results for input(s): DDIMER in the last 72 hours. Hgb A1c Recent Labs    07/27/21 1414  HGBA1C 5.9*   Lipid Profile Recent Labs    07/27/21 1414 07/28/21 0447  CHOL 188 173  HDL 87 71  LDLCALC 78 91  TRIG 113 56  CHOLHDL 2.2 2.4   Thyroid function studies Recent Labs    07/27/21 1414  TSH 6.347*   Anemia work up No results for input(s): VITAMINB12, FOLATE, FERRITIN, TIBC, IRON, RETICCTPCT in the last 72 hours. Urinalysis    Component Value Date/Time   COLORURINE COLORLESS (A) 07/27/2021 1500   APPEARANCEUR CLEAR (A)  07/27/2021 1500   LABSPEC 1.004 (L) 07/27/2021 1500   PHURINE 7.0 07/27/2021 1500   GLUCOSEU NEGATIVE 07/27/2021 1500   HGBUR NEGATIVE 07/27/2021 1500   BILIRUBINUR NEGATIVE 07/27/2021 1500   BILIRUBINUR Negative 08/12/2018 1402   KETONESUR 5 (A) 07/27/2021 1500   PROTEINUR NEGATIVE 07/27/2021 1500   UROBILINOGEN 0.2 08/12/2018 1402   NITRITE NEGATIVE 07/27/2021 1500   LEUKOCYTESUR NEGATIVE 07/27/2021 1500   Sepsis Labs Invalid input(s): PROCALCITONIN,  WBC,  LACTICIDVEN Microbiology Recent Results (from the past 240 hour(s))  Resp Panel by RT-PCR (Flu A&B, Covid) Nasopharyngeal Swab     Status: None  Collection Time: 07/27/21  2:17 PM   Specimen: Nasopharyngeal Swab; Nasopharyngeal(NP) swabs in vial transport medium  Result Value Ref Range Status   SARS Coronavirus 2 by RT PCR NEGATIVE NEGATIVE Final    Comment: (NOTE) SARS-CoV-2 target nucleic acids are NOT DETECTED.  The SARS-CoV-2 RNA is generally detectable in upper respiratory specimens during the acute phase of infection. The lowest concentration of SARS-CoV-2 viral copies this assay can detect is 138 copies/mL. A negative result does not preclude SARS-Cov-2 infection and should not be used as the sole basis for treatment or other patient management decisions. A negative result may occur with  improper specimen collection/handling, submission of specimen other than nasopharyngeal swab, presence of viral mutation(s) within the areas targeted by this assay, and inadequate number of viral copies(<138 copies/mL). A negative result must be combined with clinical observations, patient history, and epidemiological information. The expected result is Negative.  Fact Sheet for Patients:  EntrepreneurPulse.com.au  Fact Sheet for Healthcare Providers:  IncredibleEmployment.be  This test is no t yet approved or cleared by the [place] FDA and  has been authorized for detection and/or  diagnosis of SARS-CoV-2 by FDA under an Emergency Use Authorization (EUA). This EUA will remain  in effect (meaning this test can be used) for the duration of the COVID-19 declaration under Section 564(b)(1) of the Act, 21 U.S.C.section 360bbb-3(b)(1), unless the authorization is terminated  or revoked sooner.       Influenza A by PCR NEGATIVE NEGATIVE Final   Influenza B by PCR NEGATIVE NEGATIVE Final    Comment: (NOTE) The Xpert Xpress SARS-CoV-2/FLU/RSV plus assay is intended as an aid in the diagnosis of influenza from Nasopharyngeal swab specimens and should not be used as a sole basis for treatment. Nasal washings and aspirates are unacceptable for Xpert Xpress SARS-CoV-2/FLU/RSV testing.  Fact Sheet for Patients: EntrepreneurPulse.com.au  Fact Sheet for Healthcare Providers: IncredibleEmployment.be  This test is not yet approved or cleared by the [place] FDA and has been authorized for detection and/or diagnosis of SARS-CoV-2 by FDA under an Emergency Use Authorization (EUA). This EUA will remain in effect (meaning this test can be used) for the duration of the COVID-19 declaration under Section 564(b)(1) of the Act, 21 U.S.C. section 360bbb-3(b)(1), unless the authorization is terminated or revoked.  Performed at [facility], [address] [place], [place] [address]      Time coordinating discharge: Over 30 minutes  SIGNED:   Wyvonnia Dusky, MD  [facility] 07/28/2021, 1:20 PM Pager   If 7PM-7AM, please contact night-coverage

## 2021-07-28 NOTE — Plan of Care (Signed)
°  Problem: Education: Goal: Knowledge of disease or condition will improve Outcome: Progressing Goal: Knowledge of secondary prevention will improve (SELECT ALL) Outcome: Progressing Goal: Knowledge of patient specific risk factors will improve (INDIVIDUALIZE FOR PATIENT) Outcome: Progressing Goal: Individualized Educational Video(s) Outcome: Progressing   Problem: Coping: Goal: Will verbalize positive feelings about self Outcome: Progressing Goal: Will identify appropriate support needs Outcome: Progressing   Problem: Health Behavior/Discharge Planning: Goal: Ability to manage health-related needs will improve Outcome: Progressing   Problem: Self-Care: Goal: Ability to participate in self-care as condition permits will improve Outcome: Progressing Goal: Ability to communicate needs accurately will improve Outcome: Progressing   Problem: Nutrition: Goal: Risk of aspiration will decrease Outcome: Progressing   Problem: Intracerebral Hemorrhage Tissue Perfusion: Goal: Complications of Intracerebral Hemorrhage will be minimized Outcome: Progressing   Problem: Ischemic Stroke/TIA Tissue Perfusion: Goal: Complications of ischemic stroke/TIA will be minimized Outcome: Progressing

## 2021-07-28 NOTE — Evaluation (Signed)
Physical Therapy Evaluation Patient Details Name: Robin Logan MRN: [id number] DOB: 11-14-1935 Today's Date: 07/28/2021  History of Present Illness  Robin Logan is a 86 y.o. female with history of COPD, hyperlipidemia, hypothyroidism and prior TIA presents to the ER after patient experienced left upper extremity with mild weakness at about 10 AM on 2/24. It lasted for less than 2 minutes.  Resolved without any intervention.  Denies any associated visual symptoms difficulty speaking swallowing or or weakness of the lower extremities.   Clinical Impression  Physical Therapy Evaluation completed this date. Patient tolerated session well and was agreeable to treatment. Patient reported living at [place] with her husband, and does yoga 3x/week. Patient reports feeling at her baseline with no sensory, motor, or coordination deficits. Patient was able to demonstrate independence with bed mobility and sit to stand transfers with no assistance or AD. Strength and ROM in BUE and BLEs is WNL. She was able to ambulate around the nurses station two times performing high level balance activities such as alternating speeds and horizontal and vertical head turns with no LOB or unsteadiness noted independently. Patient is demonstrating at baseline function and does not require additional physical therapy. Signing off.     Recommendations for follow up therapy are one component of a multi-disciplinary discharge planning process, led by the attending physician.  Recommendations may be updated based on patient status, additional functional criteria and insurance authorization.  Follow Up Recommendations No PT follow up    Assistance Recommended at Discharge None  Patient can return home with the following       Equipment Recommendations None recommended by PT  Recommendations for Other Services       Functional Status Assessment       Precautions / Restrictions Precautions Precautions:  Fall Restrictions Weight Bearing Restrictions: No      Mobility  Bed Mobility Overal bed mobility: Independent                  Transfers Overall transfer level: Independent Equipment used: None                    Ambulation/Gait Ambulation/Gait assistance: Independent Gait Distance (Feet): 300 Feet Assistive device: None     Gait velocity interpretation: >4.37 ft/sec, indicative of normal walking speed   General Gait Details: Patient able to demonstrate high level balance assessments such as horizontal/vertical head turns, alternating speeds during ambulation with no LOB or unsteadiness noted  Stairs Stairs: Yes Stairs assistance: Independent Stair Management: One rail Right Number of Stairs: 4 General stair comments: step through pattern  Wheelchair Mobility    Modified Rankin (Stroke Patients Only)       Balance Overall balance assessment: Independent                                           Pertinent Vitals/Pain Pain Assessment Pain Assessment: No/denies pain    Home Living Family/patient expects to be discharged to:: Assisted living Living Arrangements: Spouse/significant other Available Help at Discharge: Family Type of Home: Assisted living           Home Equipment: Shower [place] (2 wheels);Rollator (4 wheels) Additional Comments: Patient does yoga 3x/week    Prior Function Prior Level of Function : Independent/Modified Independent  Hand Dominance   Dominant Hand: Right    Extremity/Trunk Assessment   Upper Extremity Assessment Upper Extremity Assessment: Overall WFL for tasks assessed    Lower Extremity Assessment Lower Extremity Assessment: Overall WFL for tasks assessed    Cervical / Trunk Assessment Cervical / Trunk Assessment: Normal  Communication   Communication: No difficulties  Cognition Arousal/Alertness: Awake/alert Behavior During Therapy:  WFL for tasks assessed/performed Overall Cognitive Status: Within Functional Limits for tasks assessed                                 General Comments: A&Ox3        General Comments      Exercises Other Exercises Other Exercises: patient educated on role of PT in acute setting   Assessment/Plan    PT Assessment Patient does not need any further PT services  PT Problem List         PT Treatment Interventions      PT Goals (Current goals can be found in the Care Plan section)  Acute Rehab PT Goals Patient Stated Goal: to hurry up and get home PT Goal Formulation: With patient Time For Goal Achievement: 08/11/21 Potential to Achieve Goals: Good    Frequency       Co-evaluation               AM-PAC PT "6 Clicks" Mobility  Outcome Measure Help needed turning from your back to your side while in a flat bed without using bedrails?: None Help needed moving from lying on your back to sitting on the side of a flat bed without using bedrails?: None Help needed moving to and from a bed to a chair (including a wheelchair)?: None Help needed standing up from a chair using your arms (e.g., wheelchair or bedside chair)?: None Help needed to walk in hospital room?: None Help needed climbing 3-5 steps with a railing? : None 6 Click Score: 24    End of Session Equipment Utilized During Treatment: Gait belt Activity Tolerance: Patient tolerated treatment well Patient left: in bed;with call bell/phone within reach;with family/visitor present Nurse Communication: Mobility status PT Visit Diagnosis: Other symptoms and signs involving the nervous system (R29.898);Unsteadiness on feet (R26.81);Muscle weakness (generalized) (M62.81);Difficulty in walking, not elsewhere classified (R26.2)    Time: 9201-0071 PT Time Calculation (min) (ACUTE ONLY): 13 min   Charges:   PT Evaluation $PT Eval Low Complexity: 1 Low          Iva Boop, PT  07/28/21. 10:38  AM

## 2021-07-28 NOTE — Consult Note (Signed)
Neurology Consultation Reason for Consult: Left arm numbness Referring Physician: Lenise Herald  CC: Left-arm numbness  History is obtained from: Patient  HPI: Robin Logan is a 86 y.o. female with a history of hyperlipidemia who presents with transient left arm numbness.  She states that it only lasted a few minutes, and it went away.  She came into the emergency department to get checked out and was admitted for TIA work-up.  As her work-up, she received an MRI/MRA head and neck which revealed a small punctate stroke in the LEFT hemisphere.  She is currently back to baseline denies any current symptoms.  She denies any visual change, trouble walking, nausea, vomiting or other symptoms.   LKW: Unclear tpa given?: no, unclear time of onset NIHSS: Zero  Past Medical History:  Diagnosis Date   Arthritis    Depression    Hyperlipidemia    Osteopenia    Osteopenia    Sleep disorder    Thyroid disease      Family History  Problem Relation Age of Onset   Hypertension Father    Stroke Brother    Diabetes Brother    Cancer Neg Hx        no breast or colon     Social History:  reports that she quit smoking about 43 years ago. Her smoking use included cigarettes. She has never used smokeless tobacco. She reports current alcohol use. She reports that she does not use drugs.   Exam: Current vital signs: BP 133/68 (BP Location: Right Arm)    Pulse 75    Temp 98.1 F (36.7 C) (Oral)    Resp 17    Ht 5\' 4"  (1.626 m)    Wt 59 kg    SpO2 99%    BMI 22.31 kg/m  Vital signs in last 24 hours: Temp:  [98 F (36.7 C)-98.7 F (37.1 C)] 98.1 F (36.7 C) (02/25 1139) Pulse Rate:  [70-89] 75 (02/25 1139) Resp:  [16-17] 17 (02/25 1139) BP: (110-150)/(65-83) 133/68 (02/25 1139) SpO2:  [97 %-99 %] 99 % (02/25 1139)   Physical Exam  Constitutional: Appears well-developed and well-nourished.  Psych: Affect appropriate to situation Eyes: No scleral injection HENT: No OP  obstruction MSK: no joint deformities.  Cardiovascular: Normal rate and regular rhythm.  Respiratory: Effort normal, non-labored breathing GI: Soft.  No distension. There is no tenderness.  Skin: WDI  Neuro: Mental Status: Patient is awake, alert, oriented to person, place, month, year, and situation. Patient is able to give a clear and coherent history. No signs of aphasia or neglect Cranial Nerves: II: Visual Fields are full. Pupils are equal, round, and reactive to light.   III,IV, VI: EOMI without ptosis or diploplia.  V: Facial sensation is symmetric to temperature VII: Facial movement is symmetric.  VIII: hearing is intact to voice X: Uvula elevates symmetrically XI: Shoulder shrug is symmetric. XII: tongue is midline without atrophy or fasciculations.  Motor: Tone is normal. Bulk is normal. 5/5 strength was present in all four extremities.  Sensory: Sensation is symmetric to light touch and temperature in the arms and legs. Cerebellar: FNF intact bilaterally      I have reviewed labs in epic and the results pertinent to this consultation are: LDL 78 A1C 5.9   I have reviewed the images obtained:MRI brain - punctate L frontal stroke.  MRA head and neck - negative.   Impression: 86 year old female with punctate cortical frontal stroke.  This is unrelated to her  symptoms given that it is ipsilateral, and therefore suspect she has had two events relatively recently.  Given the cortical nature of her stroke, embolus is certainly a possibility and I would favor prolonged cardiac monitoring to assess for intermittent atrial fibrillation.  Recommendations: 1) increase atorvastatin given LDL just outside of range 2) dual antiplatelet therapy with aspirin 81 mg and Plavix 75 mg daily for 3 weeks followed by monotherapy with Plavix. 3) PT, OT, ST have evaluated with no recommendations 4) recommend prolonged cardiac monitoring, consider implanted loop recorder 5) follow-up  with outpatient neurology   Roland Rack, MD [facility] [phone]  If 7pm- 7am, please page neurology on call as listed in [place].

## 2021-07-28 NOTE — Plan of Care (Signed)
°  Problem: Education: Goal: Knowledge of disease or condition will improve 07/28/2021 1333 by Kensington Duerst, Camillo Flaming, RN Outcome: Adequate for Discharge 07/28/2021 1054 by Averi Cacioppo, Camillo Flaming, RN Outcome: Progressing Goal: Knowledge of secondary prevention will improve (SELECT ALL) 07/28/2021 1333 by Daisy Blossom, RN Outcome: Adequate for Discharge 07/28/2021 1054 by Basim Bartnik, Camillo Flaming, RN Outcome: Progressing Goal: Knowledge of patient specific risk factors will improve (INDIVIDUALIZE FOR PATIENT) 07/28/2021 1333 by Daisy Blossom, RN Outcome: Adequate for Discharge 07/28/2021 1054 by Jamichael Knotts, Camillo Flaming, RN Outcome: Progressing Goal: Individualized Educational Video(s) 07/28/2021 1333 by Daisy Blossom, RN Outcome: Adequate for Discharge 07/28/2021 1054 by Harol Shabazz, Camillo Flaming, RN Outcome: Progressing   Problem: Coping: Goal: Will verbalize positive feelings about self 07/28/2021 1333 by Hutson Luft, Camillo Flaming, RN Outcome: Adequate for Discharge 07/28/2021 1054 by Kandee Escalante, Camillo Flaming, RN Outcome: Progressing Goal: Will identify appropriate support needs 07/28/2021 1333 by Durand Wittmeyer, Camillo Flaming, RN Outcome: Adequate for Discharge 07/28/2021 1054 by Marlette Curvin, Camillo Flaming, RN Outcome: Progressing   Problem: Health Behavior/Discharge Planning: Goal: Ability to manage health-related needs will improve 07/28/2021 1333 by Calin Ellery, Camillo Flaming, RN Outcome: Adequate for Discharge 07/28/2021 1054 by Taylee Gunnells, Camillo Flaming, RN Outcome: Progressing   Problem: Self-Care: Goal: Ability to participate in self-care as condition permits will improve 07/28/2021 1333 by Shanette Tamargo, Camillo Flaming, RN Outcome: Adequate for Discharge 07/28/2021 1054 by Asianna Brundage, Camillo Flaming, RN Outcome: Progressing Goal: Ability to communicate needs accurately will improve 07/28/2021 1333 by Dencil Cayson, Camillo Flaming, RN Outcome: Adequate for Discharge 07/28/2021 1054 by Tamani Durney, Camillo Flaming, RN Outcome: Progressing   Problem: Nutrition: Goal:  Risk of aspiration will decrease 07/28/2021 1333 by Montay Vanvoorhis, Camillo Flaming, RN Outcome: Adequate for Discharge 07/28/2021 1054 by Farryn Linares, Camillo Flaming, RN Outcome: Progressing   Problem: Intracerebral Hemorrhage Tissue Perfusion: Goal: Complications of Intracerebral Hemorrhage will be minimized 07/28/2021 1333 by Tisheena Maguire, Camillo Flaming, RN Outcome: Adequate for Discharge 07/28/2021 1054 by Mallorey Odonell, Camillo Flaming, RN Outcome: Progressing   Problem: Ischemic Stroke/TIA Tissue Perfusion: Goal: Complications of ischemic stroke/TIA will be minimized 07/28/2021 1333 by Prisca Gearing, Camillo Flaming, RN Outcome: Adequate for Discharge 07/28/2021 1054 by Raymonda Pell, Camillo Flaming, RN Outcome: Progressing

## 2021-07-28 NOTE — Evaluation (Signed)
Occupational Therapy Evaluation Patient Details Name: LILYAN PRETE MRN: [id number] DOB: 1935/12/18 Today's Date: 07/28/2021   History of Present Illness QUORRA ROSENE is a 86 y.o. female with history of COPD, hyperlipidemia, hypothyroidism and prior TIA presents to the ER after patient experienced left upper extremity with mild weakness at about 10 AM on 2/24. It lasted for less than 2 minutes.  Resolved without any intervention.  Denies any associated visual symptoms difficulty speaking swallowing or or weakness of the lower extremities.   Clinical Impression   Ms. Helfand presents today following an episode of L UE weakness/numbness. During today's evaluation, she demonstrated 5/5 UE strength bilaterally, with no appreciable bilateral difference in UE ROM or finger dexterity. Pt is IND in bed mobility, transfers, ambulation w/o AD, grooming, toileting. She lives with her husband in the independent living section of [place], she drives, is active in the community, exercises regularly. She denies pain at present and reports no falls in previous 6 months. Pt appears to be at her baseline level of fxl mobility; pt and therapist in agreement that pt has no ongoing OT needs at this time. Will sign off.    Recommendations for follow up therapy are one component of a multi-disciplinary discharge planning process, led by the attending physician.  Recommendations may be updated based on patient status, additional functional criteria and insurance authorization.   Follow Up Recommendations  No OT follow up    Assistance Recommended at Discharge PRN  Patient can return home with the following      Functional Status Assessment  Patient has had a recent decline in their functional status and demonstrates the ability to make significant improvements in function in a reasonable and predictable amount of time.  Equipment Recommendations  None recommended by OT    Recommendations for Other  Services       Precautions / Restrictions Precautions Precautions: None Restrictions Weight Bearing Restrictions: No      Mobility Bed Mobility Overal bed mobility: Independent                  Transfers Overall transfer level: Independent Equipment used: None                      Balance Overall balance assessment: Independent                                         ADL either performed or assessed with clinical judgement   ADL Overall ADL's : Independent;At baseline                                             Vision Patient Visual Report: No change from baseline       Perception     Praxis      Pertinent Vitals/Pain Pain Assessment Pain Assessment: No/denies pain     Hand Dominance Right   Extremity/Trunk Assessment Upper Extremity Assessment Upper Extremity Assessment: Overall WFL for tasks assessed   Lower Extremity Assessment Lower Extremity Assessment: Overall WFL for tasks assessed   Cervical / Trunk Assessment Cervical / Trunk Assessment: Normal   Communication Communication Communication: No difficulties   Cognition Arousal/Alertness: Awake/alert Behavior During Therapy: WFL for tasks assessed/performed Overall Cognitive Status: Within Functional Limits for  tasks assessed                                 General Comments: Alert and oriented, talkative and pleasant     General Comments       Exercises Other Exercises Other Exercises: Educ re: HEP, health promotion, falls prevention   Shoulder Instructions      Home Living Family/patient expects to be discharged to:: Private residence Living Arrangements: Spouse/significant other Available Help at Discharge: Family Type of Home: Independent living facility Home Access: Level entry     Home Layout: One level     Bathroom Shower/Tub: Occupational psychologist: Handicapped height     Home Equipment:  Advice worker (2 wheels);Rollator (4 wheels)   Additional Comments: Lives in the IND living cottages at [facility].      Prior Functioning/Environment Prior Level of Function : Independent/Modified Independent             Mobility Comments: Does not use any DME. Does yoga 3x/wk. No falls history. ADLs Comments: IND in ADL/IADL, active        OT Problem List: Decreased strength      OT Treatment/Interventions:      OT Goals(Current goals can be found in the care plan section) Acute Rehab OT Goals Patient Stated Goal: to go home today OT Goal Formulation: With patient Time For Goal Achievement: 08/11/21 Potential to Achieve Goals: Good  OT Frequency:      Co-evaluation              AM-PAC OT "6 Clicks" Daily Activity     Outcome Measure Help from another person eating meals?: None Help from another person taking care of personal grooming?: None Help from another person toileting, which includes using toliet, bedpan, or urinal?: None Help from another person bathing (including washing, rinsing, drying)?: None Help from another person to put on and taking off regular upper body clothing?: None Help from another person to put on and taking off regular lower body clothing?: None 6 Click Score: 24   End of Session    Activity Tolerance: Patient tolerated treatment well Patient left: in bed;with call bell/phone within reach;with family/visitor present  OT Visit Diagnosis: Muscle weakness (generalized) (M62.81)                Time: 2395-3202 OT Time Calculation (min): 9 min Charges:  OT General Charges $OT Visit: 1 Visit OT Evaluation $OT Eval Low Complexity: 1 Low OT Treatments $Self Care/Home Management : 8-22 mins Josiah Lobo, PhD, MS, OTR/L 07/28/21, 10:50 AM

## 2021-07-28 NOTE — Progress Notes (Signed)
SLP Cancellation Note  Patient Details Name: Robin Logan MRN: [id number] DOB: [birth year]   Cancelled treatment:       Reason Eval/Treat Not Completed: SLP screened, no needs identified, will sign off (chart reviewed; consulted pt and husband in room). Pt denied any difficulty swallowing and is currently on a regular diet; tolerates swallowing pills w/ water per NSG. Pt conversed in conversation w/out expressive/receptive deficits noted; pt denied any speech-language deficits. Speech clear.She enjoyed conversation; is min HOH w/out her HAs(at home). No further skilled ST services indicated as pt appears at her baseline. Pt agreed. NSG to reconsult if any change in status while admitted.       Orinda Kenner, MS, CCC-SLP Speech Language Pathologist Rehab Services; [place] [phone] (ascom) Shakila Mak 07/28/2021, 12:33 PM

## 2021-07-29 NOTE — Telephone Encounter (Signed)
Was admitted for TIA ---but discharged the next day. Will need follow up with me soon

## 2021-07-30 ENCOUNTER — Telehealth: Payer: Self-pay

## 2021-07-30 ENCOUNTER — Ambulatory Visit (INDEPENDENT_AMBULATORY_CARE_PROVIDER_SITE_OTHER): Payer: Medicare Other

## 2021-07-30 DIAGNOSIS — I6389 Other cerebral infarction: Secondary | ICD-10-CM

## 2021-07-30 DIAGNOSIS — I679 Cerebrovascular disease, unspecified: Secondary | ICD-10-CM

## 2021-07-30 DIAGNOSIS — I639 Cerebral infarction, unspecified: Secondary | ICD-10-CM

## 2021-07-30 NOTE — Telephone Encounter (Addendum)
Transition Care Management Unsuccessful Follow-up Telephone Call  Date of discharge and from where:  TCM DC [facility] 07-28-21 Dx: Acute CVA  Attempts:  1st Attempt  Reason for unsuccessful TCM follow-up call:  Unable to leave message  Transition Care Management Follow-up Telephone Call Date of discharge and from where: TCM DC [facility] 07-28-21 Dx: Acute CVA How have you been since you were released from the hospital? Doing better  Any questions or concerns? No  Items Reviewed: Did the pt receive and understand the discharge instructions provided? Yes  Medications obtained and verified? Yes  Other? No  Any new allergies since your discharge? No  Dietary orders reviewed? Yes Do you have support at home? Yes   Home Care and Equipment/Supplies: Were home health services ordered? no If so, what is the name of the agency? na  Has the agency set up a time to come to the patient's home? not applicable Were any new equipment or medical supplies ordered?  No What is the name of the medical supply agency? na Were you able to get the supplies/equipment? not applicable Do you have any questions related to the use of the equipment or supplies? No  Functional Questionnaire: (I = Independent and D = Dependent) ADLs: I  Bathing/Dressing- I  Meal Prep- I  Eating- I  Maintaining continence- I  Transferring/Ambulation- I  Managing Meds- I  Follow up appointments reviewed:  PCP Hospital f/u appt confirmed? Yes  Scheduled to see Dr Silvio Pate on 08-03-21 @ [facility] f/u appt confirmed? No . Are transportation arrangements needed? No  If their condition worsens, is the pt aware to call PCP or go to the Emergency Dept.? Yes Was the patient provided with contact information for the PCP's office or ED? Yes Was to pt encouraged to call back with questions or concerns? Yes

## 2021-07-30 NOTE — Telephone Encounter (Signed)
-----   Message from Valora Corporal, RN sent at 07/30/2021  7:51 AM EST -----  ----- Message ----- From: Kate Sable, MD Sent: 07/28/2021   1:04 PM EST To: [facility] Triage, [facility] Scheduling  Please mail/place cardiac monitor (zio) x 2 weeks. Recent admit for CVA.  Thanks

## 2021-07-30 NOTE — Telephone Encounter (Signed)
Called patient and informed her that we have ordered a Zio XT monitor that we have sent out to her house, I advised that she follow the directions included for application, and that she wear it for 14 days. Scheduled patient for a follow up on 09/04/21. Patient verbalized understanding and agreed with plan.

## 2021-08-03 ENCOUNTER — Encounter: Payer: Self-pay | Admitting: Emergency Medicine

## 2021-08-03 ENCOUNTER — Other Ambulatory Visit: Payer: Self-pay

## 2021-08-03 ENCOUNTER — Encounter: Payer: Self-pay | Admitting: Internal Medicine

## 2021-08-03 ENCOUNTER — Observation Stay
Admission: EM | Admit: 2021-08-03 | Discharge: 2021-08-04 | Disposition: A | Discharge status: Home / Self Care | Payer: Medicare Other | Attending: Hospitalist | Admitting: Hospitalist

## 2021-08-03 ENCOUNTER — Ambulatory Visit (INDEPENDENT_AMBULATORY_CARE_PROVIDER_SITE_OTHER): Payer: Medicare Other | Admitting: Internal Medicine

## 2021-08-03 ENCOUNTER — Emergency Department: Payer: Medicare Other

## 2021-08-03 VITALS — BP 118/78 | HR 68 | Temp 97.5°F | Ht 63.0 in | Wt 132.0 lb

## 2021-08-03 DIAGNOSIS — D649 Anemia, unspecified: Secondary | ICD-10-CM | POA: Insufficient documentation

## 2021-08-03 DIAGNOSIS — G459 Transient cerebral ischemic attack, unspecified: Secondary | ICD-10-CM | POA: Diagnosis not present

## 2021-08-03 DIAGNOSIS — R202 Paresthesia of skin: Secondary | ICD-10-CM | POA: Diagnosis not present

## 2021-08-03 DIAGNOSIS — Z87891 Personal history of nicotine dependence: Secondary | ICD-10-CM | POA: Insufficient documentation

## 2021-08-03 DIAGNOSIS — Z7982 Long term (current) use of aspirin: Secondary | ICD-10-CM | POA: Diagnosis not present

## 2021-08-03 DIAGNOSIS — I471 Supraventricular tachycardia: Secondary | ICD-10-CM

## 2021-08-03 DIAGNOSIS — E871 Hypo-osmolality and hyponatremia: Secondary | ICD-10-CM | POA: Diagnosis not present

## 2021-08-03 DIAGNOSIS — Z20822 Contact with and (suspected) exposure to covid-19: Secondary | ICD-10-CM | POA: Insufficient documentation

## 2021-08-03 DIAGNOSIS — J449 Chronic obstructive pulmonary disease, unspecified: Secondary | ICD-10-CM | POA: Diagnosis not present

## 2021-08-03 DIAGNOSIS — E039 Hypothyroidism, unspecified: Secondary | ICD-10-CM | POA: Diagnosis present

## 2021-08-03 DIAGNOSIS — R200 Anesthesia of skin: Secondary | ICD-10-CM | POA: Diagnosis not present

## 2021-08-03 DIAGNOSIS — F39 Unspecified mood [affective] disorder: Secondary | ICD-10-CM | POA: Diagnosis not present

## 2021-08-03 DIAGNOSIS — Z79899 Other long term (current) drug therapy: Secondary | ICD-10-CM | POA: Diagnosis not present

## 2021-08-03 DIAGNOSIS — I639 Cerebral infarction, unspecified: Principal | ICD-10-CM | POA: Insufficient documentation

## 2021-08-03 DIAGNOSIS — Z8673 Personal history of transient ischemic attack (TIA), and cerebral infarction without residual deficits: Secondary | ICD-10-CM

## 2021-08-03 DIAGNOSIS — Z743 Need for continuous supervision: Secondary | ICD-10-CM | POA: Diagnosis not present

## 2021-08-03 DIAGNOSIS — R9431 Abnormal electrocardiogram [ECG] [EKG]: Secondary | ICD-10-CM | POA: Diagnosis not present

## 2021-08-03 DIAGNOSIS — R5381 Other malaise: Secondary | ICD-10-CM | POA: Diagnosis not present

## 2021-08-03 LAB — CBC
HCT: 34.3 % — ABNORMAL LOW (ref 36.0–46.0)
Hemoglobin: 11.4 g/dL — ABNORMAL LOW (ref 12.0–15.0)
MCH: 30.9 pg (ref 26.0–34.0)
MCHC: 33.2 g/dL (ref 30.0–36.0)
MCV: 93 fL (ref 80.0–100.0)
Platelets: 287 10*3/uL (ref 150–400)
RBC: 3.69 MIL/uL — ABNORMAL LOW (ref 3.87–5.11)
RDW: 12.7 % (ref 11.5–15.5)
WBC: 9 10*3/uL (ref 4.0–10.5)
nRBC: 0 % (ref 0.0–0.2)

## 2021-08-03 LAB — DIFFERENTIAL
Abs Immature Granulocytes: 0.07 10*3/uL (ref 0.00–0.07)
Basophils Absolute: 0 10*3/uL (ref 0.0–0.1)
Basophils Relative: 0 %
Eosinophils Absolute: 0.4 10*3/uL (ref 0.0–0.5)
Eosinophils Relative: 4 %
Immature Granulocytes: 1 %
Lymphocytes Relative: 12 %
Lymphs Abs: 1.1 10*3/uL (ref 0.7–4.0)
Monocytes Absolute: 0.9 10*3/uL (ref 0.1–1.0)
Monocytes Relative: 10 %
Neutro Abs: 6.5 10*3/uL (ref 1.7–7.7)
Neutrophils Relative %: 73 %

## 2021-08-03 LAB — COMPREHENSIVE METABOLIC PANEL
ALT: 30 U/L (ref 0–44)
AST: 31 U/L (ref 15–41)
Albumin: 3.8 g/dL (ref 3.5–5.0)
Alkaline Phosphatase: 74 U/L (ref 38–126)
Anion gap: 12 (ref 5–15)
BUN: 23 mg/dL (ref 8–23)
CO2: 23 mmol/L (ref 22–32)
Calcium: 9 mg/dL (ref 8.9–10.3)
Chloride: 93 mmol/L — ABNORMAL LOW (ref 98–111)
Creatinine, Ser: 0.84 mg/dL (ref 0.44–1.00)
GFR, Estimated: >60 mL/min (ref >60)
Glucose, Bld: 117 mg/dL — ABNORMAL HIGH (ref 70–99)
Potassium: 4.6 mmol/L (ref 3.5–5.1)
Sodium: 128 mmol/L — ABNORMAL LOW (ref 135–145)
Total Bilirubin: 0.3 mg/dL (ref 0.3–1.2)
Total Protein: 6.9 g/dL (ref 6.5–8.1)

## 2021-08-03 LAB — CBG MONITORING, ED: Glucose-Capillary: 127 mg/dL — ABNORMAL HIGH (ref 70–99)

## 2021-08-03 LAB — APTT: aPTT: 32 seconds (ref 24–36)

## 2021-08-03 LAB — PROTIME-INR
INR: 0.9 (ref 0.8–1.2)
Prothrombin Time: 12.4 seconds (ref 11.4–15.2)

## 2021-08-03 NOTE — ED Notes (Signed)
Per ems pt with left sided arm tingling last week and diagnosed with TIA. Per ems started on plavix and asa last week. Per ems pt had two minutes of left leg tingling tonight that has abated. Vss per ems.  ?

## 2021-08-03 NOTE — ED Notes (Signed)
Patient speaking with Teleneurology ?

## 2021-08-03 NOTE — ED Notes (Signed)
Patient transported to CT 

## 2021-08-03 NOTE — ED Notes (Signed)
Called teleneuro for consult per Alfred Levins, MD ?

## 2021-08-03 NOTE — ED Notes (Signed)
Patient was provided with a beverage and dysphagia screen was completed and passed. Patient stated that she had a dry cough for the last few days previous that has been going around her facility but this did not deter her dysphagia screening. ?

## 2021-08-03 NOTE — Assessment & Plan Note (Signed)
No symptoms on the metoprolol 50 ?

## 2021-08-03 NOTE — Assessment & Plan Note (Signed)
Had left frontal infarct--small ?This is a mystery----since it should not cause her left arm sensory changes ?I am also concerned about the finding of possible cerebral amyloidosis ?Will set up with Dr Manuella Ghazi ?ASA 81mg  for 2 more weeks ?Plavix to continue indefinitely ?Now on high dose atorvastatin--80mg  ?BP control is good ? ?Will be getting zio monitor in mail ?Will go to Dr Garen Lah after to review this ?

## 2021-08-03 NOTE — Progress Notes (Signed)
? ?Subjective:  ? ? Patient ID: Robin Logan, female    DOB: 06/12/1935, 86 y.o.   MRN: [id number] ? ?HPI ?Here for hospital follow up ?With husband ?Reviewed records, discharge summary and MRI/MRA showing small acute left frontal infarct. Stable microvascular changes and pattern suggesting amyloid angiopathy. ? ?"I feel tired" ?Energy levels are low ?Still having cough--as well as husband ? ?Noticed numbness in her left arm 2/24 (had had brief symptoms 2 days before also) ?Called nurse at [facility]---but no answer (so she pulled cord) ?The emergency response person came--and referred her for ER evaluation ?Waited for husband---and he dropped her off at ER ? ?Started on plavix ?Atorvastatin increased to 80mg   ? ?Hasn't gotten zio monitor yet ?No palpitations ?No chest pain or SOB ?Does have follow up scheduled with Dr Garen Lah ? ?Didn't get appointment with neurologist set up ?Wants to see Dr Manuella Ghazi in [place] ? ?Has gone back to yoga ?Avoiding prolonged standing--no dizziness ?No recurrent arm symptoms ? ?Current Outpatient Medications on File Prior to Visit  ?Medication Sig Dispense Refill  ? Ascorbic Acid (VITAMIN C) 1000 MG tablet Take 1,000 mg by mouth daily.    ? aspirin EC 81 MG tablet Take 1 tablet (81 mg total) by mouth daily. Continue on aspirin and plavix x 3 weeks and then stop aspirin and just continue plavix 30 tablet 11  ? atorvastatin (LIPITOR) 80 MG tablet Take 1 tablet (80 mg total) by mouth daily. 30 tablet 0  ? cholecalciferol (VITAMIN D) 1000 UNITS tablet Take 1,000 Units by mouth daily.    ? clopidogrel (PLAVIX) 75 MG tablet Take 1 tablet (75 mg total) by mouth daily. 30 tablet 0  ? levothyroxine (SYNTHROID) 88 MCG tablet TAKE 1 TABLET DAILY 90 tablet 3  ? mirabegron ER (MYRBETRIQ) 50 MG TB24 tablet Take 1 tablet (50 mg total) by mouth daily. 90 tablet 3  ? mirtazapine (REMERON) 45 MG tablet Take 1 tablet (45 mg total) by mouth at bedtime. 90 tablet 3  ? Multiple Vitamins-Minerals  (ICAPS AREDS 2 PO) Take by mouth.    ? senna-docusate (SENOKOT-S) 8.6-50 MG per tablet Take 2-4 tablets by mouth 2 (two) times daily.    ? metoprolol succinate (TOPROL-XL) 50 MG 24 hr tablet Take 0.5 tablets (25 mg total) by mouth daily. Take with or immediately following a meal. 90 tablet 2  ? ?No current facility-administered medications on file prior to visit.  ? ? ?No Known Allergies ? ?Past Medical History:  ?Diagnosis Date  ? Arthritis   ? Depression   ? Hyperlipidemia   ? Osteopenia   ? Osteopenia   ? Sleep disorder   ? Thyroid disease   ? ? ?Past Surgical History:  ?Procedure Laterality Date  ? ABDOMINAL HYSTERECTOMY    ? APPENDECTOMY    ? CATARACT EXTRACTION W/PHACO Right 03/03/2019  ? Procedure: CATARACT EXTRACTION PHACO AND INTRAOCULAR LENS PLACEMENT (IOC) RIGHT  00:44.0  16.1%  7.12;  Surgeon: Leandrew Koyanagi, MD;  Location: [facility];  Service: Ophthalmology;  Laterality: Right;  ? CATARACT EXTRACTION W/PHACO Left 03/24/2019  ? Procedure: CATARACT EXTRACTION PHACO AND INTRAOCULAR LENS PLACEMENT (IOC) LEFT  01:09.4  12.5%  8.73;  Surgeon: Leandrew Koyanagi, MD;  Location: [facility];  Service: Ophthalmology;  Laterality: Left;  ? [place]  ? ? ?Family History  ?Problem Relation Age of Onset  ? Hypertension Father   ? Stroke Brother   ? Diabetes Brother   ? Cancer  Neg Hx   ?     no breast or colon  ? ? ?Social History  ? ?Socioeconomic History  ? Marital status: Married  ?  Spouse name: Not on file  ? Number of children: Not on file  ? Years of education: Not on file  ? Highest education level: Not on file  ?Occupational History  ? Occupation: retired- Production manager of deeds for [employer]  ?Tobacco Use  ? Smoking status: Former  ?  Types: Cigarettes  ?  Quit date: 06/03/1978  ?  Years since quitting: 43.1  ?  Passive exposure: Past  ? Smokeless tobacco: Never  ?Vaping Use  ? Vaping Use: Never used  ?Substance and Sexual Activity  ? Alcohol use: Yes   ?  Alcohol/week: 0.0 standard drinks  ?  Comment: wine  ? Drug use: No  ? Sexual activity: Not on file  ?Other Topics Concern  ? Not on file  ?Social History Narrative  ? Requests DNR --order done 12/24/10. Currently keeping it in draw, would accept resuscitation for now  ? Has living will  ? Husband is health care POA---alternate would be nieces (both sides)  ? No tube feedings if cognitively unaware  ? ?Social Determinants of Health  ? ?Financial Resource Strain: Not on file  ?Food Insecurity: Not on file  ?Transportation Needs: Not on file  ?Physical Activity: Not on file  ?Stress: Not on file  ?Social Connections: Not on file  ?Intimate Partner Violence: Not on file  ? ?Review of Systems ?Eating well ?Sleeps okay---if not coughing (using dayquil, nyquil, robitussin) ?Mildly depressed about having had a stroke---"I just want to get back to my routine" ?Now having some pain in left hip--no injury. Makes her limp some ? ?   ?Objective:  ? Physical Exam ?Constitutional:   ?   Appearance: Normal appearance.  ?Cardiovascular:  ?   Rate and Rhythm: Normal rate and regular rhythm.  ?   Heart sounds: No murmur heard. ?  No gallop.  ?Pulmonary:  ?   Effort: Pulmonary effort is normal.  ?   Breath sounds: Normal breath sounds. No wheezing or rales.  ?Musculoskeletal:  ?   Cervical back: Neck supple.  ?   Right lower leg: No edema.  ?   Left lower leg: No edema.  ?Lymphadenopathy:  ?   Cervical: No cervical adenopathy.  ?Skin: ?   Findings: No rash.  ?Neurological:  ?   Mental Status: She is alert and oriented to person, place, and time.  ?   Cranial Nerves: Cranial nerves 2-12 are intact.  ?   Motor: No weakness, tremor, atrophy or abnormal muscle tone.  ?   Coordination: Romberg sign negative. Coordination normal.  ?   Gait: Gait is intact.  ?  ? ? ? ? ?   ?Assessment & Plan:  ? ?

## 2021-08-03 NOTE — Consult Note (Addendum)
? ? ? ? ?[facility] ?[facility] ? ?Stat Consult ? ?Patient Name:   Chevette, Fee ?Date of Birth:   28-May-1936 ?Identification Number:   MRN - [id number] ?Date of Service:   08/03/2021 23:23:02 ? ?Diagnosis: ?      R20.2 - Paresthesia of skin ? ?Impression ?Patient is an 86 yo female with a PMH of recent punctate left frontal stroke one week prior (not c/w presenting symptoms of intermittent LUE numbness), chronic intracranial microhemorrhages concerning for underlying cerebral amyloid angiopathy, paroxysmal SVT, hypothyroidsim, HLD, OA who p/w recurrent transient LUE and the LLE numbness. Now resolved. Patient feels she is back to normal. On exam NIHSS of 1, subtle facial asymmetry. [place] reviewed. NAF noted. Atypical case, her initial presenting left UE symptoms did not correlate with punctate left frontal infarct noted on MRI last week. The appearance and location of her stroke is concerning for possible cardioembolic etiology. Unclear if repeated ischemic events, vs alternative etiology.Thrombolytics not indicated with recent acute stroke and subjective resolution of deficits.  ? ?Our recommendations are outlined below. ? ?Diagnostic Studies: ?MRI Head without contrast ?Please order ? ?Laboratory Studies: ?Recommend Lipid panel ?Hemoglobin A1c ? ?Antithrombotic Medications: ?Permissive hypertension, Antihypertensives with prn for first 24-48 hrs post stroke onset. If BP greater than 220/120 give Labetalol IV or Vasotec IV ?Statins for LDL goal less than 70 ? ?Additional Diagnostic Studies: ?MRI cervical spine ? ?Additional Antithrombotic Medications: ?Continue ASA 81mg  daily, Plavix 75mg  daily ? ?Nursing Recommendations: ?Telemetry, IV Fluids, avoid dextrose containing fluids, Maintain euglycemia ?Neuro checks q4 hrs x 24 hrs and then per shift ?Head of bed 30 degrees ? ?Consultations: ?Recommend Speech therapy if failed dysphagia screen ?Physical therapy/Occupational  therapy ? ?DVT Prophylaxis: ?Choice of Primary Team ? ?Disposition: ?Neurology will follow ? ? ?Case d/w ED MD ?Rec adission for recurrent possible TIA, stroke ? ?Rec repeat MRI brain w/o, and additional MRI cervical spine w/o ? ?Continue DAPT with ASA, plavix at this time (rec short term for 21-30 days), possible underlying CAA based on prior MRI ?Patient has a holter monitor which arrived in the mail today, will place once discharged.  ? ?If MRI brain shows acute infarcts, would consider TEE in addition to event monitor. ? ? ? ?---------------------------------------------------------------------------------------------------- ?Advanced Imaging: ?Advanced Imaging Not Completed because: ? ?Recent MRA reviewed, current exam not c/w acute LVO ? ? ? ?Metrics: ?[facility] Notification Time: 08/03/2021 23:19:33 ?Stamp Time: 08/03/2021 23:23:02 ?Callback Response Time: 08/03/2021 23:24:17 ? ? ?CT HEAD: ?As Per Radiologist CT Head Showed No Acute Hemorrhage or Acute Core Infarct ?Reviewed ? ? ? ?---------------------------------------------------------------------------------------------------- ? ?Chief Complaint: ?recurrent left sided numbness ? ?History of Present Illness: ?Patient is a 86 year old Female. ?Patient is an 86 yo female with a PMH of recent punctate left frontal stroke one week prior (not c/w presenting symptoms of intermittent LUE numbness), chronic intracranial microhemorrhages concerning for underlying cerberal amyloid angiopathy, paroxysmal SVT, hypothyroidsim, HLD, OA who p/w recurrent transient LUE and the LLE numbness. Now resolved. Patient feels she is back to normal. ?Patient initially presented to the ED on 2/24 with episodes of left arm numbness. No right sided symptoms reported during that admission. MRI brain images and report reviewed, significant for punctate left medial superior frontal infarct. IN addition, scattered foci of chronic microhemorrhages. Patent intracranial vessels on MRA  head. Mild plaque at the origind of the bilatera ICAs without stenosis on MRA neck. ECHO was done, EF 60-65%. No LAE. She reports she had not had recurrent symptoms  until this evening. She was on the computer when she noticed her left hand going numb, spreading up the arm. Involving the ulnar aspect of the hand. This lasted several minutes, 1-2. She felt otherwise normal. No headache, neck pain, speech or vision changes. No weakness or pain. Then later this evening after she got dressed for bed she developed numbness of the left leg. This is new ofr her. This also lasted for 1-2 minutes. Resolving spontaneous. No recurrence since then. No spine pain or bowel or bladder changes. She feels back to baseline. She did have left hip pain on Wednesday, without weakness or numbness. No AMS, shaking, confusion. ? ?  ? ?Past Medical History: ?     Hypertension ?     Hyperlipidemia ?     Stroke ? ?Medications: ? ?No Anticoagulant use  ?Antiplatelet use: Yes DAPT since 07/28/21 ?Reviewed EMR for current medications ? ?Allergies:  ?Reviewed ? ?Social History: ?Patient Is: Married ?Drug Use: No ? ?Family History: ? ?There is no family history of premature cerebrovascular disease pertinent to this consultation ? ?ROS : ?14 Points Review of Systems was performed and was negative except mentioned in HPI. ? ?Past Surgical History: ?There Is No Surgical History Contributory To Today?s Visit ? ?  ?Examination: ?BP(100//90), Pulse(199), Blood Glucose(100) ?1A: Level of Consciousness - Alert; keenly responsive + 0 ?1B: Ask Month and Age - Both Questions Right + 0 ?1C: Blink Eyes & Squeeze Hands - Performs Both Tasks + 0 ?2: Test Horizontal Extraocular Movements - Normal + 0 ?3: Test Visual Fields - No Visual Loss + 0 ?4: Test Facial Palsy (Use Grimace if Obtunded) - Minor paralysis (flat nasolabial fold, smile asymmetry) + 1 ?5A: Test Left Arm Motor Drift - No Drift for 10 Seconds + 0 ?5B: Test Right Arm Motor Drift - No Drift for 10  Seconds + 0 ?6A: Test Left Leg Motor Drift - No Drift for 5 Seconds + 0 ?6B: Test Right Leg Motor Drift - No Drift for 5 Seconds + 0 ?7: Test Limb Ataxia (FNF/Heel-Shin) - No Ataxia + 0 ?8: Test Sensation - Normal; No sensory loss + 0 ?9: Test Language/Aphasia - Normal; No aphasia + 0 ?10: Test Dysarthria - Normal + 0 ?11: Test Extinction/Inattention - No abnormality + 0 ? ?NIHSS Score: 1 ? ? ? ? ?Patient / Family was informed the Neurology Consult would occur via TeleHealth consult by way of interactive audio and video telecommunications and consented to receiving care in this manner. ? ?Patient is being evaluated for possible acute neurologic impairment and high probability of imminent or life - threatening deterioration.I spent total of 35 minutes providing care to this patient, including time for face to face visit via telemedicine, review of medical records, imaging studies and discussion of findings with providers, the patient and / or family. ? ? ?Dr Ruffin Frederick ? ? ?[facility] ?[phone] ? ?Case [id number] ?  ?

## 2021-08-03 NOTE — ED Notes (Signed)
While getting patient up to the bathroom, she suddenly has onset of numbness of half her left hand. The numbness ended on her wrist. This was reported to the provider. ?

## 2021-08-03 NOTE — ED Triage Notes (Addendum)
Pt in via [facility] from [facility].  Reports sudden onset left arm numbness around 1930, happening again around 2030, but this time the left arm and left leg were numb.  States these episodes only last momentarily and then subside.  Declines any numbness of other complaints at this time.  Vitals WDL, pt A/Ox4, neurologically intact at this time. ?

## 2021-08-03 NOTE — Assessment & Plan Note (Signed)
Has the expected mood worsening after stroke ?No MDD ?Continues on the mirtazapine 45mg  ?

## 2021-08-04 ENCOUNTER — Emergency Department: Payer: Medicare Other

## 2021-08-04 DIAGNOSIS — I6389 Other cerebral infarction: Secondary | ICD-10-CM | POA: Diagnosis not present

## 2021-08-04 DIAGNOSIS — I639 Cerebral infarction, unspecified: Secondary | ICD-10-CM

## 2021-08-04 DIAGNOSIS — D649 Anemia, unspecified: Secondary | ICD-10-CM

## 2021-08-04 DIAGNOSIS — G459 Transient cerebral ischemic attack, unspecified: Secondary | ICD-10-CM | POA: Diagnosis not present

## 2021-08-04 DIAGNOSIS — M50223 Other cervical disc displacement at C6-C7 level: Secondary | ICD-10-CM | POA: Diagnosis not present

## 2021-08-04 DIAGNOSIS — M50221 Other cervical disc displacement at C4-C5 level: Secondary | ICD-10-CM | POA: Diagnosis not present

## 2021-08-04 LAB — IRON AND TIBC
Iron: 53 ug/dL (ref 28–170)
Saturation Ratios: 17 % (ref 10.4–31.8)
TIBC: 307 ug/dL (ref 250–450)
UIBC: 254 ug/dL

## 2021-08-04 LAB — URINE DRUG SCREEN, QUALITATIVE (ARMC ONLY)
Amphetamines, Ur Screen: NOT DETECTED
Barbiturates, Ur Screen: NOT DETECTED
Benzodiazepine, Ur Scrn: NOT DETECTED
Cannabinoid 50 Ng, Ur ~~LOC~~: NOT DETECTED
Cocaine Metabolite,Ur ~~LOC~~: NOT DETECTED
MDMA (Ecstasy)Ur Screen: NOT DETECTED
Methadone Scn, Ur: NOT DETECTED
Opiate, Ur Screen: NOT DETECTED
Phencyclidine (PCP) Ur S: NOT DETECTED
Tricyclic, Ur Screen: NOT DETECTED

## 2021-08-04 LAB — URINALYSIS, ROUTINE W REFLEX MICROSCOPIC
Bilirubin Urine: NEGATIVE
Glucose, UA: NEGATIVE mg/dL
Hgb urine dipstick: NEGATIVE
Ketones, ur: NEGATIVE mg/dL
Leukocytes,Ua: NEGATIVE
Nitrite: NEGATIVE
Protein, ur: NEGATIVE mg/dL
Specific Gravity, Urine: 1.005 (ref 1.005–1.030)
pH: 7 (ref 5.0–8.0)

## 2021-08-04 LAB — RESP PANEL BY RT-PCR (FLU A&B, COVID) ARPGX2
Influenza A by PCR: NEGATIVE
Influenza B by PCR: NEGATIVE
SARS Coronavirus 2 by RT PCR: NEGATIVE

## 2021-08-04 LAB — RETICULOCYTES
Immature Retic Fract: 3.1 % (ref 2.3–15.9)
RBC.: 3.71 MIL/uL — ABNORMAL LOW (ref 3.87–5.11)
Retic Count, Absolute: 42.7 10*3/uL (ref 19.0–186.0)
Retic Ct Pct: 1.2 % (ref 0.4–3.1)

## 2021-08-04 LAB — FERRITIN: Ferritin: 102 ng/mL (ref 11–307)

## 2021-08-04 LAB — FOLATE: Folate: 17.4 ng/mL (ref >5.9)

## 2021-08-04 MED ORDER — SODIUM CHLORIDE 0.9 % IV SOLN: INTRAVENOUS | Status: DC

## 2021-08-04 MED ORDER — METOPROLOL SUCCINATE ER 50 MG PO TB24: 25.0000 mg | ORAL_TABLET | Freq: Every day | ORAL | Status: DC

## 2021-08-04 MED ORDER — ASPIRIN EC 81 MG PO TBEC: 81.0000 mg | DELAYED_RELEASE_TABLET | Freq: Every day | ORAL | Status: DC

## 2021-08-04 MED ORDER — STROKE: EARLY STAGES OF RECOVERY BOOK: Freq: Once | Status: DC

## 2021-08-04 MED ORDER — CLOPIDOGREL BISULFATE 75 MG PO TABS: 75.0000 mg | ORAL_TABLET | Freq: Every day | ORAL | 2 refills | Status: DC

## 2021-08-04 MED ORDER — ATORVASTATIN CALCIUM 20 MG PO TABS: 80.0000 mg | ORAL_TABLET | Freq: Every day | ORAL | Status: DC

## 2021-08-04 MED ORDER — CLOPIDOGREL BISULFATE 75 MG PO TABS: 75.0000 mg | ORAL_TABLET | Freq: Every day | ORAL | Status: DC

## 2021-08-04 MED ORDER — ASPIRIN EC 81 MG PO TBEC
81.0000 mg | DELAYED_RELEASE_TABLET | Freq: Every day | ORAL | Status: DC
Start: 2021-08-04 — End: 2021-10-02

## 2021-08-04 MED ORDER — ACETAMINOPHEN 160 MG/5ML PO SOLN
650.0000 mg | ORAL | Status: DC | PRN
Start: 2021-08-04 — End: 2021-08-04
  Filled 2021-08-04: qty 20.3

## 2021-08-04 MED ORDER — ACETAMINOPHEN 325 MG PO TABS: 650.0000 mg | ORAL_TABLET | ORAL | Status: DC | PRN

## 2021-08-04 MED ORDER — ACETAMINOPHEN 325 MG RE SUPP: 650.0000 mg | RECTAL | Status: DC | PRN

## 2021-08-04 MED ORDER — ENOXAPARIN SODIUM 40 MG/0.4ML IJ SOSY: 40.0000 mg | PREFILLED_SYRINGE | INTRAMUSCULAR | Status: DC

## 2021-08-04 NOTE — Assessment & Plan Note (Signed)
Continue Toprol 50 ?

## 2021-08-04 NOTE — Assessment & Plan Note (Signed)
Continue levothyroxine 

## 2021-08-04 NOTE — ED Notes (Signed)
Patient has returned from MRI. Patient placed back on monitoring equipment. Patient verbalizes no needs or complaints at this time. ?

## 2021-08-04 NOTE — Assessment & Plan Note (Signed)
Permissive hypertension,  ?Antihypertensives with prn for first 24-48 hrs post stroke onset. If BP greater than 220/120 give Labetalol IV or Vasotec IV ?Statins for LDL goal less than 70 ?Continue ASA '81mg'$  daily, Plavix '75mg'$  daily ?Telemetry, IV Fluids, avoid dextrose containing fluids, Maintain euglycemia ?Neuro checks q4 hrs x 24 hrs and then per shift ?Head of bed 30 degrees ?Physical therapy/Occupational therapy/Speech therapy if failed dysphagia screen ? ?

## 2021-08-04 NOTE — ED Notes (Signed)
Patient called out that she needed to go to the bathroom again. Staff assisted her with disconnecting from monitoring equipment and she ambulated from the bed to the toilet.  ?

## 2021-08-04 NOTE — ED Notes (Signed)
Patient has asked that I call her husband at 0630 to update him and ask him not to come due to his inability to walk without a walker. ?

## 2021-08-04 NOTE — Discharge Summary (Signed)
Physician Discharge Summary   Robin Logan  female DOB: 1936-03-04  NLG:[id number]  PCP: Venia Carbon, MD  Admit date: 08/03/2021 Discharge date: 08/04/2021  Admitted From: home Disposition:  home CODE STATUS: Full code  Discharge Instructions     Discharge instructions   Complete by: As directed    Per neurology, no new strokes.  Please continue management formulated during recent hospitalization.  Your left arm and leg numbness could be from your spinal stenosis.  Please continue outpatient neurology followup with Dr. Manuella Ghazi.  Please apply your cardiac monitor after you return home.   Dr. Enzo Bi [facility] Course:  For full details, please see H&P, progress notes, consult notes and ancillary notes.  Briefly,  Robin Logan is a 86 y.o. female with medical history significant of COPD, hypothyroidism, paroxysmal SVT on metoprolol, HLD, OA, multiple prior TIAs, recently hospitalized on 2/24 to 2/25 for recent punctate left frontal stroke one week prior (not c/w presenting symptoms of intermittent LUE numbness), chronic intracranial microhemorrhages concerning for underlying cerebral amyloid angiopathy, who returned to the ED with a complaint of 3 episodes of left-sided numbness, with the first 2 episodes involving the left upper extremity and the third involving both the upper and lower left extremities.  The episodes lasted 3 to 5 minutes and resolved on their own.  By arrival she was at baseline.  # Ruled out new stroke --per neuro review of MRI brain during current admission, "faint restricted diffusion at the cervical medullary junction which on further review of the MRI brain 2/24 may have been there previously and can be accounted for by difference in cuts." --Per neuro, Recommend no additional neurologic workup as an inpatient as pt is already optimized from a stroke standpoint. No change to recommendations from neurology on 2/24, which are -  atorvastatin '80mg'$  daily - ASA '81mg'$  daily + plavix '75mg'$  daily x21 days f/b plavix '75mg'$  daily monotherapy after that - recommend prolonged cardiac monitoring.  Pt had just received the device but yet to put it on.  Pt will start wearing the cardiac monitor after discharge. - f/u with outpatient neurology with Dr. Manuella Ghazi.  # LUE and LLE numbness --intermittent and brief.  MRI cervical spine showed Mild spinal canal stenosis and moderate left neural foraminal stenosis at C5-6, and Moderate right C3-4 neural foraminal stenosis, which could explain LUE numbness.  Pt also reported known lumbar spine issues. - f/u with outpatient neurology with Dr. Manuella Ghazi.   Hyponatremia Appears chronic.   Hx of Paroxysmal SVT (supraventricular tachycardia) (HCC) Continue home Toprol 50   Anemia, ruled out --Hgb 11.4, was 12.2 a week ago.  Likely mild fluctuation.   Hypothyroidism Continue levothyroxine   Discharge Diagnoses:  Principal Problem:   Recurrent cerebrovascular accidents (CVAs) ([place]) Active Problems:   Paroxysmal SVT (supraventricular tachycardia) (HCC)   Hyponatremia   Anemia   Hypothyroidism     Discharge Instructions:  Allergies as of 08/04/2021   No Known Allergies      Medication List     TAKE these medications    aspirin EC 81 MG tablet Take 1 tablet (81 mg total) by mouth daily. Continue on aspirin and plavix x 3 weeks and then stop aspirin and just continue plavix   atorvastatin 80 MG tablet Commonly known as: Lipitor Take 1 tablet (80 mg total) by mouth daily.   cholecalciferol 1000 units tablet Commonly known as: VITAMIN D Take 1,000 Units  by mouth daily.   clopidogrel 75 MG tablet Commonly known as: PLAVIX Take 1 tablet (75 mg total) by mouth daily.   ICAPS AREDS 2 PO Take by mouth.   levothyroxine 88 MCG tablet Commonly known as: SYNTHROID TAKE 1 TABLET DAILY   metoprolol succinate 50 MG 24 hr tablet Commonly known as: TOPROL-XL Take 0.5 tablets (25 mg  total) by mouth daily. Take with or immediately following a meal.   mirabegron ER 50 MG Tb24 tablet Commonly known as: MYRBETRIQ Take 1 tablet (50 mg total) by mouth daily.   mirtazapine 45 MG tablet Commonly known as: REMERON Take 1 tablet (45 mg total) by mouth at bedtime.   senna-docusate 8.6-50 MG tablet Commonly known as: Senokot-S Take 2-4 tablets by mouth 2 (two) times daily.   vitamin C 1000 MG tablet Take 1,000 mg by mouth daily.         Follow-up Information     Viviana Simpler I, MD Follow up in 1 week(s).   Specialties: Internal Medicine, Pediatrics Contact information: [place] [address] [phone]         Vladimir Crofts, MD Follow up in 1 week(s).   Specialty: Neurology Contact information: [facility] [place] [address] [phone]                 No Known Allergies   The results of significant diagnostics from this hospitalization (including imaging, microbiology, ancillary and laboratory) are listed below for reference.   Consultations:   Procedures/Studies: CT HEAD WO CONTRAST  Result Date: 08/03/2021 CLINICAL DATA:  Left arm numbness for several hours EXAM: CT HEAD WITHOUT CONTRAST TECHNIQUE: Contiguous axial images were obtained from the base of the skull through the vertex without intravenous contrast. RADIATION DOSE REDUCTION: This exam was performed according to the departmental dose-optimization program which includes automated exposure control, adjustment of the mA and/or kV according to patient size and/or use of iterative reconstruction technique. COMPARISON:  07/27/2021 FINDINGS: Brain: No evidence of acute infarction, hemorrhage, hydrocephalus, extra-axial collection or mass lesion/mass effect. Mild atrophic changes are again seen and stable. Vascular: No hyperdense vessel or unexpected calcification. Skull: Normal. Negative for fracture or focal lesion.  Sinuses/Orbits: No acute finding. Other: None. IMPRESSION: Mild atrophic changes without acute abnormality. Electronically Signed   By: Inez Catalina M.D.   On: 08/03/2021 22:10   CT HEAD WO CONTRAST  Result Date: 07/27/2021 CLINICAL DATA:  Stroke.  Follow-up.  Numbness in left arm. EXAM: CT HEAD WITHOUT CONTRAST TECHNIQUE: Contiguous axial images were obtained from the base of the skull through the vertex without intravenous contrast. RADIATION DOSE REDUCTION: This exam was performed according to the departmental dose-optimization program which includes automated exposure control, adjustment of the mA and/or kV according to patient size and/or use of iterative reconstruction technique. COMPARISON:  CT brain 07/26/2020 FINDINGS: Brain: There is mild cortical atrophy, within normal limits for patient age. The ventricles are normal in configuration. The basilar cisterns are patent. No mass, mass effect, or midline shift. No acute intracranial hemorrhage is seen. No abnormal extra-axial fluid collection. Preservation of the normal cortical gray-white interface without CT evidence of an acute major vascular territorial cortical based infarction. Vascular: No hyperdense vessel or unexpected calcification. Skull: Normal. Negative for fracture or focal lesion. Sinuses/Orbits: The visualized orbits are unremarkable. The visualized paranasal sinuses and mastoid air cells are clear. Other: None. IMPRESSION:: IMPRESSION: 1. No significant change from prior. 2. No acute intracranial  process. Electronically Signed   By: Yvonne Kendall M.D.   On: 07/27/2021 13:59   MR ANGIO HEAD WO CONTRAST  Result Date: 07/27/2021 CLINICAL DATA:  Stroke/TIA, determine embolic source; Transient ischemic attack (TIA) EXAM: MRI HEAD WITHOUT CONTRAST MRA HEAD WITHOUT CONTRAST MRA NECK WITHOUT AND WITH CONTRAST TECHNIQUE: Multiplanar, multi-echo pulse sequences of the brain and surrounding structures were acquired without intravenous contrast.  Angiographic images of the Circle of Willis were acquired using MRA technique without intravenous contrast. Angiographic images of the neck were acquired using MRA technique without and with intravenous contrast. Carotid stenosis measurements (when applicable) are obtained utilizing NASCET criteria, using the distal internal carotid diameter as the denominator. CONTRAST:  4m GADAVIST GADOBUTROL 1 MMOL/ML IV SOLN COMPARISON:  June 2021 FINDINGS: MRI HEAD Brain: Punctate focus of mildly reduced diffusion involving the medial cortex of the left superior frontal gyrus (series 5, image 37). Patchy and confluent areas of T2 hyperintensity in the supratentorial white matter are nonspecific but may reflect stable chronic microvascular ischemic changes. Ventricles and sulci are stable and within normal limits in size and configuration. Scattered small foci of susceptibility in the cerebral subcortical white matter likely reflect chronic microhemorrhages. There is no intracranial mass or mass effect. There is no hydrocephalus or extra-axial fluid collection. Vascular: Major vessel flow voids at the skull base are preserved. Skull and upper cervical spine: Normal marrow signal is preserved. Sinuses/Orbits: Bilateral lens replacements. Orbits are unremarkable. Other: Sella is unremarkable.  Mastoid air cells are clear. MRA HEAD Intracranial internal carotid arteries are patent. Middle and anterior cerebral arteries are patent. Intracranial vertebral arteries, basilar artery, posterior cerebral arteries are patent. There is no significant stenosis or aneurysm. MRA NECK Great vessel origins are patent. Common, internal, and external carotid arteries are patent. Mild plaque is present at the ICA origins without stenosis. Vertebral arteries are patent and codominant. No stenosis. IMPRESSION: Punctate left frontal acute cortical infarct. Stable chronic microvascular ischemic changes. Stable chronic microhemorrhages in a pattern  suggesting amyloid angiopathy. No large vessel occlusion or hemodynamically significant stenosis. Electronically Signed   By: PMacy MisM.D.   On: 07/27/2021 16:23   MR ANGIO NECK W WO CONTRAST  Result Date: 07/27/2021 CLINICAL DATA:  Stroke/TIA, determine embolic source; Transient ischemic attack (TIA) EXAM: MRI HEAD WITHOUT CONTRAST MRA HEAD WITHOUT CONTRAST MRA NECK WITHOUT AND WITH CONTRAST TECHNIQUE: Multiplanar, multi-echo pulse sequences of the brain and surrounding structures were acquired without intravenous contrast. Angiographic images of the Circle of Willis were acquired using MRA technique without intravenous contrast. Angiographic images of the neck were acquired using MRA technique without and with intravenous contrast. Carotid stenosis measurements (when applicable) are obtained utilizing NASCET criteria, using the distal internal carotid diameter as the denominator. CONTRAST:  515mGADAVIST GADOBUTROL 1 MMOL/ML IV SOLN COMPARISON:  June 2021 FINDINGS: MRI HEAD Brain: Punctate focus of mildly reduced diffusion involving the medial cortex of the left superior frontal gyrus (series 5, image 37). Patchy and confluent areas of T2 hyperintensity in the supratentorial white matter are nonspecific but may reflect stable chronic microvascular ischemic changes. Ventricles and sulci are stable and within normal limits in size and configuration. Scattered small foci of susceptibility in the cerebral subcortical white matter likely reflect chronic microhemorrhages. There is no intracranial mass or mass effect. There is no hydrocephalus or extra-axial fluid collection. Vascular: Major vessel flow voids at the skull base are preserved. Skull and upper cervical spine: Normal marrow signal is preserved. Sinuses/Orbits: Bilateral lens replacements. Orbits  are unremarkable. Other: Sella is unremarkable.  Mastoid air cells are clear. MRA HEAD Intracranial internal carotid arteries are patent. Middle and  anterior cerebral arteries are patent. Intracranial vertebral arteries, basilar artery, posterior cerebral arteries are patent. There is no significant stenosis or aneurysm. MRA NECK Great vessel origins are patent. Common, internal, and external carotid arteries are patent. Mild plaque is present at the ICA origins without stenosis. Vertebral arteries are patent and codominant. No stenosis. IMPRESSION: Punctate left frontal acute cortical infarct. Stable chronic microvascular ischemic changes. Stable chronic microhemorrhages in a pattern suggesting amyloid angiopathy. No large vessel occlusion or hemodynamically significant stenosis. Electronically Signed   By: Macy Mis M.D.   On: 07/27/2021 16:23   MR BRAIN WO CONTRAST  Result Date: 08/04/2021 CLINICAL DATA:  Transient ischemic attack EXAM: MRI HEAD WITHOUT CONTRAST MRI CERVICAL SPINE WITHOUT CONTRAST TECHNIQUE: Multiplanar, multiecho pulse sequences of the brain and surrounding structures, and cervical spine, to include the craniocervical junction and cervicothoracic junction, were obtained without intravenous contrast. COMPARISON:  None. FINDINGS: MRI HEAD FINDINGS Brain: Faint area of diffusion restriction in the dorsolateral aspect of the cervicomedullary junction. Fewer than 5 scattered microhemorrhages in a nonspecific pattern. There is multifocal hyperintense T2-weighted signal within the white matter. Generalized volume loss without a clear lobar predilection. The midline structures are normal. Vascular: Major flow voids are preserved. Skull and upper cervical spine: Normal calvarium and skull base. Visualized upper cervical spine and soft tissues are normal. Sinuses/Orbits:No paranasal sinus fluid levels or advanced mucosal thickening. No mastoid or middle ear effusion. Normal orbits. MRI CERVICAL SPINE FINDINGS Alignment: Grade 1 retrolisthesis at C3-4 and C5-6. Grade 1 anterolisthesis at C4-5. Vertebrae: No fracture, evidence of discitis, or  bone lesion. Cord: Normal signal and morphology. Posterior Fossa, vertebral arteries, paraspinal tissues: Negative. Disc levels: C1-2: Unremarkable. C2-3: Normal disc space and facet joints. There is no spinal canal stenosis. No neural foraminal stenosis. C3-4: Moderate facet hypertrophy. Medium-sized disc osteophyte complex. There is no spinal canal stenosis. Moderate right neural foraminal stenosis. C4-5: Disc bulge with endplate spurring. There is no spinal canal stenosis. No neural foraminal stenosis. C5-6: Medium-sized disc osteophyte complex. Mild spinal canal stenosis. Moderate left neural foraminal stenosis. C6-7: Small disc bulge. There is no spinal canal stenosis. No neural foraminal stenosis. C7-T1: Normal disc space and facet joints. There is no spinal canal stenosis. No neural foraminal stenosis. IMPRESSION: 1. Faint area of diffusion restriction in the dorsolateral aspect of the cervicomedullary junction may indicate a small acute or subacute infarct. 2. Scattered chronic microhemorrhages in a nonspecific pattern. 3. Mild spinal canal stenosis and moderate left neural foraminal stenosis at C5-6. 4. Moderate right C3-4 neural foraminal stenosis. Electronically Signed   By: Ulyses Jarred M.D.   On: 08/04/2021 01:37   MR BRAIN WO CONTRAST  Result Date: 07/27/2021 CLINICAL DATA:  Stroke/TIA, determine embolic source; Transient ischemic attack (TIA) EXAM: MRI HEAD WITHOUT CONTRAST MRA HEAD WITHOUT CONTRAST MRA NECK WITHOUT AND WITH CONTRAST TECHNIQUE: Multiplanar, multi-echo pulse sequences of the brain and surrounding structures were acquired without intravenous contrast. Angiographic images of the Circle of Willis were acquired using MRA technique without intravenous contrast. Angiographic images of the neck were acquired using MRA technique without and with intravenous contrast. Carotid stenosis measurements (when applicable) are obtained utilizing NASCET criteria, using the distal internal carotid  diameter as the denominator. CONTRAST:  62m GADAVIST GADOBUTROL 1 MMOL/ML IV SOLN COMPARISON:  June 2021 FINDINGS: MRI HEAD Brain: Punctate focus of mildly reduced diffusion involving  the medial cortex of the left superior frontal gyrus (series 5, image 37). Patchy and confluent areas of T2 hyperintensity in the supratentorial white matter are nonspecific but may reflect stable chronic microvascular ischemic changes. Ventricles and sulci are stable and within normal limits in size and configuration. Scattered small foci of susceptibility in the cerebral subcortical white matter likely reflect chronic microhemorrhages. There is no intracranial mass or mass effect. There is no hydrocephalus or extra-axial fluid collection. Vascular: Major vessel flow voids at the skull base are preserved. Skull and upper cervical spine: Normal marrow signal is preserved. Sinuses/Orbits: Bilateral lens replacements. Orbits are unremarkable. Other: Sella is unremarkable.  Mastoid air cells are clear. MRA HEAD Intracranial internal carotid arteries are patent. Middle and anterior cerebral arteries are patent. Intracranial vertebral arteries, basilar artery, posterior cerebral arteries are patent. There is no significant stenosis or aneurysm. MRA NECK Great vessel origins are patent. Common, internal, and external carotid arteries are patent. Mild plaque is present at the ICA origins without stenosis. Vertebral arteries are patent and codominant. No stenosis. IMPRESSION: Punctate left frontal acute cortical infarct. Stable chronic microvascular ischemic changes. Stable chronic microhemorrhages in a pattern suggesting amyloid angiopathy. No large vessel occlusion or hemodynamically significant stenosis. Electronically Signed   By: Macy Mis M.D.   On: 07/27/2021 16:23   MR Cervical Spine Wo Contrast  Result Date: 08/04/2021 CLINICAL DATA:  Transient ischemic attack EXAM: MRI HEAD WITHOUT CONTRAST MRI CERVICAL SPINE WITHOUT CONTRAST  TECHNIQUE: Multiplanar, multiecho pulse sequences of the brain and surrounding structures, and cervical spine, to include the craniocervical junction and cervicothoracic junction, were obtained without intravenous contrast. COMPARISON:  None. FINDINGS: MRI HEAD FINDINGS Brain: Faint area of diffusion restriction in the dorsolateral aspect of the cervicomedullary junction. Fewer than 5 scattered microhemorrhages in a nonspecific pattern. There is multifocal hyperintense T2-weighted signal within the white matter. Generalized volume loss without a clear lobar predilection. The midline structures are normal. Vascular: Major flow voids are preserved. Skull and upper cervical spine: Normal calvarium and skull base. Visualized upper cervical spine and soft tissues are normal. Sinuses/Orbits:No paranasal sinus fluid levels or advanced mucosal thickening. No mastoid or middle ear effusion. Normal orbits. MRI CERVICAL SPINE FINDINGS Alignment: Grade 1 retrolisthesis at C3-4 and C5-6. Grade 1 anterolisthesis at C4-5. Vertebrae: No fracture, evidence of discitis, or bone lesion. Cord: Normal signal and morphology. Posterior Fossa, vertebral arteries, paraspinal tissues: Negative. Disc levels: C1-2: Unremarkable. C2-3: Normal disc space and facet joints. There is no spinal canal stenosis. No neural foraminal stenosis. C3-4: Moderate facet hypertrophy. Medium-sized disc osteophyte complex. There is no spinal canal stenosis. Moderate right neural foraminal stenosis. C4-5: Disc bulge with endplate spurring. There is no spinal canal stenosis. No neural foraminal stenosis. C5-6: Medium-sized disc osteophyte complex. Mild spinal canal stenosis. Moderate left neural foraminal stenosis. C6-7: Small disc bulge. There is no spinal canal stenosis. No neural foraminal stenosis. C7-T1: Normal disc space and facet joints. There is no spinal canal stenosis. No neural foraminal stenosis. IMPRESSION: 1. Faint area of diffusion restriction in  the dorsolateral aspect of the cervicomedullary junction may indicate a small acute or subacute infarct. 2. Scattered chronic microhemorrhages in a nonspecific pattern. 3. Mild spinal canal stenosis and moderate left neural foraminal stenosis at C5-6. 4. Moderate right C3-4 neural foraminal stenosis. Electronically Signed   By: Ulyses Jarred M.D.   On: 08/04/2021 01:37   DG Chest Port 1 View  Result Date: 07/27/2021 CLINICAL DATA:  Productive cough for 1 week. EXAM:  PORTABLE CHEST 1 VIEW COMPARISON:  03/05/2019 FINDINGS: The heart size and mediastinal contours are within normal limits. Both lungs are clear. Thoracolumbar scoliosis again noted. IMPRESSION: No active disease. Electronically Signed   By: Marlaine Hind M.D.   On: 07/27/2021 17:35   ECHOCARDIOGRAM COMPLETE  Result Date: 07/28/2021    ECHOCARDIOGRAM REPORT   Patient Name:   Robin Logan Date of Exam: 07/28/2021 Medical Rec #:  [id number]        Height:       64.0 in Accession #:    [id number]       Weight:       130.0 lb Date of Birth:  02/02/1936        BSA:          1.629 m Patient Age:    70 years         BP:           150/77 mmHg Patient Gender: F                HR:           87 bpm. Exam Location:  [facility] Procedure: 2D Echo Indications:     Stroke I63.9  History:         Patient has prior history of Echocardiogram examinations, most                  recent 11/09/2019.  Sonographer:     Kathlen Brunswick RDCS Referring Phys:  [place] Diagnosing Phys: Kate Sable MD IMPRESSIONS  1. Left ventricular ejection fraction, by estimation, is 60 to 65%. The left ventricle has normal function. The left ventricle has no regional wall motion abnormalities. Left ventricular diastolic parameters were normal.  2. Right ventricular systolic function is normal. The right ventricular size is normal.  3. The mitral valve is grossly normal. No evidence of mitral valve regurgitation.  4. The aortic valve was not well visualized. Aortic valve  regurgitation is not visualized.  5. The inferior vena cava is dilated in size with >50% respiratory variability, suggesting right atrial pressure of 8 mmHg. FINDINGS  Left Ventricle: Left ventricular ejection fraction, by estimation, is 60 to 65%. The left ventricle has normal function. The left ventricle has no regional wall motion abnormalities. The left ventricular internal cavity size was normal in size. There is  no left ventricular hypertrophy. Left ventricular diastolic parameters were normal. Right Ventricle: The right ventricular size is normal. No increase in right ventricular wall thickness. Right ventricular systolic function is normal. Left Atrium: Left atrial size was normal in size. Right Atrium: Right atrial size was normal in size. Pericardium: There is no evidence of pericardial effusion. Mitral Valve: The mitral valve is grossly normal. Mild mitral annular calcification. No evidence of mitral valve regurgitation. Tricuspid Valve: The tricuspid valve is normal in structure. Tricuspid valve regurgitation is mild. Aortic Valve: The aortic valve was not well visualized. Aortic valve regurgitation is not visualized. Aortic valve peak gradient measures 9.1 mmHg. Pulmonic Valve: The pulmonic valve was not well visualized. Pulmonic valve regurgitation is not visualized. Aorta: The aortic root is normal in size and structure. Venous: The inferior vena cava is dilated in size with greater than 50% respiratory variability, suggesting right atrial pressure of 8 mmHg. IAS/Shunts: No atrial level shunt detected by color flow Doppler.  LEFT VENTRICLE PLAX 2D LVIDd:         3.70 cm     Diastology LVIDs:  2.60 cm     LV e' medial:    7.29 cm/s LV PW:         0.90 cm     LV E/e' medial:  9.2 LV IVS:        0.90 cm     LV e' lateral:   9.14 cm/s LVOT diam:     1.80 cm     LV E/e' lateral: 7.4 LV SV:         46 LV SV Index:   28 LVOT Area:     2.54 cm  LV Volumes (MOD) LV vol d, MOD A4C: 64.6 ml LV vol s,  MOD A4C: 26.5 ml LV SV MOD A4C:     64.6 ml RIGHT VENTRICLE RV Basal diam:  2.50 cm RV S prime:     13.30 cm/s TAPSE (M-mode): 2.4 cm LEFT ATRIUM           Index       RIGHT ATRIUM           Index LA diam:      2.10 cm 1.29 cm/m  RA Area:     12.60 cm LA Vol (A2C): 12.2 ml 7.49 ml/m  RA Volume:   30.50 ml  18.72 ml/m LA Vol (A4C): 13.4 ml 8.23 ml/m  AORTIC VALVE                 PULMONIC VALVE AV Area (Vmax): 1.62 cm     PV Vmax:       1.03 m/s AV Vmax:        151.00 cm/s  PV Peak grad:  4.2 mmHg AV Peak Grad:   9.1 mmHg LVOT Vmax:      96.00 cm/s LVOT Vmean:     60.300 cm/s LVOT VTI:       0.179 m  AORTA Ao Root diam: 2.70 cm MITRAL VALVE               TRICUSPID VALVE MV Area (PHT): 4.36 cm    TV Peak grad:   25.1 mmHg MV Decel Time: 174 msec    TV Vmax:        2.51 m/s MV E velocity: 67.30 cm/s MV A velocity: 69.80 cm/s  SHUNTS MV E/A ratio:  0.96        Systemic VTI:  0.18 m                            Systemic Diam: 1.80 cm Kate Sable MD Electronically signed by Kate Sable MD Signature Date/Time: 07/28/2021/11:43:44 AM    Final       Labs: BNP (last 3 results) No results for input(s): BNP in the last 8760 hours. Basic Metabolic Panel: Recent Labs  Lab 08/03/21 2145  NA 128*  K 4.6  CL 93*  CO2 23  GLUCOSE 117*  BUN 23  CREATININE 0.84  CALCIUM 9.0   Liver Function Tests: Recent Labs  Lab 08/03/21 2145  AST 31  ALT 30  ALKPHOS 74  BILITOT 0.3  PROT 6.9  ALBUMIN 3.8   No results for input(s): LIPASE, AMYLASE in the last 168 hours. No results for input(s): AMMONIA in the last 168 hours. CBC: Recent Labs  Lab 08/03/21 2145  WBC 9.0  NEUTROABS 6.5  HGB 11.4*  HCT 34.3*  MCV 93.0  PLT 287   Cardiac Enzymes: No results for input(s): CKTOTAL, CKMB, CKMBINDEX, TROPONINI in the last 168 hours.  BNP: Invalid input(s): POCBNP CBG: Recent Labs  Lab 08/03/21 2143  GLUCAP 127*   D-Dimer No results for input(s): DDIMER in the last 72 hours. Hgb A1c No  results for input(s): HGBA1C in the last 72 hours. Lipid Profile No results for input(s): CHOL, HDL, LDLCALC, TRIG, CHOLHDL, LDLDIRECT in the last 72 hours. Thyroid function studies No results for input(s): TSH, T4TOTAL, T3FREE, THYROIDAB in the last 72 hours.  Invalid input(s): FREET3 Anemia work up Recent Labs    08/03/21 2145  FOLATE 17.4  FERRITIN 102  TIBC 307  IRON 53  RETICCTPCT 1.2   Urinalysis    Component Value Date/Time   COLORURINE STRAW (A) 08/04/2021 0127   APPEARANCEUR CLEAR (A) 08/04/2021 0127   LABSPEC 1.005 08/04/2021 0127   PHURINE 7.0 08/04/2021 0127   GLUCOSEU NEGATIVE 08/04/2021 0127   HGBUR NEGATIVE 08/04/2021 0127   BILIRUBINUR NEGATIVE 08/04/2021 0127   BILIRUBINUR Negative 08/12/2018 1402   KETONESUR NEGATIVE 08/04/2021 0127   PROTEINUR NEGATIVE 08/04/2021 0127   UROBILINOGEN 0.2 08/12/2018 1402   NITRITE NEGATIVE 08/04/2021 0127   LEUKOCYTESUR NEGATIVE 08/04/2021 0127   Sepsis Labs Invalid input(s): PROCALCITONIN,  WBC,  LACTICIDVEN Microbiology Recent Results (from the past 240 hour(s))  Resp Panel by RT-PCR (Flu A&B, Covid) Nasopharyngeal Swab     Status: None   Collection Time: 07/27/21  2:17 PM   Specimen: Nasopharyngeal Swab; Nasopharyngeal(NP) swabs in vial transport medium  Result Value Ref Range Status   SARS Coronavirus 2 by RT PCR NEGATIVE NEGATIVE Final    Comment: (NOTE) SARS-CoV-2 target nucleic acids are NOT DETECTED.  The SARS-CoV-2 RNA is generally detectable in upper respiratory specimens during the acute phase of infection. The lowest concentration of SARS-CoV-2 viral copies this assay can detect is 138 copies/mL. A negative result does not preclude SARS-Cov-2 infection and should not be used as the sole basis for treatment or other patient management decisions. A negative result may occur with  improper specimen collection/handling, submission of specimen other than nasopharyngeal swab, presence of viral mutation(s)  within the areas targeted by this assay, and inadequate number of viral copies(<138 copies/mL). A negative result must be combined with clinical observations, patient history, and epidemiological information. The expected result is Negative.  Fact Sheet for Patients:  EntrepreneurPulse.com.au  Fact Sheet for Healthcare Providers:  IncredibleEmployment.be  This test is no t yet approved or cleared by the [place] FDA and  has been authorized for detection and/or diagnosis of SARS-CoV-2 by FDA under an Emergency Use Authorization (EUA). This EUA will remain  in effect (meaning this test can be used) for the duration of the COVID-19 declaration under Section 564(b)(1) of the Act, 21 U.S.C.section 360bbb-3(b)(1), unless the authorization is terminated  or revoked sooner.       Influenza A by PCR NEGATIVE NEGATIVE Final   Influenza B by PCR NEGATIVE NEGATIVE Final    Comment: (NOTE) The Xpert Xpress SARS-CoV-2/FLU/RSV plus assay is intended as an aid in the diagnosis of influenza from Nasopharyngeal swab specimens and should not be used as a sole basis for treatment. Nasal washings and aspirates are unacceptable for Xpert Xpress SARS-CoV-2/FLU/RSV testing.  Fact Sheet for Patients: EntrepreneurPulse.com.au  Fact Sheet for Healthcare Providers: IncredibleEmployment.be  This test is not yet approved or cleared by the [place] FDA and has been authorized for detection and/or diagnosis of SARS-CoV-2 by FDA under an Emergency Use Authorization (EUA). This EUA will remain in effect (meaning this test can be used) for the duration  of the COVID-19 declaration under Section 564(b)(1) of the Act, 21 U.S.C. section 360bbb-3(b)(1), unless the authorization is terminated or revoked.  Performed at [facility], [address] [place], [place] [address]   Resp Panel by RT-PCR (Flu A&B, Covid)  Nasopharyngeal Swab     Status: None   Collection Time: 08/04/21  1:27 AM   Specimen: Nasopharyngeal Swab; Nasopharyngeal(NP) swabs in vial transport medium  Result Value Ref Range Status   SARS Coronavirus 2 by RT PCR NEGATIVE NEGATIVE Final    Comment: (NOTE) SARS-CoV-2 target nucleic acids are NOT DETECTED.  The SARS-CoV-2 RNA is generally detectable in upper respiratory specimens during the acute phase of infection. The lowest concentration of SARS-CoV-2 viral copies this assay can detect is 138 copies/mL. A negative result does not preclude SARS-Cov-2 infection and should not be used as the sole basis for treatment or other patient management decisions. A negative result may occur with  improper specimen collection/handling, submission of specimen other than nasopharyngeal swab, presence of viral mutation(s) within the areas targeted by this assay, and inadequate number of viral copies(<138 copies/mL). A negative result must be combined with clinical observations, patient history, and epidemiological information. The expected result is Negative.  Fact Sheet for Patients:  EntrepreneurPulse.com.au  Fact Sheet for Healthcare Providers:  IncredibleEmployment.be  This test is no t yet approved or cleared by the [place] FDA and  has been authorized for detection and/or diagnosis of SARS-CoV-2 by FDA under an Emergency Use Authorization (EUA). This EUA will remain  in effect (meaning this test can be used) for the duration of the COVID-19 declaration under Section 564(b)(1) of the Act, 21 U.S.C.section 360bbb-3(b)(1), unless the authorization is terminated  or revoked sooner.       Influenza A by PCR NEGATIVE NEGATIVE Final   Influenza B by PCR NEGATIVE NEGATIVE Final    Comment: (NOTE) The Xpert Xpress SARS-CoV-2/FLU/RSV plus assay is intended as an aid in the diagnosis of influenza from Nasopharyngeal swab specimens and should not be  used as a sole basis for treatment. Nasal washings and aspirates are unacceptable for Xpert Xpress SARS-CoV-2/FLU/RSV testing.  Fact Sheet for Patients: EntrepreneurPulse.com.au  Fact Sheet for Healthcare Providers: IncredibleEmployment.be  This test is not yet approved or cleared by the [place] FDA and has been authorized for detection and/or diagnosis of SARS-CoV-2 by FDA under an Emergency Use Authorization (EUA). This EUA will remain in effect (meaning this test can be used) for the duration of the COVID-19 declaration under Section 564(b)(1) of the Act, 21 U.S.C. section 360bbb-3(b)(1), unless the authorization is terminated or revoked.  Performed at [facility], [address] [place], [place] [address]      Total time spend on discharging this patient, including the last patient exam, discussing the hospital stay, instructions for ongoing care as it relates to all pertinent caregivers, as well as preparing the medical discharge records, prescriptions, and/or referrals as applicable, is 45 minutes.    Enzo Bi, MD  [facility] 08/04/2021, 9:06 AM

## 2021-08-04 NOTE — ED Notes (Signed)
Patient assisted up to the bathroom with minimal assistance. Patient provided with beverage, warm blanket and socks. Patient's shoes were removed and socks placed instead. Patient encouraged to get some additional rest. ?

## 2021-08-04 NOTE — Assessment & Plan Note (Signed)
Appears chronic. ?We will give a small NS bolus and monitor ?

## 2021-08-04 NOTE — Evaluation (Signed)
Physical Therapy Evaluation ?Patient Details ?Name: Robin Logan ?MRN: [id number] ?DOB: 1935/06/15 ?Today's Date: 08/04/2021 ? ?History of Present Illness ? Pt is an 86 y/o F who presented with c/o 2 episodes of L sided numbness & 1 episode of BUE numbness, each lasting 3-5 minutes & resolving on their own. MRI shows faint area of diffusion restriction in the dorsolateral aspect of  the cervicomedullary junction may indicate a small acute or subacute  infarct. Of note, pt was recently hospitalized 2/24-2/25 for punctate L frontal stroke 1 week prior, chronic intracranial microhemorrhages concerning for underlying cerebral amyloid angiopathy,  discharged on DAPT for 3 weeks then to continue with Plavix monotherapy with Zio patch mailed to the house. PMH: COPD, hypothyroidism, paroxysmal SVT, HLD, OA, multiple TIAs, depression, thyroid disease, arthritis  ?Clinical Impression ? Pt seen for PT evaluation with pt reporting she is ambulatory without AD, attending yoga 3 days/week prior to admission. Upon PT arrival pt c/o feeling tingling in LUE again with nurse & MD notified & nurse in room to assess pt. MD notified of no new changes reported by nurse & BP 131/70 mmHg MAP 88 & MD cleared pt for participate via secure chat. Pt completes bed mobility with mod I, sit<>stand & gait in hallway without AD independently. Pt does not demonstrate any overt LOB nor acute PT needs at this time. Pt is anxious re: going home with husband who doesn't mobilize well with PT & nurse providing encouragement. PT to sign off at this time.    ?   ? ?Recommendations for follow up therapy are one component of a multi-disciplinary discharge planning process, led by the attending physician.  Recommendations may be updated based on patient status, additional functional criteria and insurance authorization. ? ?Follow Up Recommendations No PT follow up ? ?  ?Assistance Recommended at Discharge None  ?Patient can return home with the following ?    ? ?  ?Equipment Recommendations None recommended by PT  ?Recommendations for Other Services ?    ?  ?Functional Status Assessment Patient has not had a recent decline in their functional status  ? ?  ?Precautions / Restrictions Precautions ?Precautions: None ?Restrictions ?Weight Bearing Restrictions: No  ? ?  ? ?Mobility ? Bed Mobility ?Overal bed mobility: Modified Independent ?  ?  ?  ?  ?  ?  ?General bed mobility comments: supine<>sit with HOB elevated ?  ? ?Transfers ?Overall transfer level: Independent ?Equipment used: None ?  ?  ?  ?  ?  ?  ?  ?General transfer comment: sit<>stand without AD ?  ? ?Ambulation/Gait ?Ambulation/Gait assistance: Independent ?Gait Distance (Feet): 200 Feet ?Assistive device: None ?Gait Pattern/deviations: WFL(Within Functional Limits) ?Gait velocity: slightly decreased ?  ?  ?  ? ?Stairs ?  ?  ?  ?  ?  ? ?Wheelchair Mobility ?  ? ?Modified Rankin (Stroke Patients Only) ?  ? ?  ? ?Balance Overall balance assessment: Independent ?  ?  ?  ?  ?  ?  ?  ?  ?  ?  ?  ?  ?  ?  ?  ?  ?  ?  ?   ? ? ? ?Pertinent Vitals/Pain Pain Assessment ?Pain Assessment: No/denies pain  ? ? ?Home Living Family/patient expects to be discharged to:: Private residence ?Living Arrangements: Spouse/significant other ?Available Help at Discharge: Family ?Type of Home: Independent living facility ?Home Access: Level entry ?  ?  ?  ?Home Layout: One level ?  ?  Additional Comments: Lives in the [facility] living [facility] at [facility].  ?  ?Prior Function Prior Level of Function : Independent/Modified Independent ?  ?  ?  ?  ?  ?  ?Mobility Comments: Does not use any DME. Does yoga 3x/wk. No falls history. ?  ?  ? ? ?Hand Dominance  ? Dominant Hand: Right ? ?  ?Extremity/Trunk Assessment  ? Upper Extremity Assessment ?Upper Extremity Assessment: Overall WFL for tasks assessed ?  ? ?Lower Extremity Assessment ?Lower Extremity Assessment: Overall WFL for tasks assessed ?  ? ?   ?Communication  ? Communication: No  difficulties  ?Cognition Arousal/Alertness: Awake/alert ?Behavior During Therapy: Anxious ?Overall Cognitive Status: Within Functional Limits for tasks assessed ?  ?  ?  ?  ?  ?  ?  ?  ?  ?  ?  ?  ?  ?  ?  ?  ?General Comments: Asking questions about LUE/LLE tingling with PT educating pt on what MD has said & encouraged pt to f/u with MD. ?  ?  ? ?  ?General Comments   ? ?  ?Exercises    ? ?Assessment/Plan  ?  ?PT Assessment Patient does not need any further PT services  ?PT Problem List   ? ?   ?  ?PT Treatment Interventions     ? ?PT Goals (Current goals can be found in the Care Plan section)  ?Acute Rehab PT Goals ?Patient Stated Goal: to know what's causing LUE/LLE tingling ?PT Goal Formulation: With patient ?Time For Goal Achievement: 08/18/21 ?Potential to Achieve Goals: Good ? ?  ?Frequency   ?  ? ? ?Co-evaluation   ?  ?  ?  ?  ? ? ?  ?AM-PAC PT "6 Clicks" Mobility  ?Outcome Measure Help needed turning from your back to your side while in a flat bed without using bedrails?: None ?Help needed moving from lying on your back to sitting on the side of a flat bed without using bedrails?: None ?Help needed moving to and from a bed to a chair (including a wheelchair)?: None ?Help needed standing up from a chair using your arms (e.g., wheelchair or bedside chair)?: None ?Help needed to walk in hospital room?: None ?Help needed climbing 3-5 steps with a railing? : None ?6 Click Score: 24 ? ?  ?End of Session   ?Activity Tolerance: Patient tolerated treatment well ?Patient left: in bed;with call bell/phone within reach ?Nurse Communication: Mobility status ?  ?  ? ?Time: 4656-8127 ?PT Time Calculation (min) (ACUTE ONLY): 18 min ? ? ?Charges:   PT Evaluation ?$PT Eval Low Complexity: 1 Low ?  ?  ?   ? ? ?Lavone Nian, PT, DPT ?08/04/21, 3:01 PM ? ? ?Waunita Schooner ?08/04/2021, 2:59 PM ? ?

## 2021-08-04 NOTE — ED Provider Notes (Signed)
[facility] Provider Note    Event Date/Time   First MD Initiated Contact with Patient 08/03/21 2255     (approximate)   History   Numbness   HPI  Robin Logan is a 86 y.o. female with history of a recent CVA on Plavix and aspirin, hyperlipidemia, hypothyroidism who presents for evaluation of left-sided numbness.  Patient was discharged from the hospital a week ago on Plavix and aspirin after having a small CVA.  Today she had 3 episodes of left-sided numbness.  2 of them involving the left upper extremity and one of them involving the left upper and left lower extremities.  All the episodes lasted 3 to 5 minutes and resolved without intervention.  She feels back to baseline at this time.  No chest pain or shortness of breath, no fever or chills, no headache.     Past Medical History:  Diagnosis Date   Arthritis    Depression    Hyperlipidemia    Osteopenia    Osteopenia    Sleep disorder    Thyroid disease     Past Surgical History:  Procedure Laterality Date   ABDOMINAL HYSTERECTOMY     APPENDECTOMY     CATARACT EXTRACTION W/PHACO Right 03/03/2019   Procedure: CATARACT EXTRACTION PHACO AND INTRAOCULAR LENS PLACEMENT (IOC) RIGHT  00:44.0  16.1%  7.12;  Surgeon: Leandrew Koyanagi, MD;  Location: [facility];  Service: Ophthalmology;  Laterality: Right;   CATARACT EXTRACTION W/PHACO Left 03/24/2019   Procedure: CATARACT EXTRACTION PHACO AND INTRAOCULAR LENS PLACEMENT (IOC) LEFT  01:09.4  12.5%  8.73;  Surgeon: Leandrew Koyanagi, MD;  Location: [place];  Service: Ophthalmology;  Laterality: Left;   TONSILLECTOMY AND ADENOIDECTOMY  1946     Physical Exam   Triage Vital Signs: ED Triage Vitals [08/03/21 2134]  Enc Vitals Group     BP 115/67     Pulse Rate 71     Resp 17     Temp      Temp Source Oral     SpO2 98 %     Weight 130 lb (59 kg)     Height '5\' 4"'$  (1.626 m)     Head Circumference      Peak Flow       Pain Score 0     Pain Loc      Pain Edu?      Excl. in [place]?     Most recent vital signs: Vitals:   08/03/21 2330 08/04/21 0000  BP: (!) 155/76 (!) 153/64  Pulse: 73 73  Resp: 13 16  SpO2: 100% 97%     Constitutional: Alert and oriented. Well appearing and in no apparent distress. HEENT:      Head: Normocephalic and atraumatic.         Eyes: Conjunctivae are normal. Sclera is non-icteric.       Mouth/Throat: Mucous membranes are moist.       Neck: Supple with no signs of meningismus. Cardiovascular: Regular rate and rhythm. No murmurs, gallops, or rubs. 2+ symmetrical distal pulses are present in all extremities.  Respiratory: Normal respiratory effort. Lungs are clear to auscultation bilaterally.  Gastrointestinal: Soft, non tender, and non distended with positive bowel sounds. No rebound or guarding. Genitourinary: No CVA tenderness. Musculoskeletal:  No edema, cyanosis, or erythema of extremities. Neurologic: A & O x3, PERRL, EOMI, no nystagmus, CN II-XII intact, motor testing reveals good tone and bulk throughout. There is no evidence of  pronator drift or dysmetria. Muscle strength is 5/5 throughout. Deep tendon reflexes are 2+ throughout with downgoing toes. Sensory examination is intact. Gait is normal. Skin: Skin is warm, dry and intact. No rash noted. Psychiatric: Mood and affect are normal. Speech and behavior are normal.  ED Results / Procedures / Treatments   Labs (all labs ordered are listed, but only abnormal results are displayed) Labs Reviewed  CBC - Abnormal; Notable for the following components:      Result Value   RBC 3.69 (*)    Hemoglobin 11.4 (*)    HCT 34.3 (*)    All other components within normal limits  COMPREHENSIVE METABOLIC PANEL - Abnormal; Notable for the following components:   Sodium 128 (*)    Chloride 93 (*)    Glucose, Bld 117 (*)    All other components within normal limits  CBG MONITORING, ED - Abnormal; Notable for the following  components:   Glucose-Capillary 127 (*)    All other components within normal limits  RESP PANEL BY RT-PCR (FLU A&B, COVID) ARPGX2  PROTIME-INR  APTT  DIFFERENTIAL  URINE DRUG SCREEN, QUALITATIVE ([facility] ONLY)  URINALYSIS, ROUTINE W REFLEX MICROSCOPIC     EKG  ED ECG REPORT I, Rudene Re, the attending physician, personally viewed and interpreted this ECG.  Normal sinus rhythm with a rate of 79, normal intervals, normal axis, no ST elevations or depressions.  RADIOLOGY I, Rudene Re, attending MD, have personally viewed and interpreted the images obtained during this visit as below:  CT negative for acute finding   ___________________________________________________ Interpretation by Radiologist:  CT HEAD WO CONTRAST  Result Date: 08/03/2021 CLINICAL DATA:  Left arm numbness for several hours EXAM: CT HEAD WITHOUT CONTRAST TECHNIQUE: Contiguous axial images were obtained from the base of the skull through the vertex without intravenous contrast. RADIATION DOSE REDUCTION: This exam was performed according to the departmental dose-optimization program which includes automated exposure control, adjustment of the mA and/or kV according to patient size and/or use of iterative reconstruction technique. COMPARISON:  07/27/2021 FINDINGS: Brain: No evidence of acute infarction, hemorrhage, hydrocephalus, extra-axial collection or mass lesion/mass effect. Mild atrophic changes are again seen and stable. Vascular: No hyperdense vessel or unexpected calcification. Skull: Normal. Negative for fracture or focal lesion. Sinuses/Orbits: No acute finding. Other: None. IMPRESSION: Mild atrophic changes without acute abnormality. Electronically Signed   By: Inez Catalina M.D.   On: 08/03/2021 22:10      PROCEDURES:  Critical Care performed: No  Procedures    IMPRESSION / MDM / ASSESSMENT AND PLAN / ED COURSE  I reviewed the triage vital signs and the nursing notes.  86 y.o. female  with history of a recent CVA on Plavix and aspirin, hyperlipidemia, hypothyroidism who presents for evaluation of short-lived intermittent episodes of left-sided numbness.  On exam is extremely well-appearing in no distress, slightly hypertensive with BP of 153/64.  Completely neurologically intact otherwise.  Ddx: TIA versus stroke versus intracranial bleed   Plan: CT head, EKG, cardiac telemetry for any signs of dysrhythmia, CBC, metabolic panel, CBG, coags   MEDICATIONS GIVEN IN ED: Medications - No data to display   ED COURSE: CT showing no acute findings.  Blood work with no signs of sepsis, significant anemia, dehydration, or significant electrolyte derangements.  She is hyponatremic to sodium of 128 but seems to be her baseline since February 2023.  Teleneurology was consulted and recommended repeat MRI of the head and MRI of the cervical spine.  Also  recommend admission to the hospital for further evaluation.  Hospitalist service was consulted and after discussion accepted patient to their service.  MRI is pending.  Per telemetry neurology recommended continuing aspirin and Plavix.  While I was updating patient in the room about these recommendations her rhythm on telemetry changed.  Her heart rate went up to 122 and she had a narrow regular complex tachycardia with no p waves.  The rhythm persisted for several minutes and resolved before I was able to get a repeat EKG.   Consults: Teleneurology and hospitalist   EMR reviewed including all the records from patient's admission last week for her CVA    FINAL CLINICAL IMPRESSION(S) / ED DIAGNOSES   Final diagnoses:  TIA (transient ischemic attack)     Rx / DC Orders   ED Discharge Orders     None        Note:  This document was prepared using Dragon voice recognition software and may include unintentional dictation errors.   Please note:  Patient was evaluated in Emergency Department today for the symptoms described in  the history of present illness. Patient was evaluated in the context of the global COVID-19 pandemic, which necessitated consideration that the patient might be at risk for infection with the SARS-CoV-2 virus that causes COVID-19. Institutional protocols and algorithms that pertain to the evaluation of patients at risk for COVID-19 are in a state of rapid change based on information released by regulatory bodies including the CDC and federal and state organizations. These policies and algorithms were followed during the patient's care in the ED.  Some ED evaluations and interventions may be delayed as a result of limited staffing during the pandemic.       Alfred Levins, [place], MD 08/04/21 [phone]

## 2021-08-04 NOTE — Progress Notes (Signed)
Neurology plan of care ? ?This is an 86 yo woman with pmhx recent punctate L frontal stroke one week prior, chronic intracranial microhemorrhages concerning for underlying cerebral amyloid angiopathy, paroxysmal SVT, hypothyroidsim, HLD, OA who p/w recurrent transient LUE and the LLE numbness. She was seen by teleneurology overnight by which point her sx had resolved. Her initial presentation last week was also LUE numbness and she was felt to likely have 2 infarcts at that time one of which was likely not fully captured on initial MRI. Repeat MRI brain overnight showed faint restricted diffusion at the cervical medullary junction which on further review of the MRI brain 2/24 may have been there previously and can be accounted for by difference in cuts. Regardless she is already optimized from a stroke standpoint. Recommend no additional neurologic workup as an inpatient as she is already optimized from a stroke standpoint. No change to recommendations from neurology on 2/24. MRI c spine did show show sx that could account for LLE numbness although she does have some neuroforaminal stenosis on the L which can be followed by outpatient neuro. ? ?Recommendations: ?- atorvastatin '80mg'$  daily ?- ASA '81mg'$  daily + plavix '75mg'$  daily x21 days f/b plavix '75mg'$  daily monotherapy after that ?- per 2/24 recommendations, recommend prolonged cardiac monitoring, consider loop recorder ?- f/u with outpatient neurology ? ?OK to be discharged from neuro standpoint. Neuro to sign off, but please re-engage if any additional neurologic concerns. ? ?Su Monks, MD ?[facility] ?[phone] ? ?If 7pm- 7am, please page neurology on call as listed in [place]. ? ? ?

## 2021-08-04 NOTE — ED Notes (Signed)
Pt. Called out for left arm tingling, PT and this RN at bedside. Dr. Billie Ruddy notified. Pt's neuro exam unremarkable. Pt. Expressing repeatedly she is nervous about something happening at home while taking care of her unwell husband. Pt. States she thinks her numbness may be coming from that. Dr. Billie Ruddy remarks numbness may be coming from her spinal stenosis.  Dr. Billie Ruddy informs PT to continue with assessment and therapy. Pt's VS WNL. ?

## 2021-08-04 NOTE — H&P (Signed)
History and Physical    Patient: Robin Logan XTK:[id number] DOB: [birth year] DOA: 08/03/2021 DOS: the patient was seen and examined on 08/04/2021 PCP: Venia Carbon, MD  Patient coming from: Home  Chief Complaint:  Chief Complaint  Patient presents with   Numbness    HPI: Robin Logan is a 86 y.o. female with medical history significant of COPD, hypothyroidism, paroxysmal SVT on metoprolol, HLD, OA, multiple prior TIAs, recently hospitalized on 2/24 to 2/25 for recent punctate left frontal stroke one week prior (not c/w presenting symptoms of intermittent LUE numbness), chronic intracranial microhemorrhages concerning for underlying cerebral amyloid angiopathy,  discharged on DAPT for 3 weeks then to continue with Plavix monotherapy with Zio patch mailed to the house at discharge who returns to the ED with a complaint of 3 episodes of left-sided numbness, with the first 2 episodes involving the left upper extremity and the third involving both the upper and lower left extremities.  The episodes lasted 3 to 5 minutes and resolved on their own.  By arrival she was at baseline.  She denies slurred speech, facial weakness numbness or tingling, headache or visual disturbance. NIHSS of 1. ED course: BP 150/76 with otherwise normal vitals.  Blood work significant for hyponatremia of 128 and anemia of 11.4, down from 12.2 on 2/24 but otherwise unremarkable EKG showed NSR at 73 however while in the ED she had a brief episode of SVT with rates in the 120s that resolved spontaneously.  It was not captured on EKG. CT head showed no acute intracranial findings. Teleneurology was consulted and recommended getting MRI head and C-spine following goals: Results 1. Faint area of diffusion restriction in the dorsolateral aspect of the cervicomedullary junction may indicate a small acute or subacute infarct. 2. Scattered chronic microhemorrhages in a nonspecific pattern. 3. Mild spinal canal stenosis and  moderate left neural foraminal stenosis at C5-6. 4. Moderate right C3-4 neural foraminal stenosis  Hospitalist consulted for admission.  Telemetry neurology recommendations noted    Review of Systems: As mentioned in the history of present illness. All other systems reviewed and are negative. Past Medical History:  Diagnosis Date   Arthritis    Depression    Hyperlipidemia    Osteopenia    Osteopenia    Sleep disorder    Thyroid disease    Past Surgical History:  Procedure Laterality Date   ABDOMINAL HYSTERECTOMY     APPENDECTOMY     CATARACT EXTRACTION W/PHACO Right 03/03/2019   Procedure: CATARACT EXTRACTION PHACO AND INTRAOCULAR LENS PLACEMENT (IOC) RIGHT  00:44.0  16.1%  7.12;  Surgeon: Leandrew Koyanagi, MD;  Location: [facility];  Service: Ophthalmology;  Laterality: Right;   CATARACT EXTRACTION W/PHACO Left 03/24/2019   Procedure: CATARACT EXTRACTION PHACO AND INTRAOCULAR LENS PLACEMENT (IOC) LEFT  01:09.4  12.5%  8.73;  Surgeon: Leandrew Koyanagi, MD;  Location: [facility];  Service: Ophthalmology;  Laterality: Left;   TONSILLECTOMY AND ADENOIDECTOMY  1946   Social History:  reports that she quit smoking about 43 years ago. Her smoking use included cigarettes. She has been exposed to tobacco smoke. She has never used smokeless tobacco. She reports current alcohol use. She reports that she does not use drugs.  No Known Allergies  Family History  Problem Relation Age of Onset   Hypertension Father    Stroke Brother    Diabetes Brother    Cancer Neg Hx        no breast or colon  Prior to Admission medications   Medication Sig Start Date End Date Taking? Authorizing Provider  Ascorbic Acid (VITAMIN C) 1000 MG tablet Take 1,000 mg by mouth daily.    [provider]  aspirin EC 81 MG tablet Take 1 tablet (81 mg total) by mouth daily. Continue on aspirin and plavix x 3 weeks and then stop aspirin and just continue plavix 07/28/21    Wyvonnia Dusky, MD  atorvastatin (LIPITOR) 80 MG tablet Take 1 tablet (80 mg total) by mouth daily. 07/28/21 08/27/21  Wyvonnia Dusky, MD  cholecalciferol (VITAMIN D) 1000 UNITS tablet Take 1,000 Units by mouth daily.    [provider]  clopidogrel (PLAVIX) 75 MG tablet Take 1 tablet (75 mg total) by mouth daily. 07/29/21 08/28/21  Wyvonnia Dusky, MD  levothyroxine (SYNTHROID) 88 MCG tablet TAKE 1 TABLET DAILY 03/26/21   Venia Carbon, MD  metoprolol succinate (TOPROL-XL) 50 MG 24 hr tablet Take 0.5 tablets (25 mg total) by mouth daily. Take with or immediately following a meal. 01/04/21 07/31/21  Kate Sable, MD  mirabegron ER (MYRBETRIQ) 50 MG TB24 tablet Take 1 tablet (50 mg total) by mouth daily. 05/29/21   Venia Carbon, MD  mirtazapine (REMERON) 45 MG tablet Take 1 tablet (45 mg total) by mouth at bedtime. 02/08/21   Venia Carbon, MD  Multiple Vitamins-Minerals (ICAPS AREDS 2 PO) Take by mouth.    [provider]  senna-docusate (SENOKOT-S) 8.6-50 MG per tablet Take 2-4 tablets by mouth 2 (two) times daily.    [provider]    Physical Exam: Vitals:   08/03/21 2230 08/03/21 2300 08/03/21 2330 08/04/21 0000  BP: (!) 145/80 (!) 117/53 (!) 155/76 (!) 153/64  Pulse: 73 69 73 73  Resp: '15 15 13 16  '$ TempSrc:      SpO2: 99% 97% 100% 97%  Weight:      Height:       Physical Exam Vitals and nursing note reviewed.  Constitutional:      General: She is not in acute distress.    Appearance: Normal appearance.  HENT:     Head: Normocephalic and atraumatic.  Cardiovascular:     Rate and Rhythm: Normal rate and regular rhythm.     Pulses: Normal pulses.     Heart sounds: Normal heart sounds. No murmur heard. Pulmonary:     Effort: Pulmonary effort is normal.     Breath sounds: Normal breath sounds. No wheezing or rhonchi.  Abdominal:     General: Bowel sounds are normal.     Palpations: Abdomen is soft.     Tenderness: There is no  abdominal tenderness.  Musculoskeletal:        General: No swelling or tenderness. Normal range of motion.     Cervical back: Normal range of motion and neck supple.  Skin:    General: Skin is warm and dry.  Neurological:     General: No focal deficit present.     Mental Status: She is alert and oriented to person, place, and time. Mental status is at baseline.  Psychiatric:        Mood and Affect: Mood normal.        Behavior: Behavior normal.     Data Reviewed: Relevant notes from primary care and specialist visits, past discharge summaries as available in EHR, including Care Everywhere. Prior diagnostic testing as pertinent to current admission diagnoses Updated medications and problem lists for reconciliation ED course, including vitals, labs,  imaging, treatment and response to treatment Triage notes, nursing and pharmacy notes and ED provider's notes Notable results as noted in HPI   Assessment and Plan: * Recurrent cerebrovascular accidents (CVAs) ([place]) Permissive hypertension,  Antihypertensives with prn for first 24-48 hrs post stroke onset. If BP greater than 220/120 give Labetalol IV or Vasotec IV Statins for LDL goal less than 70 Continue ASA '81mg'$  daily, Plavix '75mg'$  daily Telemetry, IV Fluids, avoid dextrose containing fluids, Maintain euglycemia Neuro checks q4 hrs x 24 hrs and then per shift Head of bed 30 degrees Physical therapy/Occupational therapy/Speech therapy if failed dysphagia screen   Hyponatremia Appears chronic. We will give a small NS bolus and monitor  Paroxysmal SVT (supraventricular tachycardia) (HCC) Continue Toprol 50  Anemia Slightly down from recent Anemia panel CBC Latest Ref Rng & Units 08/03/2021 07/27/2021 01/23/2021  WBC 4.0 - 10.5 K/uL 9.0 9.3 4.2  Hemoglobin 12.0 - 15.0 g/dL 11.4(L) 12.2 12.2  Hematocrit 36.0 - 46.0 % 34.3(L) 37.4 36.9  Platelets 150 - 400 K/uL 287 268 201.0     Hypothyroidism Continue  levothyroxine       Advance Care Planning:   Code Status: Prior   Consults: neurology, Dr. Quinn Axe  Family Communication: none  Severity of Illness: The appropriate patient status for this patient is INPATIENT. Inpatient status is judged to be reasonable and necessary in order to provide the required intensity of service to ensure the patient's safety. The patient's presenting symptoms, physical exam findings, and initial radiographic and laboratory data in the context of their chronic comorbidities is felt to place them at high risk for further clinical deterioration. Furthermore, it is not anticipated that the patient will be medically stable for discharge from the hospital within 2 midnights of admission.   * I certify that at the point of admission it is my clinical judgment that the patient will require inpatient hospital care spanning beyond 2 midnights from the point of admission due to high intensity of service, high risk for further deterioration and high frequency of surveillance required.*  Author: Athena Masse, MD 08/04/2021 1:55 AM  For on call review [email or web address].

## 2021-08-04 NOTE — Assessment & Plan Note (Signed)
Slightly down from recent ?Anemia panel ?CBC Latest Ref Rng & Units 08/03/2021 07/27/2021 01/23/2021  ?WBC 4.0 - 10.5 K/uL 9.0 9.3 4.2  ?Hemoglobin 12.0 - 15.0 g/dL 11.4(L) 12.2 12.2  ?Hematocrit 36.0 - 46.0 % 34.3(L) 37.4 36.9  ?Platelets 150 - 400 K/uL 287 268 201.0  ? ? ?

## 2021-08-04 NOTE — Progress Notes (Signed)
OT Cancellation Note ? ?Patient Details ?Name: JAZELYN SIPE ?MRN: [id number] ?DOB: 1935/11/14 ? ? ?Cancelled Treatment:    Reason Eval/Treat Not Completed: Patient at procedure or test/ unavailable. Pt off the floor. Will re-attempt OT evaluation as appropriate.  ? ?Ardeth Perfect., MPH, MS, OTR/L ?ascom [phone] ?08/04/21, 10:36 AM ? ?

## 2021-08-04 NOTE — ED Notes (Signed)
EKG completed prior to patient going to MRI ?

## 2021-08-04 NOTE — ED Notes (Signed)
Provider at bedside to evaluate pt, update her on test results and plan of care. ?

## 2021-08-06 ENCOUNTER — Telehealth: Payer: Self-pay

## 2021-08-06 NOTE — Telephone Encounter (Signed)
Pt states that she is waiting to get referral to see Dr Manuella Ghazi - not needed to see Dr Silvio Pate for follow up  ?

## 2021-08-09 ENCOUNTER — Encounter: Payer: Self-pay | Admitting: *Deleted

## 2021-08-10 ENCOUNTER — Telehealth: Payer: Self-pay

## 2021-08-10 NOTE — Telephone Encounter (Signed)
[facility] - Client ?TELEPHONE ADVICE RECORD ?[facility]? ?Patient ?Name: ?Robin BA ?Logan ?Gender: Female ?DOB: 02-Dec-1935 ?Age: 86 Y 7 M 25 D ?Return ?Phone ?Number: ?[id number] ?(Primary) ?Address: ?City/ ?State/ ?Zip: ?[place] ? [address] ?Client [facility] - Client ?Client Site [place] ?Provider Viviana Simpler- MD ?Contact Type Call ?Who Is Calling Patient / Member / Family / Caregiver ?Call Type Triage / [facility] - Client ?TELEPHONE ADVICE RECORD ?[facility]? ?Patient ?Name: ?Robin BA ?Logan ?Gender: Female ?DOB: 06-18-1935 ?Age: 86 Y 7 M 25 D ?Return ?Phone ?Number: ?[id number] ?(Primary) ?Address: ?City/ ?State/ ?Zip: ?[place] ? [address] ?Client [facility] - Client ?Client Site [place] - Day ?Provider Viviana Simpler- MD ?Contact Type Call ?Who Is Calling Patient / Member / Family / Caregiver ?Call Type Triage / Clinical ?Relationship To Patient Self ?Return Phone Number [phone] (Primary) ?Chief Complaint NUMBNESS/TINGLING- sudden on one side of ?the body or face ?Reason for Call Symptomatic / Request for Health Information ?Initial Comment Caller states that she has waist/hip pain and trouble ?walking. She has numbness0. in her left leg. ?Translation No ?Nurse Assessment ?Nurse: Hardin Negus, RN, Mardene Celeste Date/Time [name] Time): 08/10/2021 8:38:40 AM ?Confirm and document reason for call. If ?symptomatic, describe symptoms. ?---She was seen in the ER on Feb 24 for numbness. ?She was told she had a stroke. She was seen by her ?PMD. A couple days later she had more tingling and ?numbness and was seen in the ER. On Tuesday of this ?week the pain her hip and waist started. She is wanting ?to see her PMD as soon as possible. The pain stays the ?same and she is having trouble walking. She has an ?appointment her on April  5th but she is in to much pain ?to wait ?Does the patient have any new or worsening ?symptoms? ---Yes ?Will a triage be completed? ---Yes ?Related visit to physician within the last 2 weeks? ---Yes ?Does the PT have any chronic conditions? (i.e. ?diabetes, asthma, this includes High risk factors for ?pregnancy, etc.) ?---Yes ?List chronic conditions. ---chronic pain ?Is this a behavioral health or substance abuse call? ---No ?Guidelines ?Guideline Title Affirmed Question Affirmed Notes Nurse Date/Time (Eastern ?Time) ?Disp. Time (Eastern ?Time) Disposition Final User ?08/10/2021 8:35:50 AM Send to Urgent Jennelle Human, Santiago Glad ?PLEASE NOTE: All timestamps contained within this report are represented as [place] Standard Time. ?CONFIDENTIALTY NOTICE: This fax transmission is intended only for the addressee. It contains information that is legally privileged, confidential or ?otherwise protected from use or disclosure. If you are not the intended recipient, you are strictly prohibited from reviewing, disclosing, copying using ?or disseminating any of this information or taking any action in reliance on or regarding this information. If you have received this fax in error, please ?notify us immediately by telephone so that we can arrange for its return to [place]. Phone: [phone], Toll-Free: [phone], Fax: [phone] ?Page: 2 of 2 ?Call Id: [id number] ?08/10/2021 8:48:28 AM Clinical Call Yes Hardin Negus, RN, Patrici ? ? ? ? ?Relationship To Patient Self ?Return Phone Number [phone] (Primary) ?Chief Complaint Walking difficulty ?Reason for Call Request to Schedule Office Appointment ?Initial Comment Caller states it is urgent that she sees her doctor. ?Caller states she has numbness in her arm and leg ?and this would be a follow up from the ER. Caller ?states she is in a lot of pain  and the medication is ?not working and she is unable to walk. ?Translation No ?Disp. Time (Eastern ?Time) Disposition Final  User ?08/10/2021 8:16:40 AM Attempt made - no message left [place], RN, Estill Bamberg ?08/10/2021 8:29:33 AM Attempt made - no message left [place], RN, Estill Bamberg ?08/10/2021 8:42:27 AM FINAL ATTEMPT MADE - ?no message left ?Yes [place], RN, Estill Bamberg ?Comments ?User: [name], RN Date/Time [name] Time): 08/10/2021 8:29:19 A ?

## 2021-08-10 NOTE — Telephone Encounter (Signed)
Per appt notes pt has  HFU with Dr Glori Bickers on 08/13/21 at 12 noon. I spoke with pt; pt seen at [facility] ED and was admitted to hospital on 08/03/21. Pt said she is not having any numbness now. Pt said the lt buttock and hip pain started on 08/07/21. Pt spoke with pain mgt on 08/08/21 and was given prednisone but that has not helped the pain and pain level now is 7. Advised pt no available appts at [facility] and advised pt can go to ED for eval of pain. Pt said if pain worsens this weekend she will go to ED. Sending note to Dr Silvio Pate who is out of office but returning on 08/13/21 and Dr Glori Bickers and [place] CMA. ?

## 2021-08-10 NOTE — Telephone Encounter (Signed)
Aware ?Agree with ER parameters  ?

## 2021-08-13 ENCOUNTER — Ambulatory Visit (INDEPENDENT_AMBULATORY_CARE_PROVIDER_SITE_OTHER): Payer: Medicare Other | Admitting: Family Medicine

## 2021-08-13 ENCOUNTER — Encounter: Payer: Self-pay | Admitting: Family Medicine

## 2021-08-13 ENCOUNTER — Ambulatory Visit (INDEPENDENT_AMBULATORY_CARE_PROVIDER_SITE_OTHER)
Admission: RE | Admit: 2021-08-13 | Discharge: 2021-08-13 | Disposition: A | Discharge status: Home / Self Care | Payer: Medicare Other | Source: Ambulatory Visit | Attending: Family Medicine | Admitting: Family Medicine

## 2021-08-13 ENCOUNTER — Other Ambulatory Visit: Payer: Self-pay

## 2021-08-13 VITALS — BP 112/66 | HR 71 | Ht 64.0 in | Wt 132.0 lb

## 2021-08-13 DIAGNOSIS — M48061 Spinal stenosis, lumbar region without neurogenic claudication: Secondary | ICD-10-CM

## 2021-08-13 DIAGNOSIS — I639 Cerebral infarction, unspecified: Secondary | ICD-10-CM

## 2021-08-13 DIAGNOSIS — M4126 Other idiopathic scoliosis, lumbar region: Secondary | ICD-10-CM | POA: Diagnosis not present

## 2021-08-13 DIAGNOSIS — M545 Low back pain, unspecified: Secondary | ICD-10-CM | POA: Diagnosis not present

## 2021-08-13 MED ORDER — CLOPIDOGREL BISULFATE 75 MG PO TABS: 75.0000 mg | ORAL_TABLET | Freq: Every day | ORAL | 0 refills | Status: AC

## 2021-08-13 NOTE — Assessment & Plan Note (Addendum)
Old ER records reviewed ?Patient presently taking both Plavix and aspirin ?Complains of occasional mild tingling in the left hand but no other symptoms currently ?Blood pressure and cholesterol are both controlled ?Cervical degenerative disc disease may account for some of this as well ?She has follow-up planned with neurology Dr. Brigitte Pulse on April 5 ?Also has follow-up with cardiology planned and is wearing a ZIO monitor ?Reviewed ER parameters in detail our ED stroke symptoms to watch for ?

## 2021-08-13 NOTE — Progress Notes (Signed)
Subjective:    Patient ID: Robin Logan, female    DOB: 1935-09-04, 86 y.o.   MRN: [id number]  This visit occurred during the SARS-CoV-2 public health emergency.  Safety protocols were in place, including screening questions prior to the visit, additional usage of staff PPE, and extensive cleaning of exam room while observing appropriate contact time as indicated for disinfecting solutions.   HPI 86 yo pt of Dr Silvio Pate presents for back pain (acute on chronic)  she has h/o CVA on plavix and asa from a week prior  (small L frontal infarct) Has h/o carotid dz and tias as well    Wt Readings from Last 3 Encounters:  08/13/21 132 lb (59.9 kg)  08/03/21 130 lb (59 kg)  08/03/21 132 lb (59.9 kg)   22.66 kg/m  Pt was seen in ER for numbness in left arm and leg [facility] still has occ paresthesia in her L hand only  F/u with neuro/Dr Manuella Ghazi is planned 4/5  Continues plavix and asa     She is to be on asa for 2 wk and plavix indefinitely    Upon review of most recent ER visit 08/03/21  HPI at that time Robin Logan is a 86 y.o. female with history of a recent CVA on Plavix and aspirin, hyperlipidemia, hypothyroidism who presents for evaluation of left-sided numbness.  Patient was discharged from the hospital a week ago on Plavix and aspirin after having a small CVA.  Today she had 3 episodes of left-sided numbness.  2 of them involving the left upper extremity and one of them involving the left upper and left lower extremities.  All the episodes lasted 3 to 5 minutes and resolved without intervention.  She feels back to baseline at this time.  No chest pain or shortness of breath, no fever or chills, no headache.  CT head done that day CT HEAD WO CONTRAST   Result Date: 08/03/2021 CLINICAL PATIENT HAS A DATA:  Left arm numbness for several hours EXAM: CT HEAD WITHOUT CONTRAST TECHNIQUE: Contiguous axial images were obtained from the base of the skull through the vertex without  intravenous contrast. RADIATION DOSE REDUCTION: This exam was performed according to the departmental dose-optimization program which includes automated exposure control, adjustment of the mA and/or kV according to patient size and/or use of iterative reconstruction technique. COMPARISON:  07/27/2021 FINDINGS: Brain: No evidence of acute infarction, hemorrhage, hydrocephalus, extra-axial collection or mass lesion/mass effect. Mild atrophic changes are again seen and stable. Vascular: No hyperdense vessel or unexpected calcification. Skull: Normal. Negative for fracture or focal lesion. Sinuses/Orbits: No acute finding. Other: None. IMPRESSION: Mild atrophic changes without acute abnormality  Then MRI MR BRAIN WO CONTRAST (Accession [id number]) (Order [id number]) Imaging Date: 08/03/2021 Department: [facility] EMERGENCY DEPARTMENT Released By/Authorizing: Rudene Re, MD (auto-released)   Exam Status  Status  Final [99]   PACS Intelerad Image Link   Show images for MR BRAIN WO CONTRAST  Study Result  Narrative & Impression  CLINICAL DATA:  Transient ischemic attack   EXAM: MRI HEAD WITHOUT CONTRAST   MRI CERVICAL SPINE WITHOUT CONTRAST   TECHNIQUE: Multiplanar, multiecho pulse sequences of the brain and surrounding structures, and cervical spine, to include the craniocervical junction and cervicothoracic junction, were obtained without intravenous contrast.   COMPARISON:  None.   FINDINGS: MRI HEAD FINDINGS   Brain: Faint area of diffusion restriction in the dorsolateral aspect of the cervicomedullary junction. Fewer than 5 scattered microhemorrhages in  a nonspecific pattern. There is multifocal hyperintense T2-weighted signal within the white matter. Generalized volume loss without a clear lobar predilection. The midline structures are normal.   Vascular: Major flow voids are preserved.   Skull and upper cervical spine: Normal calvarium and  skull base. Visualized upper cervical spine and soft tissues are normal.   Sinuses/Orbits:No paranasal sinus fluid levels or advanced mucosal thickening. No mastoid or middle ear effusion. Normal orbits.   MRI CERVICAL SPINE FINDINGS   Alignment: Grade 1 retrolisthesis at C3-4 and C5-6. Grade 1 anterolisthesis at C4-5.   Vertebrae: No fracture, evidence of discitis, or bone lesion.   Cord: Normal signal and morphology.   Posterior Fossa, vertebral arteries, paraspinal tissues: Negative.   Disc levels:   C1-2: Unremarkable.   C2-3: Normal disc space and facet joints. There is no spinal canal stenosis. No neural foraminal stenosis.   C3-4: Moderate facet hypertrophy. Medium-sized disc osteophyte complex. There is no spinal canal stenosis. Moderate right neural foraminal stenosis.   C4-5: Disc bulge with endplate spurring. There is no spinal canal stenosis. No neural foraminal stenosis.   C5-6: Medium-sized disc osteophyte complex. Mild spinal canal stenosis. Moderate left neural foraminal stenosis.   C6-7: Small disc bulge. There is no spinal canal stenosis. No neural foraminal stenosis.   C7-T1: Normal disc space and facet joints. There is no spinal canal stenosis. No neural foraminal stenosis.   IMPRESSION: 1. Faint area of diffusion restriction in the dorsolateral aspect of the cervicomedullary junction may indicate a small acute or subacute infarct. 2. Scattered chronic microhemorrhages in a nonspecific pattern. 3. Mild spinal canal stenosis and moderate left neural foraminal stenosis at C5-6. 4. Moderate right C3-4 neural foraminal stenosis.      ER course: ED COURSE: CT showing no acute findings.  Blood work with no signs of sepsis, significant anemia, dehydration, or significant electrolyte derangements.  She is hyponatremic to sodium of 128 but seems to be her baseline since February 2023.  Teleneurology was consulted and recommended repeat MRI of the head  and MRI of the cervical spine.  Also recommend admission to the hospital for further evaluation.  Hospitalist service was consulted and after discussion accepted patient to their service.  MRI is pending.  Per telemetry neurology recommended continuing aspirin and Plavix.  While I was updating patient in the room about these recommendations her rhythm on telemetry changed.  Her heart rate went up to 122 and she had a narrow regular complex tachycardia with no p waves.  The rhythm persisted for several minutes and resolved before I was able to get a repeat EKG.     Pcp: Dr Silvio Pate Neuro : Dr Manuella Ghazi -has appt on 4/5   HTN bp is stable today  No cp or palpitations or headaches or edema  No side effects to medicines  BP Readings from Last 3 Encounters:  08/13/21 112/66  08/04/21 (!) 147/78  08/03/21 118/78    Bp is well controlled   Pulse Readings from Last 3 Encounters:  08/13/21 71  08/04/21 74  08/03/21 68     Hyperlipidemia is well controlled Takes atorvastatin 80 mg daily   No longer has numbness very much at all in L arm and leg  Last numbness was yesterday- in hand for less than 5 minutes   Continues plavix and asa  Needs refill on plavix    History of back problems  Has deg lumbar disc disease  Yoga generally helps  When she has flare ups  sees Dr Sharlet Salina (her pmr doctor)  Usually prednisone helps  Tylenol does nothing for her    Old note from Dr Sharlet Salina note that she has h/o chronic low back pain with radiation into bilateral lateral thighs and posterior lateral calves ongoing for 10 years with occasional flares that usually respond to prednisone   Last flare started about a week ago , Tuesday she went to yoga and had trouble with her poses  Given prednisone this time and it did not help (it usually does) from Dr Sharlet Salina  Now has pain from the waist down  Hurts a lot  Helps to lie down  The lift chair helps her get out  Uses walker to walk - some times it hurts more  to bear weight on R and some days on left   This is different from her usual flare  Starts higher/ from waist down  Radiates to both legs - this is entirely   Old imaging is from 2013   [place] W/O Cm (Accession [id number]) (Order [id number]) Imaging Date: 01/27/2012 Department: [place] [facility] Ordering: Interface, Rad Conversion Authorizing: Default, Provider, MD   Exam Status  Status  Final [99]   Study Result  Narrative & Impression     PRIOR REPORT IMPORTED FROM THE SYNGO [place] EXAM:    Low Back Pain  COMMENTS:    PROCEDURE:     MMR - MMR LUMBAR SPINE WO CONTRAST  - Jan 27 2012 11:34AM    RESULT:     Multiplanar and multisequence MR imaging of the lumbar spine  is  performed with fat and fluid sensitive sequences. The patient has no  previous exam for comparison.    There is a lumbar scoliosis concave toward the left. Degenerative endplate  changes are noted at L5-S1, as well as at L1-L2 and L2-L3. There does not  appear to be severe spinal canal stenosis. There is bilateral foraminal  stenosis at L4-L5 especially prominent on the right with severe  right-sided  L5-S1 stenosis. No compression deformity is evident.    IMPRESSION:  1. Lumbar scoliotic curvature. Multilevel mild degenerative disc space  narrowing with hypertrophic degenerative endplate spurring and disc bulges  causing mild thecal sac deformity and some areas of foraminal stenosis as  described. This is especially prominent with severe foraminal narrowing on  the right at L4-L5 and L5-S1. Correlate with the patient's symptoms prior  to  intervention. There is moderately severe L4-L5 foraminal narrowing on the  left.    Thank you for the opportunity to contribute to the care of your patient.        Patient Active Problem List   Diagnosis Date Noted   Spinal stenosis, lumbar 08/13/2021   Anemia 08/04/2021   TIA (transient ischemic attack)  07/27/2021   Recent cerebrovascular accident (CVA) 07/27/2021   Hyponatremia 07/27/2021   Overactive bladder 08/01/2020   Stage 3a chronic kidney disease ([place]) 08/01/2020   Mood disorder ([place]) 01/18/2020   Carotid arterial disease ([place]) 11/24/2019   Aortic atherosclerosis ([place]) 11/24/2019   Paroxysmal SVT (supraventricular tachycardia) ([place]) 11/15/2019   History of transient ischemic attack (TIA) 11/05/2019   Recurrent cerebrovascular accidents (CVAs) ([place]) 11/05/2019   Osteopenia    Routine general medical examination at a health care facility 11/19/2011   Other constipation 10/30/2010   Hypothyroidism 05/01/2010   HYPERLIPIDEMIA 05/01/2010   DJD (degenerative joint disease), multiple sites 05/01/2010   Sleep disorder 05/01/2010  Review of Systems  Constitutional:  Positive for fatigue. Negative for activity change, appetite change, fever and unexpected weight change.  HENT:  Negative for congestion, ear pain, rhinorrhea, sinus pressure and sore throat.   Eyes:  Negative for pain, redness and visual disturbance.  Respiratory:  Negative for cough, shortness of breath and wheezing.   Cardiovascular:  Negative for chest pain and palpitations.  Gastrointestinal:  Negative for abdominal pain, blood in stool, constipation and diarrhea.  Endocrine: Negative for polydipsia and polyuria.  Genitourinary:  Negative for dysuria, frequency and urgency.  Musculoskeletal:  Positive for back pain. Negative for arthralgias and myalgias.  Skin:  Negative for pallor and rash.  Allergic/Immunologic: Negative for environmental allergies.  Neurological:  Positive for numbness. Negative for dizziness, tremors, syncope, weakness and headaches.  Hematological:  Negative for adenopathy. Does not bruise/bleed easily.  Psychiatric/Behavioral:  Negative for decreased concentration and dysphoric mood. The patient is not nervous/anxious.       Objective:   Physical Exam Constitutional:      General:  She is not in acute distress.    Appearance: Normal appearance. She is well-developed and normal weight.  HENT:     Head: Normocephalic and atraumatic.  Eyes:     Conjunctiva/sclera: Conjunctivae normal.     Pupils: Pupils are equal, round, and reactive to light.  Neck:     Thyroid: No thyromegaly.     Vascular: No carotid bruit or JVD.  Cardiovascular:     Rate and Rhythm: Normal rate and regular rhythm.     Heart sounds: Normal heart sounds.    No gallop.  Pulmonary:     Effort: Pulmonary effort is normal. No respiratory distress.     Breath sounds: Normal breath sounds. No wheezing or rales.  Abdominal:     General: Bowel sounds are normal. There is no distension or abdominal bruit.     Palpations: Abdomen is soft. There is no mass.     Tenderness: There is no abdominal tenderness.  Musculoskeletal:     Cervical back: Normal range of motion and neck supple.     Right lower leg: No edema.     Left lower leg: No edema.     Comments: Some mild tenderness of lumbar musculature bilaterally  No bony tenderness or crepitus  Scoliosis noted Some pain with extension of spine Gait is slow with walker due to pain   SLR yields pain in bilateral lower legs   Lymphadenopathy:     Cervical: No cervical adenopathy.  Skin:    General: Skin is warm and dry.     Coloration: Skin is not pale.     Findings: No rash.  Neurological:     Mental Status: She is alert.     Sensory: No sensory deficit.     Motor: No weakness.     Coordination: Coordination normal.     Deep Tendon Reflexes: Reflexes are normal and symmetric. Reflexes normal.     Comments: Nl sensation and strength in UE and LEs today in office   Psychiatric:        Mood and Affect: Mood normal.        Cognition and Memory: Cognition and memory normal.          Assessment & Plan:   Problem List Items Addressed This Visit       Cardiovascular and Mediastinum   Recurrent cerebrovascular accidents (CVAs) [facility])    Old  ER records reviewed Patient presently taking both Plavix and  aspirin Complains of occasional mild tingling in the left hand but no other symptoms currently Blood pressure and cholesterol are both controlled Cervical degenerative disc disease may account for some of this as well She has follow-up planned with neurology Dr. Brigitte Pulse on April 5 Also has follow-up with cardiology planned and is wearing a ZIO monitor Reviewed ER parameters in detail our ED stroke symptoms to watch for        Other   Spinal stenosis, lumbar - Primary    Patient has a history of lumbar degenerative disc disease with spinal stenosis seen by Dr. Sharlet Salina  Flares usually respond to courses of prednisone and yoga/physical therapy In the past she notes to have had injections as well The most recent course of prednisone was not effective She notes that this flare feels a little different with pain radiating to both legs Hesitant to advise her to use ibuprofen given the fact she is now on both aspirin and Plavix for recent stroke Advised her to call Dr. Carles Collet again to update in terms of her current situation We will obtain a lumbar spine x-ray today as well as she has not had one in a while      Relevant Orders   DG Lumbar Spine Complete

## 2021-08-13 NOTE — Assessment & Plan Note (Signed)
Patient has a history of lumbar degenerative disc disease with spinal stenosis seen by Dr. Sharlet Salina  ?Flares usually respond to courses of prednisone and yoga/physical therapy ?In the past she notes to have had injections as well ?The most recent course of prednisone was not effective ?She notes that this flare feels a little different with pain radiating to both legs ?Hesitant to advise her to use ibuprofen given the fact she is now on both aspirin and Plavix for recent stroke ?Advised her to call Dr. Carles Collet again to update in terms of her current situation ?We will obtain a lumbar spine x-ray today as well as she has not had one in a while ?

## 2021-08-13 NOTE — Patient Instructions (Addendum)
Continue the aspirin and plavix ? ?Low back xray today  ? ?I will reach out to Dr Sharlet Salina ?Please call his office and let them know that the prednisone did not help and that we did an xray today  ? ? ?Use heat or ice -whichever helps most  ?Tylenol as needed  ? ? ?If pain or numbness suddenly worse -go to the ER  ? ? ?

## 2021-08-14 ENCOUNTER — Other Ambulatory Visit: Payer: Self-pay | Admitting: Internal Medicine

## 2021-08-15 ENCOUNTER — Emergency Department: Payer: Medicare Other

## 2021-08-15 ENCOUNTER — Other Ambulatory Visit: Payer: Self-pay

## 2021-08-15 ENCOUNTER — Emergency Department
Admission: EM | Admit: 2021-08-15 | Discharge: 2021-08-15 | Disposition: A | Discharge status: Home / Self Care | Payer: Medicare Other | Attending: Emergency Medicine | Admitting: Emergency Medicine

## 2021-08-15 DIAGNOSIS — I10 Essential (primary) hypertension: Secondary | ICD-10-CM | POA: Diagnosis not present

## 2021-08-15 DIAGNOSIS — R9431 Abnormal electrocardiogram [ECG] [EKG]: Secondary | ICD-10-CM | POA: Diagnosis not present

## 2021-08-15 DIAGNOSIS — M5124 Other intervertebral disc displacement, thoracic region: Secondary | ICD-10-CM | POA: Diagnosis not present

## 2021-08-15 DIAGNOSIS — M48061 Spinal stenosis, lumbar region without neurogenic claudication: Secondary | ICD-10-CM | POA: Insufficient documentation

## 2021-08-15 DIAGNOSIS — X501XXA Overexertion from prolonged static or awkward postures, initial encounter: Secondary | ICD-10-CM | POA: Diagnosis not present

## 2021-08-15 DIAGNOSIS — S3992XA Unspecified injury of lower back, initial encounter: Secondary | ICD-10-CM | POA: Diagnosis present

## 2021-08-15 DIAGNOSIS — S3210XA Unspecified fracture of sacrum, initial encounter for closed fracture: Secondary | ICD-10-CM | POA: Diagnosis not present

## 2021-08-15 DIAGNOSIS — M5126 Other intervertebral disc displacement, lumbar region: Secondary | ICD-10-CM | POA: Diagnosis not present

## 2021-08-15 DIAGNOSIS — M8448XA Pathological fracture, other site, initial encounter for fracture: Secondary | ICD-10-CM

## 2021-08-15 DIAGNOSIS — M4848XA Fatigue fracture of vertebra, sacral and sacrococcygeal region, initial encounter for fracture: Secondary | ICD-10-CM | POA: Diagnosis not present

## 2021-08-15 DIAGNOSIS — R52 Pain, unspecified: Secondary | ICD-10-CM | POA: Diagnosis not present

## 2021-08-15 LAB — COMPREHENSIVE METABOLIC PANEL
ALT: 24 U/L (ref 0–44)
AST: 23 U/L (ref 15–41)
Albumin: 3.5 g/dL (ref 3.5–5.0)
Alkaline Phosphatase: 119 U/L (ref 38–126)
Anion gap: 10 (ref 5–15)
BUN: 25 mg/dL — ABNORMAL HIGH (ref 8–23)
CO2: 23 mmol/L (ref 22–32)
Calcium: 9 mg/dL (ref 8.9–10.3)
Chloride: 96 mmol/L — ABNORMAL LOW (ref 98–111)
Creatinine, Ser: 1.08 mg/dL — ABNORMAL HIGH (ref 0.44–1.00)
GFR, Estimated: 51 mL/min — ABNORMAL LOW (ref >60)
Glucose, Bld: 113 mg/dL — ABNORMAL HIGH (ref 70–99)
Potassium: 4.3 mmol/L (ref 3.5–5.1)
Sodium: 129 mmol/L — ABNORMAL LOW (ref 135–145)
Total Bilirubin: 0.8 mg/dL (ref 0.3–1.2)
Total Protein: 6.9 g/dL (ref 6.5–8.1)

## 2021-08-15 LAB — CBC
HCT: 37.7 % (ref 36.0–46.0)
Hemoglobin: 12.7 g/dL (ref 12.0–15.0)
MCH: 30.8 pg (ref 26.0–34.0)
MCHC: 33.7 g/dL (ref 30.0–36.0)
MCV: 91.5 fL (ref 80.0–100.0)
Platelets: 454 10*3/uL — ABNORMAL HIGH (ref 150–400)
RBC: 4.12 MIL/uL (ref 3.87–5.11)
RDW: 12.5 % (ref 11.5–15.5)
WBC: 13.7 10*3/uL — ABNORMAL HIGH (ref 4.0–10.5)
nRBC: 0 % (ref 0.0–0.2)

## 2021-08-15 LAB — URINALYSIS, ROUTINE W REFLEX MICROSCOPIC
Bilirubin Urine: NEGATIVE
Glucose, UA: NEGATIVE mg/dL
Hgb urine dipstick: NEGATIVE
Ketones, ur: 5 mg/dL — AB
Leukocytes,Ua: NEGATIVE
Nitrite: NEGATIVE
Protein, ur: NEGATIVE mg/dL
Specific Gravity, Urine: 1.029 (ref 1.005–1.030)
pH: 5 (ref 5.0–8.0)

## 2021-08-15 MED ORDER — HYDROCODONE-ACETAMINOPHEN 5-325 MG PO TABS
1.0000 | ORAL_TABLET | Freq: Once | ORAL | Status: AC
  Administered 2021-08-15: 1 via ORAL
  Filled 2021-08-15: qty 1

## 2021-08-15 MED ORDER — HYDROCODONE-ACETAMINOPHEN 5-325 MG PO TABS: 1.0000 | ORAL_TABLET | Freq: Four times a day (QID) | ORAL | 0 refills | Status: DC | PRN

## 2021-08-15 MED ORDER — ATORVASTATIN CALCIUM 80 MG PO TABS: 80.0000 mg | ORAL_TABLET | Freq: Every day | ORAL | 0 refills | Status: DC

## 2021-08-15 NOTE — ED Provider Notes (Signed)
? ?[facility] ?Provider Note ? ? ? Event Date/Time  ? First MD Initiated Contact with Patient 08/15/21 [phone]   ?  (approximate) ? ? ?History  ? ?Back Pain and Numbness ? ? ?HPI ? ?Robin Logan is a 86 y.o. female history of a recent stroke currently on dual antiplatelet therapy, and lumbar stenosis ? ?She reports that for a couple weeks now she has been experiencing pain in her right lower back.  She saw her doctor Dr. Edsel Petrin, and was treated with prednisone but it has not provided any relief.  The only thing that relieves her pain is sitting up in her recliner ?  ?She reports her pain is severe 9 out of 10 in intensity to the point when she tries to get up and walk it is so severe that she has trouble walking.  She has no weakness no numbness but reports when the pain is present it is a stinging burning sharp pain that runs from her mid to right lower back out over the buttock and towards her right front of her leg. ? ?No change in urine or bowel habits. ? ?No new numbness or weakness except for the burning feeling in this leg and region.  She has intermittently experienced numbness in her left arm since the time of her stroke, reports this is nothing new and at present time it is not causing her any issue or present ? ?She has follow-up with neurologist upcoming in about 3 weeks time ? ?Physical Exam  ? ?Triage Vital Signs: ?ED Triage Vitals  ?Enc Vitals Group  ?   BP 08/15/21 0801 121/68  ?   Pulse Rate 08/15/21 0801 73  ?   Resp 08/15/21 0801 14  ?   Temp 08/15/21 0801 97.8 ?F (36.6 ?C)  ?   Temp src --   ?   SpO2 08/15/21 0801 98 %  ?   Weight --   ?   Height --   ?   Head Circumference --   ?   Peak Flow --   ?   Pain Score 08/15/21 0759 10  ?   Pain Loc --   ?   Pain Edu? --   ?   Excl. in [place]? --   ? ? ?Most recent vital signs: ?Vitals:  ? 08/15/21 1100 08/15/21 1130  ?BP: 116/65 109/66  ?Pulse: 71 65  ?Resp: 19 14  ?Temp:    ?SpO2: 100% 98%  ? ? ? ?General: Awake, no distress.  Very  pleasant.  Rest comfortably but when she bends forward flexes or moves she reports pain in the right lower back that radiates out.  ZIO patch noted on chest (informed MRI team of said) ?CV:  Good peripheral perfusion.  Normal heart tones normal rate and rhythm ?Resp:  Normal effort.  Clear lungs bilateral ?Abd:  No distention.  Denies pain to palpation any abdominal quadrant, no pain to palpation of lower abdomen bilaterally.  No tenderness to percussion costovertebral angles bilaterally.  She does report moderate tenderness in her mid lumbar region over the paraspinous region.  She does report pain of similar nature elucidated by straight leg raise on the right side ?Other:  Strong and normal dorsalis pedis and posterior tibial pulses in the lower extremities bilaterally ? ?Good flexion and extension of the knees hips and ankles.  No muscular deficits noted in the lower extremities bilaterally.  Grossly normal use of the upper extremities bilaterally ? ?Normal facial  smile.  Symmetric use of arms and legs.  Pain limits use of the right leg especially with flexion extension ? ?No falls or injuries ? ? ?ED Results / Procedures / Treatments  ? ?Labs ?(all labs ordered are listed, but only abnormal results are displayed) ?Labs Reviewed  ?CBC - Abnormal; Notable for the following components:  ?    Result Value  ? WBC 13.7 (*)   ? Platelets 454 (*)   ? All other components within normal limits  ?COMPREHENSIVE METABOLIC PANEL - Abnormal; Notable for the following components:  ? Sodium 129 (*)   ? Chloride 96 (*)   ? Glucose, Bld 113 (*)   ? BUN 25 (*)   ? Creatinine, Ser 1.08 (*)   ? GFR, Estimated 51 (*)   ? All other components within normal limits  ?URINALYSIS, ROUTINE W REFLEX MICROSCOPIC - Abnormal; Notable for the following components:  ? Color, Urine AMBER (*)   ? APPearance HAZY (*)   ? Ketones, ur 5 (*)   ? All other components within normal limits  ? ? ? ?EKG ? ?Reviewed inter by me at 8 AM ?Heart rate 79 QRS  100 ?QTc 420 ?Normal sinus rhythm, no evidence of ischemia or ectopy.  Slight baseline wander ? ? ?RADIOLOGY ?MR THORACIC SPINE WO CONTRAST ? ?Result Date: 08/15/2021 ?CLINICAL DATA:  Right-sided mid to lower back pain.  No injury. EXAM: MRI THORACIC AND LUMBAR SPINE WITHOUT CONTRAST TECHNIQUE: Multiplanar and multiecho pulse sequences of the thoracic and lumbar spine were obtained without intravenous contrast. COMPARISON:  Lumbar spine x-rays dated August 13, 2021. Chest x-ray dated September 30, 2014. MRI lumbar spine dated January 27, 2012. FINDINGS: MRI THORACIC SPINE FINDINGS Alignment: Unchanged levoscoliosis. Trace anterolisthesis at T1-T2, T3-T4, and T4-T5, and T5-T6. Vertebrae: No fracture, evidence of discitis, or bone lesion. Asymmetric left-sided endplate Schmorl's nodes with associated degenerative marrow edema at T11-T12. Cord:  Normal signal and morphology. Paraspinal and other soft tissues: Negative. Disc levels: Scattered small disc protrusions and disc bulges at multiple levels. Scattered mild facet arthropathy. No spinal canal or neuroforaminal stenosis at any level. MRI LUMBAR SPINE FINDINGS Segmentation:  Standard. Alignment: Unchanged dextroscoliosis. Unchanged trace anterolisthesis at L5-S1. Vertebrae: No compression fracture. Insufficiency fractures of the S2 vertebral body (series 22, image 9), and bilateral sacral ala (series 22, images 1 and 17). No evidence of discitis or suspicious bone lesion. Asymmetric right-sided degenerative endplate marrow edema at L2-L3, L3-L4, and L5-S1. Conus medullaris and cauda equina: Conus extends to the L1-L2 level. Conus and cauda equina appear normal. Paraspinal and other soft tissues: Small right renal cysts. Otherwise negative. Disc levels: T12-L1: Minimal disc bulging and mild bilateral facet arthropathy. No stenosis. L1-L2: Mild disc bulging and bilateral facet arthropathy. Mild left lateral recess stenosis. No spinal canal or neuroforaminal stenosis. L2-L3:  Asymmetric left-sided disc bulging and endplate spurring. Mild bilateral facet arthropathy. Mild left lateral recess stenosis. No spinal canal or neuroforaminal stenosis. L3-L4: Asymmetric left-sided disc osteophyte complex and mild bilateral facet arthropathy. Mild left lateral recess stenosis. No spinal canal or neuroforaminal stenosis. L4-L5: Mild disc bulging with right-sided disc osteophyte complex. Mild-to-moderate bilateral facet arthropathy. Mild right lateral recess stenosis. Progressive mild to moderate right neuroforaminal stenosis. No spinal canal or left neuroforaminal stenosis. L5-S1: Mild disc bulging with right far lateral disc osteophyte complex. Mild-to-moderate bilateral facet arthropathy. Unchanged moderate to severe right neuroforaminal stenosis. No spinal canal or left neuroforaminal stenosis. IMPRESSION: 1. Insufficiency fractures of the S2 vertebral body and  bilateral sacral ala. 2. Multilevel degenerative changes of the lumbar spine as described above, overall similar to prior study. Progressive mild to moderate right neuroforaminal stenosis at L4-L5. Unchanged moderate to severe right neuroforaminal stenosis at L5-S1. 3. No spinal canal stenosis in the thoracic or lumbar spine. Electronically Signed   By: Titus Dubin M.D.   On: 08/15/2021 10:23  ? ?MR LUMBAR SPINE WO CONTRAST ? ?Result Date: 08/15/2021 ?CLINICAL DATA:  Right-sided mid to lower back pain.  No injury. EXAM: MRI THORACIC AND LUMBAR SPINE WITHOUT CONTRAST TECHNIQUE: Multiplanar and multiecho pulse sequences of the thoracic and lumbar spine were obtained without intravenous contrast. COMPARISON:  Lumbar spine x-rays dated August 13, 2021. Chest x-ray dated September 30, 2014. MRI lumbar spine dated January 27, 2012. FINDINGS: MRI THORACIC SPINE FINDINGS Alignment: Unchanged levoscoliosis. Trace anterolisthesis at T1-T2, T3-T4, and T4-T5, and T5-T6. Vertebrae: No fracture, evidence of discitis, or bone lesion. Asymmetric left-sided  endplate Schmorl's nodes with associated degenerative marrow edema at T11-T12. Cord:  Normal signal and morphology. Paraspinal and other soft tissues: Negative. Disc levels: Scattered small disc protr

## 2021-08-15 NOTE — ED Triage Notes (Addendum)
Pt comes via EMs from [facility] with c/o back pain. Pt states pain is worse when walking. Pt states possible spinal stenosis. ? ?Pt states pain is getting worse and no relief unless sitting down.  ?

## 2021-08-15 NOTE — ED Notes (Signed)
Pt assisted to toilet 

## 2021-08-15 NOTE — ED Notes (Signed)
Pt husband to pick up pt. Pt placed in lobby, and notified Kendal RN that pt needs assistance with transferring in car. Pt given discharge papers and questions answered about follow up care.  ?

## 2021-08-15 NOTE — ED Notes (Signed)
Pt back from MRI 

## 2021-08-15 NOTE — ED Notes (Signed)
Assisted patient to the toilet.

## 2021-08-15 NOTE — ED Notes (Signed)
Pt taken to MRI  

## 2021-08-16 ENCOUNTER — Ambulatory Visit: Payer: Medicare Other | Admitting: Internal Medicine

## 2021-08-17 DIAGNOSIS — M48061 Spinal stenosis, lumbar region without neurogenic claudication: Secondary | ICD-10-CM | POA: Diagnosis not present

## 2021-08-17 DIAGNOSIS — I680 Cerebral amyloid angiopathy: Secondary | ICD-10-CM | POA: Diagnosis not present

## 2021-08-17 DIAGNOSIS — S32020A Wedge compression fracture of second lumbar vertebra, initial encounter for closed fracture: Secondary | ICD-10-CM | POA: Diagnosis not present

## 2021-08-17 DIAGNOSIS — M545 Low back pain, unspecified: Secondary | ICD-10-CM | POA: Diagnosis not present

## 2021-08-17 DIAGNOSIS — Z8679 Personal history of other diseases of the circulatory system: Secondary | ICD-10-CM | POA: Diagnosis not present

## 2021-08-17 DIAGNOSIS — R2681 Unsteadiness on feet: Secondary | ICD-10-CM | POA: Diagnosis not present

## 2021-08-17 DIAGNOSIS — M6281 Muscle weakness (generalized): Secondary | ICD-10-CM | POA: Diagnosis not present

## 2021-08-17 DIAGNOSIS — M8448XD Pathological fracture, other site, subsequent encounter for fracture with routine healing: Secondary | ICD-10-CM | POA: Diagnosis not present

## 2021-08-20 DIAGNOSIS — N3281 Overactive bladder: Secondary | ICD-10-CM | POA: Diagnosis not present

## 2021-08-20 DIAGNOSIS — Z8673 Personal history of transient ischemic attack (TIA), and cerebral infarction without residual deficits: Secondary | ICD-10-CM | POA: Diagnosis not present

## 2021-08-20 DIAGNOSIS — F39 Unspecified mood [affective] disorder: Secondary | ICD-10-CM | POA: Diagnosis not present

## 2021-08-20 DIAGNOSIS — I471 Supraventricular tachycardia: Secondary | ICD-10-CM | POA: Diagnosis not present

## 2021-08-20 DIAGNOSIS — M8448XD Pathological fracture, other site, subsequent encounter for fracture with routine healing: Secondary | ICD-10-CM | POA: Diagnosis not present

## 2021-08-20 DIAGNOSIS — M48061 Spinal stenosis, lumbar region without neurogenic claudication: Secondary | ICD-10-CM | POA: Diagnosis not present

## 2021-08-20 DIAGNOSIS — R2681 Unsteadiness on feet: Secondary | ICD-10-CM | POA: Diagnosis not present

## 2021-08-20 DIAGNOSIS — S3210XA Unspecified fracture of sacrum, initial encounter for closed fracture: Secondary | ICD-10-CM | POA: Diagnosis not present

## 2021-08-20 DIAGNOSIS — M545 Low back pain, unspecified: Secondary | ICD-10-CM | POA: Diagnosis not present

## 2021-08-20 DIAGNOSIS — M6281 Muscle weakness (generalized): Secondary | ICD-10-CM | POA: Diagnosis not present

## 2021-08-20 DIAGNOSIS — M4807 Spinal stenosis, lumbosacral region: Secondary | ICD-10-CM | POA: Diagnosis not present

## 2021-08-21 DIAGNOSIS — R2681 Unsteadiness on feet: Secondary | ICD-10-CM | POA: Diagnosis not present

## 2021-08-21 DIAGNOSIS — M4807 Spinal stenosis, lumbosacral region: Secondary | ICD-10-CM | POA: Diagnosis not present

## 2021-08-21 DIAGNOSIS — M8448XD Pathological fracture, other site, subsequent encounter for fracture with routine healing: Secondary | ICD-10-CM | POA: Diagnosis not present

## 2021-08-21 DIAGNOSIS — M545 Low back pain, unspecified: Secondary | ICD-10-CM | POA: Diagnosis not present

## 2021-08-21 DIAGNOSIS — M48061 Spinal stenosis, lumbar region without neurogenic claudication: Secondary | ICD-10-CM | POA: Diagnosis not present

## 2021-08-21 DIAGNOSIS — M6281 Muscle weakness (generalized): Secondary | ICD-10-CM | POA: Diagnosis not present

## 2021-08-22 DIAGNOSIS — M8448XD Pathological fracture, other site, subsequent encounter for fracture with routine healing: Secondary | ICD-10-CM | POA: Diagnosis not present

## 2021-08-22 DIAGNOSIS — M545 Low back pain, unspecified: Secondary | ICD-10-CM | POA: Diagnosis not present

## 2021-08-22 DIAGNOSIS — R2681 Unsteadiness on feet: Secondary | ICD-10-CM | POA: Diagnosis not present

## 2021-08-22 DIAGNOSIS — M6281 Muscle weakness (generalized): Secondary | ICD-10-CM | POA: Diagnosis not present

## 2021-08-22 DIAGNOSIS — M48061 Spinal stenosis, lumbar region without neurogenic claudication: Secondary | ICD-10-CM | POA: Diagnosis not present

## 2021-08-23 DIAGNOSIS — M6281 Muscle weakness (generalized): Secondary | ICD-10-CM | POA: Diagnosis not present

## 2021-08-23 DIAGNOSIS — R2681 Unsteadiness on feet: Secondary | ICD-10-CM | POA: Diagnosis not present

## 2021-08-23 DIAGNOSIS — M8448XD Pathological fracture, other site, subsequent encounter for fracture with routine healing: Secondary | ICD-10-CM | POA: Diagnosis not present

## 2021-08-23 DIAGNOSIS — M48061 Spinal stenosis, lumbar region without neurogenic claudication: Secondary | ICD-10-CM | POA: Diagnosis not present

## 2021-08-23 DIAGNOSIS — I639 Cerebral infarction, unspecified: Secondary | ICD-10-CM | POA: Diagnosis not present

## 2021-08-23 DIAGNOSIS — M545 Low back pain, unspecified: Secondary | ICD-10-CM | POA: Diagnosis not present

## 2021-08-23 DIAGNOSIS — I6389 Other cerebral infarction: Secondary | ICD-10-CM | POA: Diagnosis not present

## 2021-08-24 DIAGNOSIS — M8448XD Pathological fracture, other site, subsequent encounter for fracture with routine healing: Secondary | ICD-10-CM | POA: Diagnosis not present

## 2021-08-24 DIAGNOSIS — M6281 Muscle weakness (generalized): Secondary | ICD-10-CM | POA: Diagnosis not present

## 2021-08-24 DIAGNOSIS — M545 Low back pain, unspecified: Secondary | ICD-10-CM | POA: Diagnosis not present

## 2021-08-24 DIAGNOSIS — M48061 Spinal stenosis, lumbar region without neurogenic claudication: Secondary | ICD-10-CM | POA: Diagnosis not present

## 2021-08-24 DIAGNOSIS — R2681 Unsteadiness on feet: Secondary | ICD-10-CM | POA: Diagnosis not present

## 2021-08-25 DIAGNOSIS — R2681 Unsteadiness on feet: Secondary | ICD-10-CM | POA: Diagnosis not present

## 2021-08-25 DIAGNOSIS — M545 Low back pain, unspecified: Secondary | ICD-10-CM | POA: Diagnosis not present

## 2021-08-25 DIAGNOSIS — M8448XD Pathological fracture, other site, subsequent encounter for fracture with routine healing: Secondary | ICD-10-CM | POA: Diagnosis not present

## 2021-08-25 DIAGNOSIS — M6281 Muscle weakness (generalized): Secondary | ICD-10-CM | POA: Diagnosis not present

## 2021-08-25 DIAGNOSIS — M48061 Spinal stenosis, lumbar region without neurogenic claudication: Secondary | ICD-10-CM | POA: Diagnosis not present

## 2021-08-27 DIAGNOSIS — M6281 Muscle weakness (generalized): Secondary | ICD-10-CM | POA: Diagnosis not present

## 2021-08-27 DIAGNOSIS — M545 Low back pain, unspecified: Secondary | ICD-10-CM | POA: Diagnosis not present

## 2021-08-27 DIAGNOSIS — R2681 Unsteadiness on feet: Secondary | ICD-10-CM | POA: Diagnosis not present

## 2021-08-27 DIAGNOSIS — M8448XD Pathological fracture, other site, subsequent encounter for fracture with routine healing: Secondary | ICD-10-CM | POA: Diagnosis not present

## 2021-08-27 DIAGNOSIS — M48061 Spinal stenosis, lumbar region without neurogenic claudication: Secondary | ICD-10-CM | POA: Diagnosis not present

## 2021-08-31 ENCOUNTER — Ambulatory Visit (INDEPENDENT_AMBULATORY_CARE_PROVIDER_SITE_OTHER): Payer: Medicare Other | Admitting: Internal Medicine

## 2021-08-31 ENCOUNTER — Encounter: Payer: Self-pay | Admitting: Internal Medicine

## 2021-08-31 DIAGNOSIS — M8448XD Pathological fracture, other site, subsequent encounter for fracture with routine healing: Secondary | ICD-10-CM | POA: Diagnosis not present

## 2021-08-31 DIAGNOSIS — I639 Cerebral infarction, unspecified: Secondary | ICD-10-CM | POA: Diagnosis not present

## 2021-08-31 DIAGNOSIS — S3210XD Unspecified fracture of sacrum, subsequent encounter for fracture with routine healing: Secondary | ICD-10-CM

## 2021-08-31 DIAGNOSIS — R278 Other lack of coordination: Secondary | ICD-10-CM | POA: Diagnosis not present

## 2021-08-31 DIAGNOSIS — M48061 Spinal stenosis, lumbar region without neurogenic claudication: Secondary | ICD-10-CM | POA: Diagnosis not present

## 2021-08-31 DIAGNOSIS — R2689 Other abnormalities of gait and mobility: Secondary | ICD-10-CM | POA: Diagnosis not present

## 2021-08-31 DIAGNOSIS — M6281 Muscle weakness (generalized): Secondary | ICD-10-CM | POA: Diagnosis not present

## 2021-08-31 DIAGNOSIS — Z8673 Personal history of transient ischemic attack (TIA), and cerebral infarction without residual deficits: Secondary | ICD-10-CM

## 2021-08-31 DIAGNOSIS — N1831 Chronic kidney disease, stage 3a: Secondary | ICD-10-CM | POA: Diagnosis not present

## 2021-08-31 DIAGNOSIS — I471 Supraventricular tachycardia: Secondary | ICD-10-CM | POA: Diagnosis not present

## 2021-08-31 DIAGNOSIS — F39 Unspecified mood [affective] disorder: Secondary | ICD-10-CM | POA: Diagnosis not present

## 2021-08-31 DIAGNOSIS — S3210XA Unspecified fracture of sacrum, initial encounter for closed fracture: Secondary | ICD-10-CM | POA: Insufficient documentation

## 2021-08-31 DIAGNOSIS — R2681 Unsteadiness on feet: Secondary | ICD-10-CM | POA: Diagnosis not present

## 2021-08-31 DIAGNOSIS — M545 Low back pain, unspecified: Secondary | ICD-10-CM | POA: Diagnosis not present

## 2021-08-31 DIAGNOSIS — I69398 Other sequelae of cerebral infarction: Secondary | ICD-10-CM | POA: Diagnosis not present

## 2021-08-31 DIAGNOSIS — Z741 Need for assistance with personal care: Secondary | ICD-10-CM | POA: Diagnosis not present

## 2021-08-31 DIAGNOSIS — N3281 Overactive bladder: Secondary | ICD-10-CM | POA: Diagnosis not present

## 2021-08-31 MED ORDER — HYDROCODONE-ACETAMINOPHEN 5-325 MG PO TABS: 1.0000 | ORAL_TABLET | Freq: Four times a day (QID) | ORAL | 0 refills | Status: DC | PRN

## 2021-08-31 NOTE — Assessment & Plan Note (Signed)
Still on ASA 81 and plavix 75 ?I expect Dr Manuella Ghazi will stop the ASA ?On atorvastatin '80mg'$  daily ?

## 2021-08-31 NOTE — Assessment & Plan Note (Signed)
Got prednisone from ortho---but no clear improvement ?May need norco regularly--but too soon to tell ?Can stop the gabapentin for now ?

## 2021-08-31 NOTE — Patient Instructions (Signed)
Stay off the gabapentin. ?Use the hydrocodone only if the pain is bad----do not take regularly. ?I expect Dr Manuella Ghazi to stop your aspirin at his visit---but take it (and the plavix) until you see him ?

## 2021-08-31 NOTE — Progress Notes (Signed)
? ?Subjective:  ? ? Patient ID: Robin Logan, female    DOB: 01/27/1936, 86 y.o.   MRN: [id number] ? ?HPI ?Here for follow up after hospitalization and rehab stay ?With husband ? ?Needs a lot of help at home ?Saw ortho--Mr Rogers Blocker. Put on prednisone (apparently tapering) ?Still has pain--especially with walking. This is much better though ?Using the hydrocodone regularly still ?PT and OT will be coming to her home starting today ?Pain is worst in back when standing or walking ? ?Walking with walker ?Did shower herself this am---with shower chair and grab bar ?Dresses and bathroom herself ?No incontinence ?Husband doing shopping---[employer] on line and deliveries from the [place] ? ?Has gabapentin but hasn't been taking it ? ?Goes to neurologist next week ? ?Current Outpatient Medications on File Prior to Visit  ?Medication Sig Dispense Refill  ? Ascorbic Acid (VITAMIN C) 1000 MG tablet Take 1,000 mg by mouth daily.    ? aspirin EC 81 MG tablet Take 1 tablet (81 mg total) by mouth daily. Continue on aspirin and plavix x 3 weeks and then stop aspirin and just continue plavix    ? atorvastatin (LIPITOR) 80 MG tablet Take 1 tablet (80 mg total) by mouth daily. 90 tablet 0  ? cholecalciferol (VITAMIN D) 1000 UNITS tablet Take 1,000 Units by mouth daily.    ? clopidogrel (PLAVIX) 75 MG tablet Take 1 tablet (75 mg total) by mouth daily. 90 tablet 0  ? HYDROcodone-acetaminophen (NORCO/VICODIN) 5-325 MG tablet Take 1 tablet by mouth every 6 (six) hours as needed for severe pain. 16 tablet 0  ? levothyroxine (SYNTHROID) 88 MCG tablet TAKE 1 TABLET DAILY 90 tablet 3  ? mirabegron ER (MYRBETRIQ) 50 MG TB24 tablet Take 1 tablet (50 mg total) by mouth daily. 90 tablet 3  ? mirtazapine (REMERON) 45 MG tablet Take 1 tablet (45 mg total) by mouth at bedtime. 90 tablet 3  ? Multiple Vitamins-Minerals (ICAPS AREDS 2 PO) Take by mouth.    ? predniSONE (STERAPRED UNI-PAK 21 TAB) 10 MG (21) TBPK tablet Take by mouth.    ? senna-docusate  (SENOKOT-S) 8.6-50 MG per tablet Take 2-4 tablets by mouth 2 (two) times daily.    ? metoprolol succinate (TOPROL-XL) 50 MG 24 hr tablet Take 0.5 tablets (25 mg total) by mouth daily. Take with or immediately following a meal. 90 tablet 2  ? ?No current facility-administered medications on file prior to visit.  ? ? ?No Known Allergies ? ?Past Medical History:  ?Diagnosis Date  ? Arthritis   ? Depression   ? Hyperlipidemia   ? Osteopenia   ? Osteopenia   ? Sleep disorder   ? Thyroid disease   ? ? ?Past Surgical History:  ?Procedure Laterality Date  ? ABDOMINAL HYSTERECTOMY    ? APPENDECTOMY    ? CATARACT EXTRACTION W/PHACO Right 03/03/2019  ? Procedure: CATARACT EXTRACTION PHACO AND INTRAOCULAR LENS PLACEMENT (IOC) RIGHT  00:44.0  16.1%  7.12;  Surgeon: Leandrew Koyanagi, MD;  Location: [place];  Service: Ophthalmology;  Laterality: Right;  ? CATARACT EXTRACTION W/PHACO Left 03/24/2019  ? Procedure: CATARACT EXTRACTION PHACO AND INTRAOCULAR LENS PLACEMENT (IOC) LEFT  01:09.4  12.5%  8.73;  Surgeon: Leandrew Koyanagi, MD;  Location: [place];  Service: Ophthalmology;  Laterality: Left;  ? [place]  ? ? ?Family History  ?Problem Relation Age of Onset  ? Hypertension Father   ? Stroke Brother   ? Diabetes Brother   ?  Cancer Neg Hx   ?     no breast or colon  ? ? ?Social History  ? ?Socioeconomic History  ? Marital status: Married  ?  Spouse name: Not on file  ? Number of children: Not on file  ? Years of education: Not on file  ? Highest education level: Not on file  ?Occupational History  ? Occupation: retired- Production manager of deeds [employer]  ?Tobacco Use  ? Smoking status: Former  ?  Types: Cigarettes  ?  Quit date: 06/03/1978  ?  Years since quitting: 43.2  ?  Passive exposure: Past  ? Smokeless tobacco: Never  ?Vaping Use  ? Vaping Use: Never used  ?Substance and Sexual Activity  ? Alcohol use: Yes  ?  Alcohol/week: 0.0 standard drinks  ?  Comment: wine   ? Drug use: No  ? Sexual activity: Not on file  ?Other Topics Concern  ? Not on file  ?Social History Narrative  ? Requests DNR --order done 12/24/10. Currently keeping it in draw, would accept resuscitation for now  ? Has living will  ? Husband is health care POA---alternate would be nieces (both sides)  ? No tube feedings if cognitively unaware  ? ?Social Determinants of Health  ? ?Financial Resource Strain: Not on file  ?Food Insecurity: Not on file  ?Transportation Needs: Not on file  ?Physical Activity: Not on file  ?Stress: Not on file  ?Social Connections: Not on file  ?Intimate Partner Violence: Not on file  ? ?Review of Systems ?Sleeps okay--nocturia x 2-3 (some trouble getting back to sleep) ?Bowels are okay---going daily--even without the miralax ?Appetite is very good---friends bringing a lot of food ?   ?Objective:  ? Physical Exam ?Constitutional:   ?   Appearance: Normal appearance.  ?Cardiovascular:  ?   Rate and Rhythm: Normal rate and regular rhythm.  ?   Heart sounds:  ?  No gallop.  ?   Comments: Soft systolic murmur ?Pulmonary:  ?   Effort: Pulmonary effort is normal.  ?   Breath sounds: Normal breath sounds. No wheezing or rales.  ?Musculoskeletal:  ?   Cervical back: Neck supple.  ?   Right lower leg: No edema.  ?   Left lower leg: No edema.  ?Lymphadenopathy:  ?   Cervical: No cervical adenopathy.  ?Neurological:  ?   Mental Status: She is alert.  ?   Comments: No focal weakness ?Walks stably with rolling walker---just slow  ?  ? ? ? ? ?   ?Assessment & Plan:  ? ?

## 2021-08-31 NOTE — Assessment & Plan Note (Signed)
Pain is significantly better ?Continues on hydrocodone 5/325----asked her to take only prn (had been more regular) ?Working with PT/OT at home ?

## 2021-09-03 DIAGNOSIS — N3281 Overactive bladder: Secondary | ICD-10-CM | POA: Diagnosis not present

## 2021-09-03 DIAGNOSIS — Z741 Need for assistance with personal care: Secondary | ICD-10-CM | POA: Diagnosis not present

## 2021-09-03 DIAGNOSIS — M8448XD Pathological fracture, other site, subsequent encounter for fracture with routine healing: Secondary | ICD-10-CM | POA: Diagnosis not present

## 2021-09-03 DIAGNOSIS — M545 Low back pain, unspecified: Secondary | ICD-10-CM | POA: Diagnosis not present

## 2021-09-03 DIAGNOSIS — R278 Other lack of coordination: Secondary | ICD-10-CM | POA: Diagnosis not present

## 2021-09-03 DIAGNOSIS — F39 Unspecified mood [affective] disorder: Secondary | ICD-10-CM | POA: Diagnosis not present

## 2021-09-03 DIAGNOSIS — I69398 Other sequelae of cerebral infarction: Secondary | ICD-10-CM | POA: Diagnosis not present

## 2021-09-03 DIAGNOSIS — R2681 Unsteadiness on feet: Secondary | ICD-10-CM | POA: Diagnosis not present

## 2021-09-03 DIAGNOSIS — I471 Supraventricular tachycardia: Secondary | ICD-10-CM | POA: Diagnosis not present

## 2021-09-03 DIAGNOSIS — M6281 Muscle weakness (generalized): Secondary | ICD-10-CM | POA: Diagnosis not present

## 2021-09-03 DIAGNOSIS — R2689 Other abnormalities of gait and mobility: Secondary | ICD-10-CM | POA: Diagnosis not present

## 2021-09-03 DIAGNOSIS — M48061 Spinal stenosis, lumbar region without neurogenic claudication: Secondary | ICD-10-CM | POA: Diagnosis not present

## 2021-09-03 DIAGNOSIS — N1831 Chronic kidney disease, stage 3a: Secondary | ICD-10-CM | POA: Diagnosis not present

## 2021-09-04 ENCOUNTER — Ambulatory Visit: Payer: Medicare Other | Admitting: Cardiology

## 2021-09-05 DIAGNOSIS — E559 Vitamin D deficiency, unspecified: Secondary | ICD-10-CM | POA: Diagnosis not present

## 2021-09-05 DIAGNOSIS — M4807 Spinal stenosis, lumbosacral region: Secondary | ICD-10-CM | POA: Diagnosis not present

## 2021-09-05 DIAGNOSIS — I618 Other nontraumatic intracerebral hemorrhage: Secondary | ICD-10-CM | POA: Diagnosis not present

## 2021-09-05 DIAGNOSIS — R202 Paresthesia of skin: Secondary | ICD-10-CM | POA: Diagnosis not present

## 2021-09-05 DIAGNOSIS — M8448XD Pathological fracture, other site, subsequent encounter for fracture with routine healing: Secondary | ICD-10-CM | POA: Diagnosis not present

## 2021-09-05 DIAGNOSIS — M48061 Spinal stenosis, lumbar region without neurogenic claudication: Secondary | ICD-10-CM | POA: Diagnosis not present

## 2021-09-05 DIAGNOSIS — Z8673 Personal history of transient ischemic attack (TIA), and cerebral infarction without residual deficits: Secondary | ICD-10-CM | POA: Diagnosis not present

## 2021-09-06 DIAGNOSIS — M48061 Spinal stenosis, lumbar region without neurogenic claudication: Secondary | ICD-10-CM | POA: Diagnosis not present

## 2021-09-06 DIAGNOSIS — I471 Supraventricular tachycardia: Secondary | ICD-10-CM | POA: Diagnosis not present

## 2021-09-06 DIAGNOSIS — M8448XD Pathological fracture, other site, subsequent encounter for fracture with routine healing: Secondary | ICD-10-CM | POA: Diagnosis not present

## 2021-09-06 DIAGNOSIS — I69398 Other sequelae of cerebral infarction: Secondary | ICD-10-CM | POA: Diagnosis not present

## 2021-09-06 DIAGNOSIS — F39 Unspecified mood [affective] disorder: Secondary | ICD-10-CM | POA: Diagnosis not present

## 2021-09-06 DIAGNOSIS — N1831 Chronic kidney disease, stage 3a: Secondary | ICD-10-CM | POA: Diagnosis not present

## 2021-09-10 DIAGNOSIS — I69398 Other sequelae of cerebral infarction: Secondary | ICD-10-CM | POA: Diagnosis not present

## 2021-09-10 DIAGNOSIS — I471 Supraventricular tachycardia: Secondary | ICD-10-CM | POA: Diagnosis not present

## 2021-09-10 DIAGNOSIS — F39 Unspecified mood [affective] disorder: Secondary | ICD-10-CM | POA: Diagnosis not present

## 2021-09-10 DIAGNOSIS — M8448XD Pathological fracture, other site, subsequent encounter for fracture with routine healing: Secondary | ICD-10-CM | POA: Diagnosis not present

## 2021-09-10 DIAGNOSIS — M48061 Spinal stenosis, lumbar region without neurogenic claudication: Secondary | ICD-10-CM | POA: Diagnosis not present

## 2021-09-10 DIAGNOSIS — N1831 Chronic kidney disease, stage 3a: Secondary | ICD-10-CM | POA: Diagnosis not present

## 2021-09-12 ENCOUNTER — Encounter: Payer: Self-pay | Admitting: Internal Medicine

## 2021-09-12 DIAGNOSIS — M48061 Spinal stenosis, lumbar region without neurogenic claudication: Secondary | ICD-10-CM | POA: Diagnosis not present

## 2021-09-12 DIAGNOSIS — F39 Unspecified mood [affective] disorder: Secondary | ICD-10-CM | POA: Diagnosis not present

## 2021-09-12 DIAGNOSIS — I471 Supraventricular tachycardia: Secondary | ICD-10-CM | POA: Diagnosis not present

## 2021-09-12 DIAGNOSIS — I69398 Other sequelae of cerebral infarction: Secondary | ICD-10-CM | POA: Diagnosis not present

## 2021-09-12 DIAGNOSIS — M8448XD Pathological fracture, other site, subsequent encounter for fracture with routine healing: Secondary | ICD-10-CM | POA: Diagnosis not present

## 2021-09-12 DIAGNOSIS — N1831 Chronic kidney disease, stage 3a: Secondary | ICD-10-CM | POA: Diagnosis not present

## 2021-09-13 MED ORDER — HYDROCODONE-ACETAMINOPHEN 5-325 MG PO TABS
1.0000 | ORAL_TABLET | Freq: Four times a day (QID) | ORAL | 0 refills | Status: DC | PRN
Start: 2021-09-13 — End: 2021-10-09

## 2021-09-14 DIAGNOSIS — M48061 Spinal stenosis, lumbar region without neurogenic claudication: Secondary | ICD-10-CM | POA: Diagnosis not present

## 2021-09-14 DIAGNOSIS — N1831 Chronic kidney disease, stage 3a: Secondary | ICD-10-CM | POA: Diagnosis not present

## 2021-09-14 DIAGNOSIS — I471 Supraventricular tachycardia: Secondary | ICD-10-CM | POA: Diagnosis not present

## 2021-09-14 DIAGNOSIS — F39 Unspecified mood [affective] disorder: Secondary | ICD-10-CM | POA: Diagnosis not present

## 2021-09-14 DIAGNOSIS — M8448XD Pathological fracture, other site, subsequent encounter for fracture with routine healing: Secondary | ICD-10-CM | POA: Diagnosis not present

## 2021-09-14 DIAGNOSIS — I69398 Other sequelae of cerebral infarction: Secondary | ICD-10-CM | POA: Diagnosis not present

## 2021-09-17 DIAGNOSIS — I471 Supraventricular tachycardia: Secondary | ICD-10-CM | POA: Diagnosis not present

## 2021-09-17 DIAGNOSIS — N1831 Chronic kidney disease, stage 3a: Secondary | ICD-10-CM | POA: Diagnosis not present

## 2021-09-17 DIAGNOSIS — M48061 Spinal stenosis, lumbar region without neurogenic claudication: Secondary | ICD-10-CM | POA: Diagnosis not present

## 2021-09-17 DIAGNOSIS — F39 Unspecified mood [affective] disorder: Secondary | ICD-10-CM | POA: Diagnosis not present

## 2021-09-17 DIAGNOSIS — I69398 Other sequelae of cerebral infarction: Secondary | ICD-10-CM | POA: Diagnosis not present

## 2021-09-17 DIAGNOSIS — M8448XD Pathological fracture, other site, subsequent encounter for fracture with routine healing: Secondary | ICD-10-CM | POA: Diagnosis not present

## 2021-09-18 DIAGNOSIS — B351 Tinea unguium: Secondary | ICD-10-CM | POA: Diagnosis not present

## 2021-09-18 DIAGNOSIS — M79675 Pain in left toe(s): Secondary | ICD-10-CM | POA: Diagnosis not present

## 2021-09-18 DIAGNOSIS — M79674 Pain in right toe(s): Secondary | ICD-10-CM | POA: Diagnosis not present

## 2021-09-18 DIAGNOSIS — L84 Corns and callosities: Secondary | ICD-10-CM | POA: Diagnosis not present

## 2021-09-19 DIAGNOSIS — N1831 Chronic kidney disease, stage 3a: Secondary | ICD-10-CM | POA: Diagnosis not present

## 2021-09-19 DIAGNOSIS — M48061 Spinal stenosis, lumbar region without neurogenic claudication: Secondary | ICD-10-CM | POA: Diagnosis not present

## 2021-09-19 DIAGNOSIS — F39 Unspecified mood [affective] disorder: Secondary | ICD-10-CM | POA: Diagnosis not present

## 2021-09-19 DIAGNOSIS — I69398 Other sequelae of cerebral infarction: Secondary | ICD-10-CM | POA: Diagnosis not present

## 2021-09-19 DIAGNOSIS — I471 Supraventricular tachycardia: Secondary | ICD-10-CM | POA: Diagnosis not present

## 2021-09-19 DIAGNOSIS — M8448XD Pathological fracture, other site, subsequent encounter for fracture with routine healing: Secondary | ICD-10-CM | POA: Diagnosis not present

## 2021-09-21 DIAGNOSIS — F39 Unspecified mood [affective] disorder: Secondary | ICD-10-CM | POA: Diagnosis not present

## 2021-09-21 DIAGNOSIS — I69398 Other sequelae of cerebral infarction: Secondary | ICD-10-CM | POA: Diagnosis not present

## 2021-09-21 DIAGNOSIS — N1831 Chronic kidney disease, stage 3a: Secondary | ICD-10-CM | POA: Diagnosis not present

## 2021-09-21 DIAGNOSIS — M48061 Spinal stenosis, lumbar region without neurogenic claudication: Secondary | ICD-10-CM | POA: Diagnosis not present

## 2021-09-21 DIAGNOSIS — I471 Supraventricular tachycardia: Secondary | ICD-10-CM | POA: Diagnosis not present

## 2021-09-21 DIAGNOSIS — M8448XD Pathological fracture, other site, subsequent encounter for fracture with routine healing: Secondary | ICD-10-CM | POA: Diagnosis not present

## 2021-09-24 DIAGNOSIS — M8448XD Pathological fracture, other site, subsequent encounter for fracture with routine healing: Secondary | ICD-10-CM | POA: Diagnosis not present

## 2021-09-24 DIAGNOSIS — N1831 Chronic kidney disease, stage 3a: Secondary | ICD-10-CM | POA: Diagnosis not present

## 2021-09-24 DIAGNOSIS — F39 Unspecified mood [affective] disorder: Secondary | ICD-10-CM | POA: Diagnosis not present

## 2021-09-24 DIAGNOSIS — I69398 Other sequelae of cerebral infarction: Secondary | ICD-10-CM | POA: Diagnosis not present

## 2021-09-24 DIAGNOSIS — I471 Supraventricular tachycardia: Secondary | ICD-10-CM | POA: Diagnosis not present

## 2021-09-24 DIAGNOSIS — M48061 Spinal stenosis, lumbar region without neurogenic claudication: Secondary | ICD-10-CM | POA: Diagnosis not present

## 2021-09-26 DIAGNOSIS — I471 Supraventricular tachycardia: Secondary | ICD-10-CM | POA: Diagnosis not present

## 2021-09-26 DIAGNOSIS — N1831 Chronic kidney disease, stage 3a: Secondary | ICD-10-CM | POA: Diagnosis not present

## 2021-09-26 DIAGNOSIS — I69398 Other sequelae of cerebral infarction: Secondary | ICD-10-CM | POA: Diagnosis not present

## 2021-09-26 DIAGNOSIS — M8448XD Pathological fracture, other site, subsequent encounter for fracture with routine healing: Secondary | ICD-10-CM | POA: Diagnosis not present

## 2021-09-26 DIAGNOSIS — F39 Unspecified mood [affective] disorder: Secondary | ICD-10-CM | POA: Diagnosis not present

## 2021-09-26 DIAGNOSIS — M48061 Spinal stenosis, lumbar region without neurogenic claudication: Secondary | ICD-10-CM | POA: Diagnosis not present

## 2021-10-02 ENCOUNTER — Ambulatory Visit (INDEPENDENT_AMBULATORY_CARE_PROVIDER_SITE_OTHER): Payer: Medicare Other | Admitting: Internal Medicine

## 2021-10-02 ENCOUNTER — Encounter: Payer: Self-pay | Admitting: Internal Medicine

## 2021-10-02 VITALS — BP 122/84 | HR 73 | Temp 97.7°F | Ht 64.0 in | Wt 130.0 lb

## 2021-10-02 DIAGNOSIS — E2839 Other primary ovarian failure: Secondary | ICD-10-CM

## 2021-10-02 DIAGNOSIS — I639 Cerebral infarction, unspecified: Secondary | ICD-10-CM

## 2021-10-02 DIAGNOSIS — S3210XD Unspecified fracture of sacrum, subsequent encounter for fracture with routine healing: Secondary | ICD-10-CM

## 2021-10-02 DIAGNOSIS — M48061 Spinal stenosis, lumbar region without neurogenic claudication: Secondary | ICD-10-CM | POA: Diagnosis not present

## 2021-10-02 NOTE — Progress Notes (Signed)
? ?Subjective:  ? ? Patient ID: Robin Logan, female    DOB: [birth year], 86 y.o.   MRN: [id number] ? ?HPI ?Here for follow up of sacral fracture and LSS ?With husband ? ?Still having low back pain and down right leg ?Winds up needing hydrocodone in the morning--and usually a second later in the day ?Dr Manuella Ghazi plans to do EMG/NCV ? ?Still doing PT at home ? ?Current Outpatient Medications on File Prior to Visit  ?Medication Sig Dispense Refill  ? Ascorbic Acid (VITAMIN C) 1000 MG tablet Take 1,000 mg by mouth daily.    ? cholecalciferol (VITAMIN D) 1000 UNITS tablet Take 1,000 Units by mouth daily.    ? clopidogrel (PLAVIX) 75 MG tablet Take 1 tablet (75 mg total) by mouth daily. 90 tablet 0  ? HYDROcodone-acetaminophen (NORCO/VICODIN) 5-325 MG tablet Take 1 tablet by mouth every 6 (six) hours as needed for severe pain. 60 tablet 0  ? levothyroxine (SYNTHROID) 88 MCG tablet TAKE 1 TABLET DAILY 90 tablet 3  ? mirabegron ER (MYRBETRIQ) 50 MG TB24 tablet Take 1 tablet (50 mg total) by mouth daily. 90 tablet 3  ? mirtazapine (REMERON) 45 MG tablet Take 1 tablet (45 mg total) by mouth at bedtime. 90 tablet 3  ? Multiple Vitamins-Minerals (ICAPS AREDS 2 PO) Take by mouth.    ? Polyethylene Glycol 3350 (MIRALAX PO) Take by mouth.    ? atorvastatin (LIPITOR) 80 MG tablet Take 1 tablet (80 mg total) by mouth daily. 90 tablet 0  ? metoprolol succinate (TOPROL-XL) 50 MG 24 hr tablet Take 0.5 tablets (25 mg total) by mouth daily. Take with or immediately following a meal. 90 tablet 2  ? ?No current facility-administered medications on file prior to visit.  ? ? ?No Known Allergies ? ?Past Medical History:  ?Diagnosis Date  ? Arthritis   ? Depression   ? Hyperlipidemia   ? Osteopenia   ? Osteopenia   ? Sleep disorder   ? Thyroid disease   ? ? ?Past Surgical History:  ?Procedure Laterality Date  ? ABDOMINAL HYSTERECTOMY    ? APPENDECTOMY    ? CATARACT EXTRACTION W/PHACO Right 03/03/2019  ? Procedure: CATARACT EXTRACTION PHACO AND  INTRAOCULAR LENS PLACEMENT (IOC) RIGHT  00:44.0  16.1%  7.12;  Surgeon: Leandrew Koyanagi, MD;  Location: [place];  Service: Ophthalmology;  Laterality: Right;  ? CATARACT EXTRACTION W/PHACO Left 03/24/2019  ? Procedure: CATARACT EXTRACTION PHACO AND INTRAOCULAR LENS PLACEMENT (IOC) LEFT  01:09.4  12.5%  8.73;  Surgeon: Leandrew Koyanagi, MD;  Location: [place];  Service: Ophthalmology;  Laterality: Left;  ? [place]  ? ? ?Family History  ?Problem Relation Age of Onset  ? Hypertension Father   ? Stroke Brother   ? Diabetes Brother   ? Cancer Neg Hx   ?     no breast or colon  ? ? ?Social History  ? ?Socioeconomic History  ? Marital status: Married  ?  Spouse name: Not on file  ? Number of children: Not on file  ? Years of education: Not on file  ? Highest education level: Not on file  ?Occupational History  ? Occupation: retired- Production manager of [employer]  ?Tobacco Use  ? Smoking status: Former  ?  Types: Cigarettes  ?  Quit date: 06/03/1978  ?  Years since quitting: 43.3  ?  Passive exposure: Past  ? Smokeless tobacco: Never  ?Vaping Use  ? Vaping Use:  Never used  ?Substance and Sexual Activity  ? Alcohol use: Yes  ?  Alcohol/week: 0.0 standard drinks  ?  Comment: wine  ? Drug use: No  ? Sexual activity: Not on file  ?Other Topics Concern  ? Not on file  ?Social History Narrative  ? Requests DNR --order done 12/24/10. Currently keeping it in draw, would accept resuscitation for now  ? Has living will  ? Husband is health care POA---alternate would be nieces (both sides)  ? No tube feedings if cognitively unaware  ? ?Social Determinants of Health  ? ?Financial Resource Strain: Not on file  ?Food Insecurity: Not on file  ?Transportation Needs: Not on file  ?Physical Activity: Not on file  ?Stress: Not on file  ?Social Connections: Not on file  ?Intimate Partner Violence: Not on file  ? ?Review of Systems ?Sleeps okay with the evening hydrocodone  and mirtazapine ?Still having bladder problems--but mybretriq does help (no incontinence) ?   ?Objective:  ? Physical Exam ?Constitutional:   ?   Appearance: Normal appearance.  ?Neurological:  ?   Mental Status: She is alert.  ?   Comments: Gets up and down from chair easily and walks normally (working on getting rid of the walker)  ?  ? ? ? ? ?   ?Assessment & Plan:  ? ?

## 2021-10-02 NOTE — Assessment & Plan Note (Signed)
Now seems to have chronic pain from this ?Having NCV/EMG by Dr Manuella Ghazi ?Using norco bid ?

## 2021-10-02 NOTE — Assessment & Plan Note (Signed)
Probably pain gone from this ?Will check DEXA to be sure not major decline since 2015 ?Consider bisphosphanate--actonel probably ?

## 2021-10-02 NOTE — Patient Instructions (Signed)
Please call [facility] to set up the bone density test (DEXA) ?

## 2021-10-05 DIAGNOSIS — N3281 Overactive bladder: Secondary | ICD-10-CM | POA: Diagnosis not present

## 2021-10-05 DIAGNOSIS — M545 Low back pain, unspecified: Secondary | ICD-10-CM | POA: Diagnosis not present

## 2021-10-05 DIAGNOSIS — N1831 Chronic kidney disease, stage 3a: Secondary | ICD-10-CM | POA: Diagnosis not present

## 2021-10-05 DIAGNOSIS — I471 Supraventricular tachycardia: Secondary | ICD-10-CM | POA: Diagnosis not present

## 2021-10-05 DIAGNOSIS — Z741 Need for assistance with personal care: Secondary | ICD-10-CM | POA: Diagnosis not present

## 2021-10-05 DIAGNOSIS — R278 Other lack of coordination: Secondary | ICD-10-CM | POA: Diagnosis not present

## 2021-10-05 DIAGNOSIS — M6281 Muscle weakness (generalized): Secondary | ICD-10-CM | POA: Diagnosis not present

## 2021-10-05 DIAGNOSIS — I69398 Other sequelae of cerebral infarction: Secondary | ICD-10-CM | POA: Diagnosis not present

## 2021-10-05 DIAGNOSIS — R2689 Other abnormalities of gait and mobility: Secondary | ICD-10-CM | POA: Diagnosis not present

## 2021-10-05 DIAGNOSIS — F39 Unspecified mood [affective] disorder: Secondary | ICD-10-CM | POA: Diagnosis not present

## 2021-10-05 DIAGNOSIS — M8448XD Pathological fracture, other site, subsequent encounter for fracture with routine healing: Secondary | ICD-10-CM | POA: Diagnosis not present

## 2021-10-05 DIAGNOSIS — M48061 Spinal stenosis, lumbar region without neurogenic claudication: Secondary | ICD-10-CM | POA: Diagnosis not present

## 2021-10-05 DIAGNOSIS — R2681 Unsteadiness on feet: Secondary | ICD-10-CM | POA: Diagnosis not present

## 2021-10-08 DIAGNOSIS — I69398 Other sequelae of cerebral infarction: Secondary | ICD-10-CM | POA: Diagnosis not present

## 2021-10-08 DIAGNOSIS — M48061 Spinal stenosis, lumbar region without neurogenic claudication: Secondary | ICD-10-CM | POA: Diagnosis not present

## 2021-10-08 DIAGNOSIS — F39 Unspecified mood [affective] disorder: Secondary | ICD-10-CM | POA: Diagnosis not present

## 2021-10-08 DIAGNOSIS — I471 Supraventricular tachycardia: Secondary | ICD-10-CM | POA: Diagnosis not present

## 2021-10-08 DIAGNOSIS — M8448XD Pathological fracture, other site, subsequent encounter for fracture with routine healing: Secondary | ICD-10-CM | POA: Diagnosis not present

## 2021-10-08 DIAGNOSIS — N1831 Chronic kidney disease, stage 3a: Secondary | ICD-10-CM | POA: Diagnosis not present

## 2021-10-09 ENCOUNTER — Other Ambulatory Visit: Payer: Self-pay | Admitting: Internal Medicine

## 2021-10-09 MED ORDER — HYDROCODONE-ACETAMINOPHEN 5-325 MG PO TABS: 1.0000 | ORAL_TABLET | Freq: Four times a day (QID) | ORAL | 0 refills | Status: DC | PRN

## 2021-10-09 NOTE — Telephone Encounter (Signed)
Patient comment: down to 14 pills   need refill please ?

## 2021-10-10 DIAGNOSIS — I471 Supraventricular tachycardia: Secondary | ICD-10-CM | POA: Diagnosis not present

## 2021-10-10 DIAGNOSIS — M48061 Spinal stenosis, lumbar region without neurogenic claudication: Secondary | ICD-10-CM | POA: Diagnosis not present

## 2021-10-10 DIAGNOSIS — N1831 Chronic kidney disease, stage 3a: Secondary | ICD-10-CM | POA: Diagnosis not present

## 2021-10-10 DIAGNOSIS — F39 Unspecified mood [affective] disorder: Secondary | ICD-10-CM | POA: Diagnosis not present

## 2021-10-10 DIAGNOSIS — M8448XD Pathological fracture, other site, subsequent encounter for fracture with routine healing: Secondary | ICD-10-CM | POA: Diagnosis not present

## 2021-10-10 DIAGNOSIS — I69398 Other sequelae of cerebral infarction: Secondary | ICD-10-CM | POA: Diagnosis not present

## 2021-10-12 DIAGNOSIS — I471 Supraventricular tachycardia: Secondary | ICD-10-CM | POA: Diagnosis not present

## 2021-10-12 DIAGNOSIS — I69398 Other sequelae of cerebral infarction: Secondary | ICD-10-CM | POA: Diagnosis not present

## 2021-10-12 DIAGNOSIS — F39 Unspecified mood [affective] disorder: Secondary | ICD-10-CM | POA: Diagnosis not present

## 2021-10-12 DIAGNOSIS — M8448XD Pathological fracture, other site, subsequent encounter for fracture with routine healing: Secondary | ICD-10-CM | POA: Diagnosis not present

## 2021-10-12 DIAGNOSIS — N1831 Chronic kidney disease, stage 3a: Secondary | ICD-10-CM | POA: Diagnosis not present

## 2021-10-12 DIAGNOSIS — M48061 Spinal stenosis, lumbar region without neurogenic claudication: Secondary | ICD-10-CM | POA: Diagnosis not present

## 2021-10-15 DIAGNOSIS — N1831 Chronic kidney disease, stage 3a: Secondary | ICD-10-CM | POA: Diagnosis not present

## 2021-10-15 DIAGNOSIS — F39 Unspecified mood [affective] disorder: Secondary | ICD-10-CM | POA: Diagnosis not present

## 2021-10-15 DIAGNOSIS — M8448XD Pathological fracture, other site, subsequent encounter for fracture with routine healing: Secondary | ICD-10-CM | POA: Diagnosis not present

## 2021-10-15 DIAGNOSIS — I69398 Other sequelae of cerebral infarction: Secondary | ICD-10-CM | POA: Diagnosis not present

## 2021-10-15 DIAGNOSIS — M48061 Spinal stenosis, lumbar region without neurogenic claudication: Secondary | ICD-10-CM | POA: Diagnosis not present

## 2021-10-15 DIAGNOSIS — I471 Supraventricular tachycardia: Secondary | ICD-10-CM | POA: Diagnosis not present

## 2021-10-17 ENCOUNTER — Ambulatory Visit
Admission: RE | Admit: 2021-10-17 | Discharge: 2021-10-17 | Disposition: A | Discharge status: Home / Self Care | Payer: Medicare Other | Source: Ambulatory Visit | Attending: Internal Medicine | Admitting: Internal Medicine

## 2021-10-17 DIAGNOSIS — S3210XD Unspecified fracture of sacrum, subsequent encounter for fracture with routine healing: Secondary | ICD-10-CM

## 2021-10-17 DIAGNOSIS — N1831 Chronic kidney disease, stage 3a: Secondary | ICD-10-CM | POA: Diagnosis not present

## 2021-10-17 DIAGNOSIS — R202 Paresthesia of skin: Secondary | ICD-10-CM | POA: Diagnosis not present

## 2021-10-17 DIAGNOSIS — R200 Anesthesia of skin: Secondary | ICD-10-CM | POA: Diagnosis not present

## 2021-10-17 DIAGNOSIS — Z780 Asymptomatic menopausal state: Secondary | ICD-10-CM | POA: Diagnosis not present

## 2021-10-17 DIAGNOSIS — I471 Supraventricular tachycardia: Secondary | ICD-10-CM | POA: Diagnosis not present

## 2021-10-17 DIAGNOSIS — E2839 Other primary ovarian failure: Secondary | ICD-10-CM

## 2021-10-17 DIAGNOSIS — F39 Unspecified mood [affective] disorder: Secondary | ICD-10-CM | POA: Diagnosis not present

## 2021-10-17 DIAGNOSIS — M48061 Spinal stenosis, lumbar region without neurogenic claudication: Secondary | ICD-10-CM | POA: Diagnosis not present

## 2021-10-17 DIAGNOSIS — I69398 Other sequelae of cerebral infarction: Secondary | ICD-10-CM | POA: Diagnosis not present

## 2021-10-17 DIAGNOSIS — M810 Age-related osteoporosis without current pathological fracture: Secondary | ICD-10-CM | POA: Diagnosis not present

## 2021-10-17 DIAGNOSIS — M8448XD Pathological fracture, other site, subsequent encounter for fracture with routine healing: Secondary | ICD-10-CM | POA: Diagnosis not present

## 2021-10-19 ENCOUNTER — Encounter: Payer: Self-pay | Admitting: Cardiology

## 2021-10-19 ENCOUNTER — Ambulatory Visit (INDEPENDENT_AMBULATORY_CARE_PROVIDER_SITE_OTHER): Payer: Medicare Other | Admitting: Cardiology

## 2021-10-19 VITALS — BP 124/70 | HR 72 | Ht 64.0 in | Wt 132.2 lb

## 2021-10-19 DIAGNOSIS — G459 Transient cerebral ischemic attack, unspecified: Secondary | ICD-10-CM | POA: Diagnosis not present

## 2021-10-19 DIAGNOSIS — I69398 Other sequelae of cerebral infarction: Secondary | ICD-10-CM | POA: Diagnosis not present

## 2021-10-19 DIAGNOSIS — I471 Supraventricular tachycardia: Secondary | ICD-10-CM | POA: Diagnosis not present

## 2021-10-19 DIAGNOSIS — M48061 Spinal stenosis, lumbar region without neurogenic claudication: Secondary | ICD-10-CM | POA: Diagnosis not present

## 2021-10-19 DIAGNOSIS — M8448XD Pathological fracture, other site, subsequent encounter for fracture with routine healing: Secondary | ICD-10-CM | POA: Diagnosis not present

## 2021-10-19 DIAGNOSIS — N1831 Chronic kidney disease, stage 3a: Secondary | ICD-10-CM | POA: Diagnosis not present

## 2021-10-19 DIAGNOSIS — F39 Unspecified mood [affective] disorder: Secondary | ICD-10-CM | POA: Diagnosis not present

## 2021-10-19 NOTE — Patient Instructions (Signed)

## 2021-10-19 NOTE — Progress Notes (Signed)
Cardiology Office Note:    Date:  10/19/2021   ID:  Robin Logan, DOB 1936-02-23, MRN [id number]  PCP:  Venia Carbon, MD  [facility] Cardiologist:  Kate Sable, MD  [place] Electrophysiologist:  None   Referring MD: Venia Carbon, MD   Chief Complaint  Patient presents with   Other    No complaints today. Meds reviewed verbally with pt.    History of Present Illness:    Robin Logan is a 86 y.o. female with a hx of hyperlipidemia, paroxysmal SVT, TIA, COPD, former smoker, spinal stenosis who presents for hospital follow-up.   Admitted 2 months ago due to upper and lower extremity weakness.  Work-up with MRI did not reveal any new infarct.  Evaluated by neurology, optimized from a stroke standpoint.  Advised to continue Plavix and atorvastatin 80 mg daily as prescribed.  Did take aspirin x3 weeks before stopping.  She is tolerating all medications as prescribed, denies palpitations.  Takes Toprol-XL 12.5 mg daily.  Complains of low back pain attributed to spinal stenosis.   Prior notes Cardiac monitor: 08/27/2021 showed paroxysmal supraventricular tachycardia runs, no evidence for atrial fibrillation or atrial flutter. Echocardiogram 07/2023, normal systolic function, EF 60 to 65%. Ultrasound 11/2019 bilateral arthrosclerosis plaque right greater than left.  No hemodynamically significant stenosis. Admitted to the hospital with TIA, telemetry monitoring showed SVT heart rates up to 149, Cardizem was administered with resolution.    Past Medical History:  Diagnosis Date   Arthritis    Depression    Hyperlipidemia    Osteopenia    Osteopenia    Sleep disorder    Thyroid disease     Past Surgical History:  Procedure Laterality Date   ABDOMINAL HYSTERECTOMY     APPENDECTOMY     CATARACT EXTRACTION W/PHACO Right 03/03/2019   Procedure: CATARACT EXTRACTION PHACO AND INTRAOCULAR LENS PLACEMENT (IOC) RIGHT  00:44.0  16.1%  7.12;  Surgeon:  Leandrew Koyanagi, MD;  Location: [place];  Service: Ophthalmology;  Laterality: Right;   CATARACT EXTRACTION W/PHACO Left 03/24/2019   Procedure: CATARACT EXTRACTION PHACO AND INTRAOCULAR LENS PLACEMENT (IOC) LEFT  01:09.4  12.5%  8.73;  Surgeon: Leandrew Koyanagi, MD;  Location: [place];  Service: Ophthalmology;  Laterality: Left;   TONSILLECTOMY AND ADENOIDECTOMY  1946    Current Medications: Current Meds  Medication Sig   Ascorbic Acid (VITAMIN C) 1000 MG tablet Take 1,000 mg by mouth daily.   atorvastatin (LIPITOR) 80 MG tablet Take 1 tablet (80 mg total) by mouth daily.   cholecalciferol (VITAMIN D) 1000 UNITS tablet Take 1,000 Units by mouth daily.   clopidogrel (PLAVIX) 75 MG tablet Take 1 tablet (75 mg total) by mouth daily.   HYDROcodone-acetaminophen (NORCO/VICODIN) 5-325 MG tablet Take 1 tablet by mouth every 6 (six) hours as needed for severe pain.   levothyroxine (SYNTHROID) 88 MCG tablet TAKE 1 TABLET DAILY   metoprolol succinate (TOPROL-XL) 50 MG 24 hr tablet Take 0.5 tablets (25 mg total) by mouth daily. Take with or immediately following a meal.   mirabegron ER (MYRBETRIQ) 50 MG TB24 tablet Take 1 tablet (50 mg total) by mouth daily.   mirtazapine (REMERON) 45 MG tablet Take 1 tablet (45 mg total) by mouth at bedtime.   Multiple Vitamins-Minerals (ICAPS AREDS 2 PO) Take by mouth.   Polyethylene Glycol 3350 (MIRALAX PO) Take by mouth.     Allergies:   Patient has no known allergies.   Social History  Socioeconomic History   Marital status: Married    Spouse name: Not on file   Number of children: Not on file   Years of education: Not on file   Highest education level: Not on file  Occupational History   Occupation: retired- Production manager of [employer]  Tobacco Use   Smoking status: Former    Types: Cigarettes    Quit date: 06/03/1978    Years since quitting: 43.4    Passive exposure: Past   Smokeless tobacco: Never   Vaping Use   Vaping Use: Never used  Substance and Sexual Activity   Alcohol use: Yes    Alcohol/week: 0.0 standard drinks    Comment: wine   Drug use: No   Sexual activity: Not on file  Other Topics Concern   Not on file  Social History Narrative   Requests DNR --order done 12/24/10. Currently keeping it in draw, would accept resuscitation for now   Has living will   Husband is health care POA---alternate would be nieces (both sides)   No tube feedings if cognitively unaware   Social Determinants of Health   Financial Resource Strain: Not on file  Food Insecurity: Not on file  Transportation Needs: Not on file  Physical Activity: Not on file  Stress: Not on file  Social Connections: Not on file     Family History: The patient's family history includes Diabetes in her brother; Hypertension in her father; Stroke in her brother. There is no history of Cancer.  ROS:   Please see the history of present illness.     All other systems reviewed and are negative.  EKGs/Labs/Other Studies Reviewed:    The following studies were reviewed today:   EKG:  EKG is  ordered today.  The ekg ordered today demonstrates normal sinus rhythm  Recent Labs: 07/27/2021: TSH 6.347 08/15/2021: ALT 24; BUN 25; Creatinine, Ser 1.08; Hemoglobin 12.7; Platelets 454; Potassium 4.3; Sodium 129  Recent Lipid Panel    Component Value Date/Time   CHOL 173 07/28/2021 0447   TRIG 56 07/28/2021 0447   HDL 71 07/28/2021 0447   CHOLHDL 2.4 07/28/2021 0447   VLDL 11 07/28/2021 0447   LDLCALC 91 07/28/2021 0447   LDLDIRECT 161.5 11/30/2012 1553    Physical Exam:    VS:  BP 124/70 (BP Location: Left Arm, Patient Position: Sitting, Cuff Size: Normal)   Pulse 72   Ht '5\' 4"'$  (1.626 m)   Wt 132 lb 4 oz (60 kg)   SpO2 93%   BMI 22.70 kg/m     Wt Readings from Last 3 Encounters:  10/19/21 132 lb 4 oz (60 kg)  10/02/21 130 lb (59 kg)  08/31/21 128 lb (58.1 kg)     GEN:  Well nourished, well  developed in no acute distress HEENT: Normal NECK: No JVD; No carotid bruits LYMPHATICS: No lymphadenopathy CARDIAC: RRR, no murmurs, rubs, gallops RESPIRATORY:  Clear to auscultation without rales, wheezing or rhonchi  ABDOMEN: Soft, non-tender, non-distended MUSCULOSKELETAL:  No edema; low back tenderness. SKIN: Warm and dry NEUROLOGIC:  Alert and oriented x 3 PSYCHIATRIC:  Normal affect   ASSESSMENT:    1. Paroxysmal SVT (supraventricular tachycardia) ([place])   2. TIA (transient ischemic attack)      PLAN:    In order of problems listed above:  paroxysmal SVT.  Currently in sinus rhythm.  Denies palpitations, continue Toprol-XL 25 mg daily.  Last echocardiogram showed normal systolic function, EF 60 to 65%. History  of CVA.  Continue Plavix, Lipitor as prescribed.  Follow-up yearly.   Medication Adjustments/Labs and Tests Ordered: Current medicines are reviewed at length with the patient today.  Concerns regarding medicines are outlined above.  Orders Placed This Encounter  Procedures   EKG 12-Lead    No orders of the defined types were placed in this encounter.    Patient Instructions  Medication Instructions:  Your physician recommends that you continue on your current medications as directed. Please refer to the Current Medication list given to you today.  *If you need a refill on your cardiac medications before your next appointment, please call your pharmacy*   Lab Work: None ordered If you have labs (blood work) drawn today and your tests are completely normal, you will receive your results only by: [place] (if you have MyChart) OR A paper copy in the mail If you have any lab test that is abnormal or we need to change your treatment, we will call you to review the results.   Testing/Procedures: None ordered   Follow-Up: At [facility], you and your health needs are our priority.  As part of our continuing mission to provide you with  exceptional heart care, we have created designated Provider Care Teams.  These Care Teams include your primary Cardiologist (physician) and Advanced Practice Providers (APPs -  Physician Assistants and Nurse Practitioners) who all work together to provide you with the care you need, when you need it.  We recommend signing up for the patient portal called "MyChart".  Sign up information is provided on this After Visit Summary.  MyChart is used to connect with patients for Virtual Visits (Telemedicine).  Patients are able to view lab/test results, encounter notes, upcoming appointments, etc.  Non-urgent messages can be sent to your provider as well.   To learn more about what you can do with MyChart, go to [email or web address].    Your next appointment:   1 year(s)  The format for your next appointment:   In Person  Provider:   You may see Kate Sable, MD or one of the following Advanced Practice Providers on your designated Care Team:   Murray Hodgkins, NP Christell Faith, PA-C Cadence Kathlen Mody, [place]    Other Instructions   Important Information About Sugar         Signed, Kate Sable, MD  10/19/2021 2:55 PM    [place]

## 2021-10-22 DIAGNOSIS — N1831 Chronic kidney disease, stage 3a: Secondary | ICD-10-CM | POA: Diagnosis not present

## 2021-10-22 DIAGNOSIS — M48061 Spinal stenosis, lumbar region without neurogenic claudication: Secondary | ICD-10-CM | POA: Diagnosis not present

## 2021-10-22 DIAGNOSIS — I471 Supraventricular tachycardia: Secondary | ICD-10-CM | POA: Diagnosis not present

## 2021-10-22 DIAGNOSIS — I69398 Other sequelae of cerebral infarction: Secondary | ICD-10-CM | POA: Diagnosis not present

## 2021-10-22 DIAGNOSIS — F39 Unspecified mood [affective] disorder: Secondary | ICD-10-CM | POA: Diagnosis not present

## 2021-10-22 DIAGNOSIS — M8448XD Pathological fracture, other site, subsequent encounter for fracture with routine healing: Secondary | ICD-10-CM | POA: Diagnosis not present

## 2021-10-23 ENCOUNTER — Encounter: Payer: Self-pay | Admitting: Internal Medicine

## 2021-10-23 ENCOUNTER — Ambulatory Visit (INDEPENDENT_AMBULATORY_CARE_PROVIDER_SITE_OTHER): Payer: Medicare Other | Admitting: Internal Medicine

## 2021-10-23 DIAGNOSIS — M810 Age-related osteoporosis without current pathological fracture: Secondary | ICD-10-CM | POA: Insufficient documentation

## 2021-10-23 DIAGNOSIS — M8000XA Age-related osteoporosis with current pathological fracture, unspecified site, initial encounter for fracture: Secondary | ICD-10-CM | POA: Diagnosis not present

## 2021-10-23 DIAGNOSIS — I639 Cerebral infarction, unspecified: Secondary | ICD-10-CM | POA: Diagnosis not present

## 2021-10-23 DIAGNOSIS — S32110G Nondisplaced Zone I fracture of sacrum, subsequent encounter for fracture with delayed healing: Secondary | ICD-10-CM | POA: Diagnosis not present

## 2021-10-23 MED ORDER — RISEDRONATE SODIUM 150 MG PO TABS: 150.0000 mg | ORAL_TABLET | ORAL | 3 refills | Status: DC

## 2021-10-23 NOTE — Assessment & Plan Note (Signed)
Some ongoing pain Still needs the hydrocodone 1-2 daily

## 2021-10-23 NOTE — Assessment & Plan Note (Signed)
With fracture and ongoing pain Discussed vitamin D and calcium (is on) Regular weight bearing exercise is limited as she recovers Will start actonel 150 monthly---discussed precautions. Will plan 5 years of Rx (2028)

## 2021-10-23 NOTE — Progress Notes (Signed)
Subjective:    Patient ID: Robin Logan, female    DOB: [birth year], 86 y.o.   MRN: [id number]  HPI Here to discuss osteoporosis rx  Reviewed my concerns due to the sacral fracture Does have osteoporosis on the DEXA  Finished with PT Still has pain--uses the hydrocodone at times  Current Outpatient Medications on File Prior to Visit  Medication Sig Dispense Refill   Ascorbic Acid (VITAMIN C) 1000 MG tablet Take 1,000 mg by mouth daily.     cholecalciferol (VITAMIN D) 1000 UNITS tablet Take 1,000 Units by mouth daily.     clopidogrel (PLAVIX) 75 MG tablet Take 1 tablet (75 mg total) by mouth daily. 90 tablet 0   HYDROcodone-acetaminophen (NORCO/VICODIN) 5-325 MG tablet Take 1 tablet by mouth every 6 (six) hours as needed for severe pain. 60 tablet 0   levothyroxine (SYNTHROID) 88 MCG tablet TAKE 1 TABLET DAILY 90 tablet 3   mirabegron ER (MYRBETRIQ) 50 MG TB24 tablet Take 1 tablet (50 mg total) by mouth daily. 90 tablet 3   mirtazapine (REMERON) 45 MG tablet Take 1 tablet (45 mg total) by mouth at bedtime. 90 tablet 3   Multiple Vitamins-Minerals (ICAPS AREDS 2 PO) Take by mouth.     Polyethylene Glycol 3350 (MIRALAX PO) Take by mouth.     atorvastatin (LIPITOR) 80 MG tablet Take 1 tablet (80 mg total) by mouth daily. 90 tablet 0   metoprolol succinate (TOPROL-XL) 50 MG 24 hr tablet Take 0.5 tablets (25 mg total) by mouth daily. Take with or immediately following a meal. 90 tablet 2   No current facility-administered medications on file prior to visit.    No Known Allergies  Past Medical History:  Diagnosis Date   Arthritis    Depression    Hyperlipidemia    Osteopenia    Osteopenia    Sleep disorder    Thyroid disease     Past Surgical History:  Procedure Laterality Date   ABDOMINAL HYSTERECTOMY     APPENDECTOMY     CATARACT EXTRACTION W/PHACO Right 03/03/2019   Procedure: CATARACT EXTRACTION PHACO AND INTRAOCULAR LENS PLACEMENT (IOC) RIGHT  00:44.0  16.1%  7.12;   Surgeon: Leandrew Koyanagi, MD;  Location: [facility];  Service: Ophthalmology;  Laterality: Right;   CATARACT EXTRACTION W/PHACO Left 03/24/2019   Procedure: CATARACT EXTRACTION PHACO AND INTRAOCULAR LENS PLACEMENT (IOC) LEFT  01:09.4  12.5%  8.73;  Surgeon: Leandrew Koyanagi, MD;  Location: [place];  Service: Ophthalmology;  Laterality: Left;   TONSILLECTOMY AND ADENOIDECTOMY  1946    Family History  Problem Relation Age of Onset   Hypertension Father    Stroke Brother    Diabetes Brother    Cancer Neg Hx        no breast or colon    Social History   Socioeconomic History   Marital status: Married    Spouse name: Not on file   Number of children: Not on file   Years of education: Not on file   Highest education level: Not on file  Occupational History   Occupation: retired- Production manager of [employer]  Tobacco Use   Smoking status: Former    Types: Cigarettes    Quit date: 06/03/1978    Years since quitting: 43.4    Passive exposure: Past   Smokeless tobacco: Never  Vaping Use   Vaping Use: Never used  Substance and Sexual Activity   Alcohol use: Yes    Alcohol/week:  0.0 standard drinks    Comment: wine   Drug use: No   Sexual activity: Not on file  Other Topics Concern   Not on file  Social History Narrative   Requests DNR --order done 12/24/10. Currently keeping it in draw, would accept resuscitation for now   Has living will   Husband is health care POA---alternate would be nieces (both sides)   No tube feedings if cognitively unaware   Social Determinants of Health   Financial Resource Strain: Not on file  Food Insecurity: Not on file  Transportation Needs: Not on file  Physical Activity: Not on file  Stress: Not on file  Social Connections: Not on file  Intimate Partner Violence: Not on file   Review of Systems Has done regular yoga---but not back to that yet Still not sleeping great--despite the remeron      Objective:   Physical Exam Constitutional:      Appearance: Normal appearance.  Neurological:     Mental Status: She is alert.           Assessment & Plan:

## 2021-10-24 DIAGNOSIS — M48061 Spinal stenosis, lumbar region without neurogenic claudication: Secondary | ICD-10-CM | POA: Diagnosis not present

## 2021-10-24 DIAGNOSIS — F39 Unspecified mood [affective] disorder: Secondary | ICD-10-CM | POA: Diagnosis not present

## 2021-10-24 DIAGNOSIS — M8448XD Pathological fracture, other site, subsequent encounter for fracture with routine healing: Secondary | ICD-10-CM | POA: Diagnosis not present

## 2021-10-24 DIAGNOSIS — I471 Supraventricular tachycardia: Secondary | ICD-10-CM | POA: Diagnosis not present

## 2021-10-24 DIAGNOSIS — I69398 Other sequelae of cerebral infarction: Secondary | ICD-10-CM | POA: Diagnosis not present

## 2021-10-24 DIAGNOSIS — N1831 Chronic kidney disease, stage 3a: Secondary | ICD-10-CM | POA: Diagnosis not present

## 2021-10-30 DIAGNOSIS — Z23 Encounter for immunization: Secondary | ICD-10-CM | POA: Diagnosis not present

## 2021-11-06 ENCOUNTER — Encounter: Payer: Self-pay | Admitting: Internal Medicine

## 2021-11-06 MED ORDER — HYDROCODONE-ACETAMINOPHEN 5-325 MG PO TABS: 1.0000 | ORAL_TABLET | Freq: Four times a day (QID) | ORAL | 0 refills | Status: DC | PRN

## 2021-11-06 NOTE — Telephone Encounter (Signed)
Last office visit 10/23/21 for discuss osteoporosis options.   Last refilled 10/09/2021 for #60 with no refills.   CPE scheduled 01/29/2022.   

## 2021-11-07 DIAGNOSIS — I700 Atherosclerosis of aorta: Secondary | ICD-10-CM | POA: Diagnosis not present

## 2021-11-07 DIAGNOSIS — M8448XG Pathological fracture, other site, subsequent encounter for fracture with delayed healing: Secondary | ICD-10-CM | POA: Diagnosis not present

## 2021-11-11 ENCOUNTER — Encounter: Payer: Self-pay | Admitting: Internal Medicine

## 2021-11-12 ENCOUNTER — Encounter: Payer: Self-pay | Admitting: Internal Medicine

## 2021-11-12 ENCOUNTER — Other Ambulatory Visit: Payer: Self-pay | Admitting: Physician Assistant

## 2021-11-12 DIAGNOSIS — M8448XG Pathological fracture, other site, subsequent encounter for fracture with delayed healing: Secondary | ICD-10-CM

## 2021-11-12 MED ORDER — HYDROCODONE-ACETAMINOPHEN 5-325 MG PO TABS
1.0000 | ORAL_TABLET | Freq: Four times a day (QID) | ORAL | 0 refills | Status: DC | PRN
Start: 2021-11-12 — End: 2021-11-13

## 2021-11-12 NOTE — Telephone Encounter (Signed)
Patient states that hyrdocodone was [facility] and needs to be sent to [facility] .

## 2021-11-12 NOTE — Telephone Encounter (Signed)
Yes, I am aware. This is a controlled substance and can only be done by Dr Silvio Pate when he gets in the office later today.

## 2021-11-12 NOTE — Telephone Encounter (Signed)
I just did it how it was set up. Let her know I sent it locally and she should have enough for 2 months now

## 2021-11-13 ENCOUNTER — Ambulatory Visit
Admission: RE | Admit: 2021-11-13 | Discharge: 2021-11-13 | Disposition: A | Discharge status: Home / Self Care | Payer: Medicare Other | Source: Ambulatory Visit | Attending: Physician Assistant | Admitting: Physician Assistant

## 2021-11-13 DIAGNOSIS — M8448XG Pathological fracture, other site, subsequent encounter for fracture with delayed healing: Secondary | ICD-10-CM

## 2021-11-13 DIAGNOSIS — S3210XA Unspecified fracture of sacrum, initial encounter for closed fracture: Secondary | ICD-10-CM | POA: Diagnosis not present

## 2021-11-13 DIAGNOSIS — H40003 Preglaucoma, unspecified, bilateral: Secondary | ICD-10-CM | POA: Diagnosis not present

## 2021-11-13 DIAGNOSIS — M5416 Radiculopathy, lumbar region: Secondary | ICD-10-CM | POA: Diagnosis not present

## 2021-11-13 HISTORY — DX: Other complications of anesthesia, initial encounter: T88.59XA

## 2021-11-13 MED ORDER — HYDROCODONE-ACETAMINOPHEN 5-325 MG PO TABS
1.0000 | ORAL_TABLET | Freq: Four times a day (QID) | ORAL | 0 refills | Status: DC | PRN
Start: 2021-11-13 — End: 2022-01-18

## 2021-11-13 NOTE — Consult Note (Signed)
Chief Complaint: Patient was seen in consultation today for low back pain and right leg pain at the request of Wolfe,Jon R  Referring Physician(s): Wolfe,Jon R  History of Present Illness: Robin Logan is a 86 y.o. female who presents with approximately 3 months of low back pain and right lower extremity radiculopathy.  Her symptoms began when she started to experience weakness, numbness and tingling in the extremities.  She underwent 3 emergency room visits over a short course of time and was diagnosed with transient ischemic attacks.  The day after being discharged from the emergency room on her third visit she woke up spontaneously in the middle of the night with severe low back pain.  Subsequent work-up including an MRI of the thoracic and lumbar spine dated 08/15/2021 revealed an S2 sacral fracture with extension into the bilateral sacral ala.  Additionally, she has advanced degenerative spondylosis including progressive mild to moderate right neuroforaminal stenosis at L4-L5 and moderate to severe foraminal stenosis on the right at L5-S1.  She describes her symptoms as centered in her lower back from the waist down.  Additionally, she has a throbbing pain along the anterior aspect of her right shin along the distribution of the L5 nerve dermatome.  She is currently taking hydrocodone which provides some control of her symptoms.  She takes the hydrocodone at least twice daily and is unable to go without it.  She rates her pain a 7 out of 10 without hydrocodone and a 4 out of 10 with the hydrocodone.  Her symptoms are also debilitating.  She scored a 16 out of 24 on the [employer] disability questionnaire.  She states "I just want to get my life back".  She feels unable to perform as actively as she was prior to this episode.  In particular, she misses attending her yoga classes.  Past Medical History:  Diagnosis Date   Arthritis    Depression    Hyperlipidemia    Osteopenia     Osteopenia    Sleep disorder    Thyroid disease     Past Surgical History:  Procedure Laterality Date   ABDOMINAL HYSTERECTOMY     APPENDECTOMY     CATARACT EXTRACTION W/PHACO Right 03/03/2019   Procedure: CATARACT EXTRACTION PHACO AND INTRAOCULAR LENS PLACEMENT (IOC) RIGHT  00:44.0  16.1%  7.12;  Surgeon: Leandrew Koyanagi, MD;  Location: [place];  Service: Ophthalmology;  Laterality: Right;   CATARACT EXTRACTION W/PHACO Left 03/24/2019   Procedure: CATARACT EXTRACTION PHACO AND INTRAOCULAR LENS PLACEMENT (IOC) LEFT  01:09.4  12.5%  8.73;  Surgeon: Leandrew Koyanagi, MD;  Location: [place];  Service: Ophthalmology;  Laterality: Left;   TONSILLECTOMY AND ADENOIDECTOMY  1946    Allergies: Patient has no known allergies.  Medications: Prior to Admission medications   Medication Sig Start Date End Date Taking? Authorizing Provider  Ascorbic Acid (VITAMIN C) 1000 MG tablet Take 1,000 mg by mouth daily.   Yes [provider]  cholecalciferol (VITAMIN D) 1000 UNITS tablet Take 1,000 Units by mouth daily.   Yes [provider]  HYDROcodone-acetaminophen (NORCO/VICODIN) 5-325 MG tablet Take 1 tablet by mouth every 6 (six) hours as needed for severe pain. 11/13/21  Yes Venia Carbon, MD  levothyroxine (SYNTHROID) 88 MCG tablet TAKE 1 TABLET DAILY 03/26/21  Yes Venia Carbon, MD  metoprolol succinate (TOPROL-XL) 50 MG 24 hr tablet Take 0.5 tablets (25 mg total) by mouth daily. Take with or immediately following a  meal. 01/04/21 11/13/21 Yes Agbor-Etang, Aaron Edelman, MD  mirabegron ER (MYRBETRIQ) 50 MG TB24 tablet Take 1 tablet (50 mg total) by mouth daily. 05/29/21  Yes Venia Carbon, MD  mirtazapine (REMERON) 45 MG tablet Take 1 tablet (45 mg total) by mouth at bedtime. 02/08/21  Yes Venia Carbon, MD  Multiple Vitamins-Minerals (ICAPS AREDS 2 PO) Take by mouth.   Yes [provider]  Polyethylene Glycol 3350 (MIRALAX PO) Take by mouth.    Yes [provider]  risedronate (ACTONEL) 150 MG tablet Take 1 tablet (150 mg total) by mouth every 30 (thirty) days. with water on empty stomach, nothing by mouth or lie down for next 30 minutes. 10/23/21  Yes Venia Carbon, MD  atorvastatin (LIPITOR) 80 MG tablet Take 1 tablet (80 mg total) by mouth daily. 08/15/21 10/19/21  Venia Carbon, MD     Family History  Problem Relation Age of Onset   Hypertension Father    Stroke Brother    Diabetes Brother    Cancer Neg Hx        no breast or colon    Social History   Socioeconomic History   Marital status: Married    Spouse name: Not on file   Number of children: Not on file   Years of education: Not on file   Highest education level: Not on file  Occupational History   Occupation: retired- Production manager of [employer]  Tobacco Use   Smoking status: Former    Types: Cigarettes    Quit date: 06/03/1978    Years since quitting: 43.4    Passive exposure: Past   Smokeless tobacco: Never  Vaping Use   Vaping Use: Never used  Substance and Sexual Activity   Alcohol use: Yes    Alcohol/week: 0.0 standard drinks of alcohol    Comment: wine   Drug use: No   Sexual activity: Not on file  Other Topics Concern   Not on file  Social History Narrative   Requests DNR --order done 12/24/10. Currently keeping it in draw, would accept resuscitation for now   Has living will   Husband is health care POA---alternate would be nieces (both sides)   No tube feedings if cognitively unaware   Social Determinants of Health   Financial Resource Strain: Not on file  Food Insecurity: Not on file  Transportation Needs: Not on file  Physical Activity: Not on file  Stress: Not on file  Social Connections: Not on file    Review of Systems: A 12 point ROS discussed and pertinent positives are indicated in the HPI above.  All other systems are negative.  Review of Systems  Vital Signs: BP 113/67 (BP Location: Left  Arm, Patient Position: Sitting, Cuff Size: Normal)   Pulse 66   Temp 97.8 F (36.6 C) (Oral)   Resp 16   Wt 58.5 kg   SpO2 95%   BMI 22.14 kg/m   Physical Exam Constitutional:      General: She is not in acute distress.    Appearance: Normal appearance.  HENT:     Head: Normocephalic and atraumatic.  Eyes:     General: No scleral icterus. Cardiovascular:     Rate and Rhythm: Normal rate.  Pulmonary:     Effort: Pulmonary effort is normal.  Skin:    General: Skin is warm and dry.  Neurological:     Mental Status: She is alert and oriented to person, place, and time.  Psychiatric:        Mood and Affect: Mood normal.        Behavior: Behavior normal.     Imaging: DG Bone Density  Result Date: 10/17/2021 EXAM: DUAL X-RAY ABSORPTIOMETRY (DXA) FOR BONE MINERAL DENSITY IMPRESSION: Your patient Robin Logan completed a BMD test on 10/17/2021 using the [employer]ak (software version: 14.10) manufactured by [employer]. The following summarizes the results of our evaluation. Technologist: [facility] PATIENT BIOGRAPHICAL: Name: Robin Logan, Robin Logan Patient ID: [id number] Birth Date: 1935/12/03 Height: 62.0 in. Gender: Female Exam Date: 10/17/2021 Weight: 131.0 lbs. Indications: Advanced Age, Caucasian, Hypothyroid, Postmenopausal, Scoliosis Fractures: sacrum, Spinal compression fx Treatments: Synthroid, Vitamin D DENSITOMETRY RESULTS: Site         Region      Measured Date Measured Age WHO Classification Young Adult T-score BMD         %Change vs. Previous Significant Change (*) DualFemur Total Right 10/17/20[age].8 Osteoporosis -2.5 0.697 g/cm2 Left Forearm Radius 33% 10/17/2023 83.8 Osteoporosis -3.3 0.584 g/cm2 ASSESSMENT: The BMD measured at Forearm Radius 33% is 0.584 g/cm2 with a T-score of -3.3. This patient is considered osteoporotic according to [facility]) criteria. The scan quality is good. Lumbar spine was not utilized due to scoliosis. World  Pharmacologist [facility]) criteria for post-menopausal, Caucasian Women: Normal:                   T-score at or above -1 SD Osteopenia/low bone mass: T-score between -1 and -2.5 SD Osteoporosis:             T-score at or below -2.5 SD RECOMMENDATIONS: 1. All patients should optimize calcium and vitamin D intake. 2. Consider FDA-approved medical therapies in postmenopausal women and men aged 22 years and older, based on the following: a. A hip or vertebral(clinical or morphometric) fracture b. T-score < -2.5 at the femoral neck or spine after appropriate evaluation to exclude secondary causes c. Low bone mass (T-score between -1.0 and -2.5 at the femoral neck or spine) and a 10-year probability of a hip fracture > 3% or a 10-year probability of a major osteoporosis-related fracture > 20% based on the [place]-adapted WHO algorithm 3. Clinician judgment and/or patient preferences may indicate treatment for people with 10-year fracture probabilities above or below these levels FOLLOW-UP: People with diagnosed cases of osteoporosis or at high risk for fracture should have regular bone mineral density tests. For patients eligible for Medicare, routine testing is allowed once every 2 years. The testing frequency can be increased to one year for patients who have rapidly progressing disease, those who are receiving or discontinuing medical therapy to restore bone mass, or have additional risk factors. I have reviewed this report, and agree with the above findings. [facility] Electronically Signed   By: Elmer Picker M.D.   On: 10/17/2021 15:18    Labs:  CBC: Recent Labs    01/23/21 1130 07/27/21 1201 08/03/21 2145 08/15/21 0803  WBC 4.2 9.3 9.0 13.7*  HGB 12.2 12.2 11.4* 12.7  HCT 36.9 37.4 34.3* 37.7  PLT 201.0 268 287 454*    COAGS: Recent Labs    07/27/21 1201 08/03/21 2145  INR 0.9 0.9  APTT 34 32    BMP: Recent Labs    07/28/21 0127 07/28/21 0447 08/03/21 2145  08/15/21 0803  NA 128* 128* 128* 129*  K 4.3 4.4 4.6 4.3  CL 96* 94* 93* 96*  CO2 '22 24 23 '$ 23  GLUCOSE 87 89 117* 113*  BUN '16 13 23 '$ 25*  CALCIUM 8.8* 8.9 9.0 9.0  CREATININE 0.73 0.72 0.84 1.08*  GFRNONAA >60 >60 >60 51*    LIVER FUNCTION TESTS: Recent Labs    01/23/21 1130 07/27/21 1201 08/03/21 2145 08/15/21 0803  BILITOT 0.5 0.6 0.3 0.8  AST 26 38 31 23  ALT '23 30 30 24  '$ ALKPHOS 74 75 74 119  PROT 6.9 7.1 6.9 6.9  ALBUMIN 4.3  4.3 4.1 3.8 3.5    TUMOR MARKERS: No results for input(s): "AFPTM", "CEA", "CA199", "CHROMGRNA" in the last 8760 hours.  Assessment and Plan:  Pleasant 86 year old female with a subacute sacral fracture as well as significant right L5-S1 neuroforaminal stenosis and a right L5 radiculopathy.  Unfortunately, she is also on Plavix due to symptoms of transient ischemic attack recently.  She is currently not able to come off the Plavix.  Pursuing both sacroplasty and epidural steroid injection while on Plavix carries an increased risk of bleeding complications.  At this time, her symptoms are somewhat controlled with hydrocodone.  After a long discussion, she would like to wait until she can come off the Plavix before pursuing any intervention.  It is possible her sacral fracture may also heal during that time.  1.)  I will have my staff reach out to her physician who prescribed her the Plavix to identify a date at which point it may be safe for her to come off Plavix prior to an intervention. 2.)  We will then reach out to Robin Logan and if she remains symptomatic bring her in to discuss further treatment with possible sacral plasty and/or right L5-S1 epidural steroid injection.  Thank you for this interesting consult.  I greatly enjoyed meeting Robin Logan and look forward to participating in their care.  A copy of this report was sent to the requesting provider on this date.  Electronically Signed: Criselda Peaches 11/13/2021, 4:04  PM   I spent a total of  30 Minutes  in face to face in clinical consultation, greater than 50% of which was counseling/coordinating care for sacral fracture and right lower extremity L5 radiculopathy.

## 2021-11-13 NOTE — Telephone Encounter (Signed)
Spoke to Pt. She is supposed to be getting the hydrocodone Pad[place] sent last week to [facility] later this week. She is needing a small amount of hydrocodone sent to [facility] until the mail order comes. [facility] would not fill the rx sent yesterday to them because insurance had already paid for the [facility] rx. She will pay out of pocket if needed.

## 2021-11-19 DIAGNOSIS — B351 Tinea unguium: Secondary | ICD-10-CM | POA: Diagnosis not present

## 2021-11-19 DIAGNOSIS — M79674 Pain in right toe(s): Secondary | ICD-10-CM | POA: Diagnosis not present

## 2021-11-19 DIAGNOSIS — M79675 Pain in left toe(s): Secondary | ICD-10-CM | POA: Diagnosis not present

## 2021-11-20 DIAGNOSIS — Z961 Presence of intraocular lens: Secondary | ICD-10-CM | POA: Diagnosis not present

## 2021-11-20 DIAGNOSIS — H40003 Preglaucoma, unspecified, bilateral: Secondary | ICD-10-CM | POA: Diagnosis not present

## 2021-11-20 DIAGNOSIS — H43813 Vitreous degeneration, bilateral: Secondary | ICD-10-CM | POA: Diagnosis not present

## 2021-12-02 ENCOUNTER — Other Ambulatory Visit: Payer: Self-pay | Admitting: Family Medicine

## 2021-12-02 ENCOUNTER — Other Ambulatory Visit: Payer: Self-pay | Admitting: Internal Medicine

## 2021-12-06 ENCOUNTER — Encounter: Payer: Self-pay | Admitting: Internal Medicine

## 2021-12-06 MED ORDER — CLOPIDOGREL BISULFATE 75 MG PO TABS: 75.0000 mg | ORAL_TABLET | Freq: Every day | ORAL | 3 refills | Status: DC

## 2021-12-06 NOTE — Telephone Encounter (Signed)
I have looked in her chart and cannot see where it was stopped. It is not on her medication list. Will let Dr Silvio Pate advise.

## 2021-12-16 ENCOUNTER — Encounter (INDEPENDENT_AMBULATORY_CARE_PROVIDER_SITE_OTHER): Payer: Medicare Other | Admitting: Cardiology

## 2021-12-16 DIAGNOSIS — G459 Transient cerebral ischemic attack, unspecified: Secondary | ICD-10-CM

## 2021-12-17 ENCOUNTER — Encounter: Payer: Self-pay | Admitting: Internal Medicine

## 2021-12-17 NOTE — Telephone Encounter (Signed)
Please see the MyChart message reply(ies) for my assessment and plan.    This patient gave consent for this Medical Advice Message and is aware that it may result in a bill to their insurance company, as well as the possibility of receiving a bill for a co-payment or deductible. They are an established patient, but are not seeking medical advice exclusively about a problem treated during an in person or video visit in the last seven days. I did not recommend an in person or video visit within seven days of my reply.    I spent a total of 12 minutes cumulative time within 7 days through MyChart messaging.  Atilla Zollner Agbor-Etang, MD   

## 2021-12-20 ENCOUNTER — Other Ambulatory Visit: Payer: Self-pay | Admitting: *Deleted

## 2021-12-20 ENCOUNTER — Encounter: Payer: Self-pay | Admitting: Internal Medicine

## 2021-12-20 ENCOUNTER — Encounter: Payer: Self-pay | Admitting: Cardiology

## 2021-12-20 ENCOUNTER — Other Ambulatory Visit: Payer: Self-pay | Admitting: Internal Medicine

## 2021-12-20 MED ORDER — METOPROLOL SUCCINATE ER 25 MG PO TB24: 25.0000 mg | ORAL_TABLET | Freq: Every day | ORAL | 2 refills | Status: DC

## 2021-12-21 ENCOUNTER — Other Ambulatory Visit: Payer: Self-pay | Admitting: Interventional Radiology

## 2021-12-21 DIAGNOSIS — Z712 Person consulting for explanation of examination or test findings: Secondary | ICD-10-CM

## 2021-12-24 ENCOUNTER — Ambulatory Visit
Admission: RE | Admit: 2021-12-24 | Discharge: 2021-12-24 | Disposition: A | Discharge status: Home / Self Care | Payer: Medicare Other | Source: Ambulatory Visit | Attending: Interventional Radiology | Admitting: Interventional Radiology

## 2021-12-24 DIAGNOSIS — Z712 Person consulting for explanation of examination or test findings: Secondary | ICD-10-CM

## 2021-12-24 DIAGNOSIS — M4726 Other spondylosis with radiculopathy, lumbar region: Secondary | ICD-10-CM | POA: Diagnosis not present

## 2021-12-24 HISTORY — PX: IR RADIOLOGIST EVAL & MGMT: IMG5224

## 2021-12-24 NOTE — Progress Notes (Signed)
Chief Complaint: Patient was consulted remotely today (TeleHealth) for low back pain and right leg pain at the request of Kaaren Nass K.    Referring Physician(s): Watt Climes  History of Present Illness: Robin Logan is a 86 y.o. female who was initially seen on 11/13/2021 with approximately 3 months of low back pain and right lower extremity radiculopathy.  Her symptoms began when she started to experience weakness, numbness and tingling in the extremities.  She underwent 3 emergency room visits over a short course of time and was diagnosed with transient ischemic attacks.  The day after being discharged from the emergency room on her third visit she woke up spontaneously in the middle of the night with severe low back pain.  Subsequent work-up including an MRI of the thoracic and lumbar spine dated 08/15/2021 revealed an S2 sacral fracture with extension into the bilateral sacral ala.  Additionally, she has advanced degenerative spondylosis including progressive mild to moderate right neuroforaminal stenosis at L4-L5 and moderate to severe foraminal stenosis on the right at L5-S1.  She described her symptoms as centered in her lower back from the waist down. Additionally, she has a throbbing pain along the anterior aspect of her right shin along the distribution of the L5 nerve dermatome. She is currently taking hydrocodone which provides some control of her symptoms. She takes the hydrocodone at least twice daily and is unable to go without it. She rates her pain a 7 out of 10 without hydrocodone and a 4 out of 10 with the hydrocodone. Her symptoms are also debilitating. She scored a 16 out of 24 on the [employer] disability questionnaire.   At the time of our initial consultation, her cardiologist recommended that she not go off of her dual antiplatelet therapy with aspirin and Plavix.  She has now obtained clearance from her primary care physician to stop her Plavix and aspirin  long enough to undergo the and intervention.  I spoke to her and her husband over the telephone today.  She reports that her symptoms are completely un abated since our last visit.  She has experienced no relief or other evidence of healing over these past 6 weeks.    Past Medical History:  Diagnosis Date   Arthritis    Complication of anesthesia    Depression    Hyperlipidemia    Osteopenia    Osteopenia    Sleep disorder    Thyroid disease     Past Surgical History:  Procedure Laterality Date   ABDOMINAL HYSTERECTOMY     APPENDECTOMY     CATARACT EXTRACTION W/PHACO Right 03/03/2019   Procedure: CATARACT EXTRACTION PHACO AND INTRAOCULAR LENS PLACEMENT (IOC) RIGHT  00:44.0  16.1%  7.12;  Surgeon: Leandrew Koyanagi, MD;  Location: [facility];  Service: Ophthalmology;  Laterality: Right;   CATARACT EXTRACTION W/PHACO Left 03/24/2019   Procedure: CATARACT EXTRACTION PHACO AND INTRAOCULAR LENS PLACEMENT (IOC) LEFT  01:09.4  12.5%  8.73;  Surgeon: Leandrew Koyanagi, MD;  Location: [facility];  Service: Ophthalmology;  Laterality: Left;   TONSILLECTOMY AND ADENOIDECTOMY  1944    Allergies: Patient has no known allergies.  Medications: Prior to Admission medications   Medication Sig Start Date End Date Taking? Authorizing Provider  Ascorbic Acid (VITAMIN C) 1000 MG tablet Take 1,000 mg by mouth daily.    [provider]  atorvastatin (LIPITOR) 80 MG tablet TAKE 1 TABLET DAILY 12/03/21   Venia Carbon, MD  cholecalciferol (VITAMIN D)  1000 UNITS tablet Take 1,000 Units by mouth daily.    [provider]  clopidogrel (PLAVIX) 75 MG tablet Take 1 tablet (75 mg total) by mouth daily. 12/06/21   Venia Carbon, MD  HYDROcodone-acetaminophen (NORCO/VICODIN) 5-325 MG tablet Take 1 tablet by mouth every 6 (six) hours as needed for severe pain. 11/13/21   Venia Carbon, MD  levothyroxine (SYNTHROID) 88 MCG tablet TAKE 1 TABLET DAILY 03/26/21    Viviana Simpler I, MD  metoprolol succinate (TOPROL-XL) 25 MG 24 hr tablet Take 1 tablet (25 mg total) by mouth daily. Take with or immediately following a meal. 12/20/21 06/13/23  Kate Sable, MD  mirabegron ER (MYRBETRIQ) 50 MG TB24 tablet Take 1 tablet (50 mg total) by mouth daily. 05/29/21   Venia Carbon, MD  mirtazapine (REMERON) 45 MG tablet Take 1 tablet (45 mg total) by mouth at bedtime. 02/08/21   Venia Carbon, MD  Multiple Vitamins-Minerals (ICAPS AREDS 2 PO) Take by mouth.    [provider]  Polyethylene Glycol 3350 (MIRALAX PO) Take by mouth.    [provider]  risedronate (ACTONEL) 150 MG tablet Take 1 tablet (150 mg total) by mouth every 30 (thirty) days. with water on empty stomach, nothing by mouth or lie down for next 30 minutes. 10/23/21   Venia Carbon, MD     Family History  Problem Relation Age of Onset   Hypertension Father    Stroke Brother    Diabetes Brother    Cancer Neg Hx        no breast or colon    Social History   Socioeconomic History   Marital status: Married    Spouse name: Not on file   Number of children: Not on file   Years of education: Not on file   Highest education level: Not on file  Occupational History   Occupation: retired- Production manager of deeds [employer]  Tobacco Use   Smoking status: Former    Types: Cigarettes    Quit date: 06/03/1978    Years since quitting: 43.5    Passive exposure: Past   Smokeless tobacco: Never  Vaping Use   Vaping Use: Never used  Substance and Sexual Activity   Alcohol use: Yes    Alcohol/week: 0.0 standard drinks of alcohol    Comment: wine   Drug use: No   Sexual activity: Not on file  Other Topics Concern   Not on file  Social History Narrative   Requests DNR --order done 12/24/10. Currently keeping it in draw, would accept resuscitation for now   Has living will   Husband is health care POA---alternate would be nieces (both sides)   No tube feedings if  cognitively unaware   Social Determinants of Health   Financial Resource Strain: Not on file  Food Insecurity: Not on file  Transportation Needs: Not on file  Physical Activity: Not on file  Stress: Not on file  Social Connections: Not on file    Review of Systems  Review of Systems: A 12 point ROS discussed and pertinent positives are indicated in the HPI above.  All other systems are negative.    Physical Exam No direct physical exam was performed (except for noted visual exam findings with Video Visits).    Vital Signs: There were no vitals taken for this visit.  Imaging: No results found.  Labs:  CBC: Recent Labs    01/23/21 1130 07/27/21 1201 08/03/21 2145 08/15/21 5852  WBC 4.2 9.3 9.0 13.7*  HGB 12.2 12.2 11.4* 12.7  HCT 36.9 37.4 34.3* 37.7  PLT 201.0 268 287 454*    COAGS: Recent Labs    07/27/21 1201 08/03/21 2145  INR 0.9 0.9  APTT 34 32    BMP: Recent Labs    07/28/21 0127 07/28/21 0447 08/03/21 2145 08/15/21 0803  NA 128* 128* 128* 129*  K 4.3 4.4 4.6 4.3  CL 96* 94* 93* 96*  CO2 '22 24 23 23  '$ GLUCOSE 87 89 117* 113*  BUN '16 13 23 '$ 25*  CALCIUM 8.8* 8.9 9.0 9.0  CREATININE 0.73 0.72 0.84 1.08*  GFRNONAA >60 >60 >60 51*    LIVER FUNCTION TESTS: Recent Labs    01/23/21 1130 07/27/21 1201 08/03/21 2145 08/15/21 0803  BILITOT 0.5 0.6 0.3 0.8  AST 26 38 31 23  ALT '23 30 30 24  '$ ALKPHOS 74 75 74 119  PROT 6.9 7.1 6.9 6.9  ALBUMIN 4.3  4.3 4.1 3.8 3.5    TUMOR MARKERS: No results for input(s): "AFPTM", "CEA", "CA199", "CHROMGRNA" in the last 8760 hours.  Assessment and Plan:  Pleasant 86 year old female with a history of pathologic insufficiency fracture of the sacrum with delayed healing as well as a right L5 radiculopathy.  Unfortunately, her symptoms remain persistent without interval improvement since I saw her last nearly 6 weeks previously.  She has now cleared to come off her Plavix for the required duration to allow  for the sacral plasty and right L5-S1 epidural steroid injection.  Given her persistent symptoms, she likely has delayed healing and this is now her subsequent encounter.  She is an excellent candidate for cement augmentation with bilateral sacroplasty.  I also believe she would benefit from a right L5-S1 epidural steroid injection due to her persistent right lower extremity radiculopathy.    1.)  Please schedule for bilateral sacroplasty and concurrent right L5-S1 epidural steroid injection to be performed at [facility] on Tuesday 8/8.  Patient will need to stop her Plavix prior to the interventions.    Electronically Signed: Criselda Peaches 12/24/2021, 2:49 PM   I spent a total of  15 Minutes in remote  clinical consultation, greater than 50% of which was counseling/coordinating care for low back/sacral pain and right lower extremity radiculopathy.    Visit type: Audio only (telephone). Audio (no video) only due to patient preference. Alternative for in-person consultation at [facility], [address], [place], [place]. This visit type was conducted due to national recommendations for restrictions regarding the COVID-19 Pandemic (e.g. social distancing).  This format is felt to be most appropriate for this patient at this time.  All issues noted in this document were discussed and addressed.

## 2021-12-25 ENCOUNTER — Other Ambulatory Visit: Payer: Self-pay | Admitting: Interventional Radiology

## 2021-12-25 ENCOUNTER — Telehealth: Payer: Self-pay

## 2021-12-25 ENCOUNTER — Other Ambulatory Visit: Payer: Self-pay

## 2021-12-25 DIAGNOSIS — Z712 Person consulting for explanation of examination or test findings: Secondary | ICD-10-CM | POA: Diagnosis not present

## 2021-12-26 LAB — CBC
HCT: 36 % (ref 35.0–45.0)
Hemoglobin: 11.9 g/dL (ref 11.7–15.5)
MCH: 31.6 pg (ref 27.0–33.0)
MCHC: 33.1 g/dL (ref 32.0–36.0)
MCV: 95.5 fL (ref 80.0–100.0)
MPV: 9.9 fL (ref 7.5–12.5)
Platelets: 218 10*3/uL (ref 140–400)
RBC: 3.77 10*6/uL — ABNORMAL LOW (ref 3.80–5.10)
RDW: 11.5 % (ref 11.0–15.0)
WBC: 5 10*3/uL (ref 3.8–10.8)

## 2021-12-26 LAB — COMPLETE METABOLIC PANEL WITH GFR
AG Ratio: 1.6 (calc) (ref 1.0–2.5)
ALT: 36 U/L — ABNORMAL HIGH (ref 6–29)
AST: 31 U/L (ref 10–35)
Albumin: 4.1 g/dL (ref 3.6–5.1)
Alkaline phosphatase (APISO): 84 U/L (ref 37–153)
BUN/Creatinine Ratio: 31 (calc) — ABNORMAL HIGH (ref 6–22)
BUN: 29 mg/dL — ABNORMAL HIGH (ref 7–25)
CO2: 22 mmol/L (ref 20–32)
Calcium: 8.7 mg/dL (ref 8.6–10.4)
Chloride: 104 mmol/L (ref 98–110)
Creat: 0.94 mg/dL (ref 0.60–0.95)
Globulin: 2.5 g/dL (calc) (ref 1.9–3.7)
Glucose, Bld: 65 mg/dL (ref 65–99)
Potassium: 4.6 mmol/L (ref 3.5–5.3)
Sodium: 139 mmol/L (ref 135–146)
Total Bilirubin: 0.4 mg/dL (ref 0.2–1.2)
Total Protein: 6.6 g/dL (ref 6.1–8.1)
eGFR: 60 mL/min/{1.73_m2} (ref >=60)

## 2021-12-26 LAB — SPECIMEN COMPROMISED

## 2022-01-02 ENCOUNTER — Other Ambulatory Visit: Payer: Self-pay | Admitting: Physician Assistant

## 2022-01-02 DIAGNOSIS — M8448XG Pathological fracture, other site, subsequent encounter for fracture with delayed healing: Secondary | ICD-10-CM

## 2022-01-07 DIAGNOSIS — E854 Organ-limited amyloidosis: Secondary | ICD-10-CM | POA: Diagnosis not present

## 2022-01-07 DIAGNOSIS — G629 Polyneuropathy, unspecified: Secondary | ICD-10-CM | POA: Diagnosis not present

## 2022-01-07 DIAGNOSIS — M5417 Radiculopathy, lumbosacral region: Secondary | ICD-10-CM | POA: Diagnosis not present

## 2022-01-07 DIAGNOSIS — Z8673 Personal history of transient ischemic attack (TIA), and cerebral infarction without residual deficits: Secondary | ICD-10-CM | POA: Diagnosis not present

## 2022-01-07 DIAGNOSIS — M48061 Spinal stenosis, lumbar region without neurogenic claudication: Secondary | ICD-10-CM | POA: Diagnosis not present

## 2022-01-07 DIAGNOSIS — I680 Cerebral amyloid angiopathy: Secondary | ICD-10-CM | POA: Diagnosis not present

## 2022-01-07 DIAGNOSIS — I618 Other nontraumatic intracerebral hemorrhage: Secondary | ICD-10-CM | POA: Diagnosis not present

## 2022-01-08 ENCOUNTER — Ambulatory Visit
Admission: RE | Admit: 2022-01-08 | Discharge: 2022-01-08 | Disposition: A | Discharge status: Home / Self Care | Payer: Medicare Other | Source: Ambulatory Visit | Attending: Physician Assistant | Admitting: Physician Assistant

## 2022-01-08 DIAGNOSIS — M8448XG Pathological fracture, other site, subsequent encounter for fracture with delayed healing: Secondary | ICD-10-CM

## 2022-01-08 DIAGNOSIS — M8008XA Age-related osteoporosis with current pathological fracture, vertebra(e), initial encounter for fracture: Secondary | ICD-10-CM | POA: Diagnosis not present

## 2022-01-08 DIAGNOSIS — M4727 Other spondylosis with radiculopathy, lumbosacral region: Secondary | ICD-10-CM | POA: Diagnosis not present

## 2022-01-08 MED ORDER — FENTANYL CITRATE PF 50 MCG/ML IJ SOSY
25.0000 ug | PREFILLED_SYRINGE | INTRAMUSCULAR | Status: DC | PRN
  Administered 2022-01-08: 25 ug via INTRAVENOUS
  Administered 2022-01-08: 50 ug via INTRAVENOUS

## 2022-01-08 MED ORDER — MIDAZOLAM HCL 2 MG/2ML IJ SOLN
1.0000 mg | INTRAMUSCULAR | Status: DC | PRN
  Administered 2022-01-08 (×2): 1 mg via INTRAVENOUS

## 2022-01-08 MED ORDER — SODIUM CHLORIDE 0.9 % IV SOLN
INTRAVENOUS | Status: DC
Start: 2022-01-08 — End: 2022-01-09

## 2022-01-08 MED ORDER — IOPAMIDOL (ISOVUE-M 200) INJECTION 41%
1.0000 mL | Freq: Once | INTRAMUSCULAR | Status: AC
  Administered 2022-01-08: 1 mL via EPIDURAL

## 2022-01-08 MED ORDER — CEFAZOLIN SODIUM-DEXTROSE 2-4 GM/100ML-% IV SOLN
2.0000 g | INTRAVENOUS | Status: AC
  Administered 2022-01-08: 2 g via INTRAVENOUS

## 2022-01-08 MED ORDER — ACETAMINOPHEN 10 MG/ML IV SOLN
1000.0000 mg | Freq: Once | INTRAVENOUS | Status: AC
  Administered 2022-01-08: 1000 mg via INTRAVENOUS

## 2022-01-08 MED ORDER — METHYLPREDNISOLONE ACETATE 40 MG/ML INJ SUSP (RADIOLOG
80.0000 mg | Freq: Once | INTRAMUSCULAR | Status: AC
  Administered 2022-01-08: 80 mg via EPIDURAL

## 2022-01-08 NOTE — Discharge Instructions (Signed)
Post Procedure Spinal Discharge Instruction Sheet  You may resume a regular diet and any medications that you routinely take (including pain medications) unless otherwise noted by MD.  No driving day of procedure.  Light activity throughout the rest of the day.  Do not do any strenuous work, exercise, bending or lifting.  The day following the procedure, you can resume normal physical activity but you should refrain from exercising or physical therapy for at least three days thereafter.  You may apply ice to the injection site, 20 minutes on, 20 minutes off, as needed. Do not apply ice directly to skin.    Common Side Effects:  Headaches- take your usual medications as directed by your physician.  Increase your fluid intake.  Caffeinated beverages may be helpful.  Lie flat in bed until your headache resolves.  Restlessness or inability to sleep- you may have trouble sleeping for the next few days.  Ask your referring physician if you need any medication for sleep.  Facial flushing or redness- should subside within a few days.  Increased pain- a temporary increase in pain a day or two following your procedure is not unusual.  Take your pain medication as prescribed by your referring physician.  Leg cramps  Please contact our office at [phone] for the following symptoms: Fever greater than 100 degrees. Headaches unresolved with medication after 2-3 days. Increased swelling, pain, or redness at injection site.   Thank you for visiting [facility] today.   Sacroplasty Post Procedure Discharge Instructions  May resume a regular diet and any medications that you routinely take (including pain medications). However, if you are taking Aspirin or an anticoagulant/blood thinner you will be told when you can resume taking these by the healthcare provider. No driving day of procedure. The day of your procedure take it easy. You may use an ice pack as needed to injection sites on  back.  Ice to back 30 minutes on and 30 minutes off, as needed. May remove bandaids tomorrow after taking a shower. Replace daily with a clean bandaid until healed.  Do not lift anything heavier than a milk jug for 1-2 weeks or determined by your physician.  Follow up with your physician in 2 weeks.    Please contact our office at [phone] for the following symptoms or if you have any questions:  Fever greater than 100 degrees Increased swelling, pain, or redness at injection site. Increased back and/or leg pain New numbness or change in symptoms from before the procedure.    Thank you for visiting [facility].    MAY RESUME PLAVIX POST PROCEDURE!

## 2022-01-08 NOTE — Progress Notes (Signed)
Pt back in nursing recovery area. Pt still drowsy from procedure but will wake up when spoken to. Pt follows commands, talks in complete sentences and has no complaints at this time. Pt will remain in nursing station until discharge.  ?

## 2022-01-14 ENCOUNTER — Other Ambulatory Visit: Payer: Self-pay | Admitting: Interventional Radiology

## 2022-01-14 DIAGNOSIS — Z712 Person consulting for explanation of examination or test findings: Secondary | ICD-10-CM

## 2022-01-17 ENCOUNTER — Ambulatory Visit
Admission: RE | Admit: 2022-01-17 | Discharge: 2022-01-17 | Disposition: A | Discharge status: Home / Self Care | Payer: Medicare Other | Source: Ambulatory Visit | Attending: Interventional Radiology | Admitting: Interventional Radiology

## 2022-01-17 ENCOUNTER — Encounter: Payer: Self-pay | Admitting: Internal Medicine

## 2022-01-17 DIAGNOSIS — Z712 Person consulting for explanation of examination or test findings: Secondary | ICD-10-CM

## 2022-01-17 DIAGNOSIS — M4727 Other spondylosis with radiculopathy, lumbosacral region: Secondary | ICD-10-CM | POA: Diagnosis not present

## 2022-01-17 HISTORY — PX: IR RADIOLOGIST EVAL & MGMT: IMG5224

## 2022-01-17 NOTE — Progress Notes (Signed)
Chief Complaint: Patient was seen in consultation today for low back and right leg pain at the request of Omir Cooprider K  Referring Physician(s): Watt Climes  History of Present Illness: Robin Logan is a 86 y.o. female who was initially seen on 11/13/2021 with approximately 3 months of low back pain and right lower extremity radiculopathy.  Her symptoms began when she started to experience weakness, numbness and tingling in the extremities.  She underwent 3 emergency room visits over a short course of time and was diagnosed with transient ischemic attacks.  The day after being discharged from the emergency room on her third visit she woke up spontaneously in the middle of the night with severe low back pain.  Subsequent work-up including an MRI of the thoracic and lumbar spine dated 08/15/2021 revealed an S2 sacral fracture with extension into the bilateral sacral ala.  Additionally, she has advanced degenerative spondylosis including progressive mild to moderate right neuroforaminal stenosis at L4-L5 and moderate to severe foraminal stenosis on the right at L5-S1.   She described her symptoms as centered in her lower back from the waist down. Additionally, she has a throbbing pain along the anterior aspect of her right shin along the distribution of the L5 nerve dermatome. She is currently taking hydrocodone which provides some control of her symptoms. She takes the hydrocodone at least twice daily and is unable to go without it. She rates her pain a 7 out of 10 without hydrocodone and a 4 out of 10 with the hydrocodone. Her symptoms are also debilitating. She scored a 16 out of 24 on the [employer] disability questionnaire.    At the time of our initial consultation, her cardiologist recommended that she not go off of her dual antiplatelet therapy with aspirin and Plavix.  She subsequently obtain clearance and underwent sacroplasty and concomitant right L5-S1 epidural steroid  injection on 01/08/2022.  She presents today for follow-up evaluation.  Her husband is with her.  She reports that in the first few days following her procedure she had no pain radiating from the knee to the foot.  She was quite pleased with this, however after a few days the pain returned and is now back.  Unfortunately, she has had only mild relief of her chronic low back pain.  She does feel that while it is some better she is continuing to require hydrocodone daily.  Past Medical History:  Diagnosis Date   Arthritis    Complication of anesthesia    Depression    Hyperlipidemia    Osteopenia    Osteopenia    Sleep disorder    Thyroid disease     Past Surgical History:  Procedure Laterality Date   ABDOMINAL HYSTERECTOMY     APPENDECTOMY     CATARACT EXTRACTION W/PHACO Right 03/03/2019   Procedure: CATARACT EXTRACTION PHACO AND INTRAOCULAR LENS PLACEMENT (IOC) RIGHT  00:44.0  16.1%  7.12;  Surgeon: Leandrew Koyanagi, MD;  Location: [place];  Service: Ophthalmology;  Laterality: Right;   CATARACT EXTRACTION W/PHACO Left 03/24/2019   Procedure: CATARACT EXTRACTION PHACO AND INTRAOCULAR LENS PLACEMENT (IOC) LEFT  01:09.4  12.5%  8.73;  Surgeon: Leandrew Koyanagi, MD;  Location: [place];  Service: Ophthalmology;  Laterality: Left;   IR RADIOLOGIST EVAL & MGMT  12/24/2021   IR RADIOLOGIST EVAL & MGMT  01/17/2022   TONSILLECTOMY AND ADENOIDECTOMY  1946    Allergies: Patient has no known allergies.  Medications: Prior to Admission medications  Medication Sig Start Date End Date Taking? Authorizing Provider  Ascorbic Acid (VITAMIN C) 1000 MG tablet Take 1,000 mg by mouth daily.    [provider]  atorvastatin (LIPITOR) 80 MG tablet TAKE 1 TABLET DAILY 12/03/21   Viviana Simpler I, MD  cholecalciferol (VITAMIN D) 1000 UNITS tablet Take 1,000 Units by mouth daily.    [provider]  clopidogrel (PLAVIX) 75 MG tablet Take 1 tablet (75 mg total)  by mouth daily. 12/06/21   Venia Carbon, MD  HYDROcodone-acetaminophen (NORCO/VICODIN) 5-325 MG tablet Take 1 tablet by mouth every 6 (six) hours as needed for severe pain. 11/13/21   Venia Carbon, MD  levothyroxine (SYNTHROID) 88 MCG tablet TAKE 1 TABLET DAILY 03/26/21   Viviana Simpler I, MD  metoprolol succinate (TOPROL-XL) 25 MG 24 hr tablet Take 1 tablet (25 mg total) by mouth daily. Take with or immediately following a meal. 12/20/21 06/13/23  Kate Sable, MD  mirabegron ER (MYRBETRIQ) 50 MG TB24 tablet Take 1 tablet (50 mg total) by mouth daily. 05/29/21   Venia Carbon, MD  mirtazapine (REMERON) 45 MG tablet Take 1 tablet (45 mg total) by mouth at bedtime. 02/08/21   Venia Carbon, MD  Multiple Vitamins-Minerals (ICAPS AREDS 2 PO) Take by mouth.    [provider]  Polyethylene Glycol 3350 (MIRALAX PO) Take by mouth.    [provider]  risedronate (ACTONEL) 150 MG tablet Take 1 tablet (150 mg total) by mouth every 30 (thirty) days. with water on empty stomach, nothing by mouth or lie down for next 30 minutes. 10/23/21   Venia Carbon, MD     Family History  Problem Relation Age of Onset   Hypertension Father    Stroke Brother    Diabetes Brother    Cancer Neg Hx        no breast or colon    Social History   Socioeconomic History   Marital status: Married    Spouse name: Not on file   Number of children: Not on file   Years of education: Not on file   Highest education level: Not on file  Occupational History   Occupation: retired- Production manager of [employer]  Tobacco Use   Smoking status: Former    Types: Cigarettes    Quit date: 06/03/1978    Years since quitting: 43.6    Passive exposure: Past   Smokeless tobacco: Never  Vaping Use   Vaping Use: Never used  Substance and Sexual Activity   Alcohol use: Yes    Alcohol/week: 0.0 standard drinks of alcohol    Comment: wine   Drug use: No   Sexual activity: Not on file   Other Topics Concern   Not on file  Social History Narrative   Requests DNR --order done 12/24/10. Currently keeping it in draw, would accept resuscitation for now   Has living will   Husband is health care POA---alternate would be nieces (both sides)   No tube feedings if cognitively unaware   Social Determinants of Health   Financial Resource Strain: Not on file  Food Insecurity: Not on file  Transportation Needs: Not on file  Physical Activity: Not on file  Stress: Not on file  Social Connections: Not on file   Review of Systems: A 12 point ROS discussed and pertinent positives are indicated in the HPI above.  All other systems are negative.  Review of Systems  Vital Signs: There were no  vitals taken for this visit.   Physical Exam Constitutional:      Appearance: Normal appearance.  HENT:     Head: Normocephalic and atraumatic.  Eyes:     General: No scleral icterus. Cardiovascular:     Rate and Rhythm: Normal rate.  Pulmonary:     Effort: Pulmonary effort is normal.  Skin:    General: Skin is warm and dry.  Neurological:     Mental Status: She is alert and oriented to person, place, and time.  Psychiatric:        Mood and Affect: Mood normal.        Behavior: Behavior normal.      Imaging: IR Radiologist Eval & Mgmt  Result Date: 01/17/2022 EXAM: NEW PATIENT OFFICE VISIT CHIEF COMPLAINT: SEE EPIC NOTE HISTORY OF PRESENT ILLNESS: SEE EPIC NOTE REVIEW OF SYSTEMS: SEE EPIC NOTE PHYSICAL EXAMINATION: SEE EPIC NOTE ASSESSMENT AND PLAN: SEE EPIC NOTE Electronically Signed   By: Jacqulynn Cadet M.D.   On: 01/17/2022 16:01   DG INJECT DIAG/THERA/INC NEEDLE/CATH/PLC EPI/LUMB/SAC W/IMG  Result Date: 01/08/2022 CLINICAL DATA:  Lumbosacral spondylosis without myelopathy. She has significant low back pain with right lower extremity radiculopathy along the right L5/S1 nerve root distribution. She presents for epidural steroid injection. FLUOROSCOPY: Radiation Exposure  Index (as provided by the fluoroscopic device): 1.1 mGy Kerma PROCEDURE: The procedure, risks, benefits, and alternatives were explained to the patient. Questions regarding the procedure were encouraged and answered. The patient understands and consents to the procedure. LUMBAR EPIDURAL INJECTION: An interlaminar approach was performed on right at L5-S1. The overlying skin was cleansed and anesthetized. A 20 gauge epidural needle was advanced using loss-of-resistance technique. DIAGNOSTIC EPIDURAL INJECTION: Injection of Isovue-M 200 shows a good epidural pattern with spread above and below the level of needle placement, primarily on the right no vascular opacification is seen. THERAPEUTIC EPIDURAL INJECTION: 80 mg of Depo-Medrol mixed with 2 mL 1% lidocaine were instilled. The procedure was well-tolerated, and the patient was discharged thirty minutes following the injection in good condition. COMPLICATIONS: None. IMPRESSION: Technically successful epidural injection on the right L5-S1. Electronically Signed   By: Jacqulynn Cadet M.D.   On: 01/08/2022 11:55   DG Epidural Veno/Verte Bropl  Result Date: 01/08/2022 INDICATION: 87 year old female with osteoporotic sacral insufficiency fracture with delayed healing, subsequent encounter. She presents for percutaneous cement augmentation with bilateral sacroplasty. EXAM: Sacroplasty COMPARISON:  None Available. MEDICATIONS: As antibiotic prophylaxis, 2 g Ancef was ordered pre-procedure and administered intravenously within 1 hour of incision. All current medications are in the EMR and have been reviewed as part of this encounter. ANESTHESIA/SEDATION: Moderate (conscious) sedation was employed during this procedure. A total of Versed 2 mg and Fentanyl 75 mcg was administered intravenously by the radiology nurse. Total intra-service moderate Sedation Time: 28 minutes. The patient's level of consciousness and vital signs were monitored continuously by radiology nursing  throughout the procedure under my direct supervision. FLUOROSCOPY: Radiation Exposure Index (as provided by the fluoroscopic device): 90.2 mGy Kerma COMPLICATIONS: None immediate. TECHNIQUE: Informed written consent was obtained from the patient after a thorough discussion of the procedural risks, benefits and alternatives. All questions were addressed. Maximal Sterile Barrier Technique was utilized including caps, mask, sterile gowns, sterile gloves, sterile drape, hand hygiene and skin antiseptic. A timeout was performed prior to the initiation of the procedure. Local anesthesia was attained by infiltration with 1% lidocaine. Craniocaudal angulation was applied in till the L5-S1 disc space was visible. The right sacral ala  was approached first. A suitable entry site at the level of S3 was selected midway between the lateral margin of the sacral foramina and the sacroiliac joint. The Kyphon introducer needle was advanced through the cortex and directed under intermittent fluoroscopic guidance into the sacral ala at the level of mid S1. Using similar technique, the left sacral ala was approached. The Kyphon introducer needle was carefully advanced using intermittent fluoroscopic imaging from the inferior aspect of S2 to the mid aspect of S1 maintaining a trajectory position midway between the lateral margin of the sacral foramina and the sacroiliac joint. Once both introducer needles were well-positioned, methylmethacrylate was mixed according to protocol. The methylmethacrylate was then used to fill the cement delivery system and cement was delivered in aliquots into the bilateral sacral ala. Cement filling was checked throughout the course of the procedure in the AP and lateral projections. Once there was adequate filling of the S1 and S2 levels, the cement delivery system and introducer cannulas were removed. No evidence of cement contamination into the foramina, sacroiliac joints or perisacral space. FINDINGS:  Technically successful bilateral sacroplasty IMPRESSION: 1. Bilateral sacroplasty. If the patient has known osteoporosis, recommend treatment as clinically indicated. If the patient's bone density status is unknown, DEXA scan is recommended. Electronically Signed   By: Jacqulynn Cadet M.D.   On: 01/08/2022 11:53   IR Radiologist Eval & Mgmt  Result Date: 12/24/2021 EXAM: NEW PATIENT OFFICE VISIT CHIEF COMPLAINT: SEE EPIC NOTE HISTORY OF PRESENT ILLNESS: SEE EPIC NOTE REVIEW OF SYSTEMS: SEE EPIC NOTE PHYSICAL EXAMINATION: SEE EPIC NOTE ASSESSMENT AND PLAN: SEE EPIC NOTE Electronically Signed   By: Jacqulynn Cadet M.D.   On: 12/24/2021 14:57    Labs:  CBC: Recent Labs    07/27/21 1201 08/03/21 2145 08/15/21 0803 12/25/21 0000  WBC 9.3 9.0 13.7* 5.0  HGB 12.2 11.4* 12.7 11.9  HCT 37.4 34.3* 37.7 36.0  PLT 268 287 454* 218    COAGS: Recent Labs    07/27/21 1201 08/03/21 2145  INR 0.9 0.9  APTT 34 32    BMP: Recent Labs    07/28/21 0127 07/28/21 0447 08/03/21 2145 08/15/21 0803 12/25/21 0000  NA 128* 128* 128* 129* 139  K 4.3 4.4 4.6 4.3 4.6  CL 96* 94* 93* 96* 104  CO2 '22 24 23 23 22  '$ GLUCOSE 87 89 117* 113* 65  BUN '16 13 23 '$ 25* 29*  CALCIUM 8.8* 8.9 9.0 9.0 8.7  CREATININE 0.73 0.72 0.84 1.08* 0.94  GFRNONAA >60 >60 >60 51*  --     LIVER FUNCTION TESTS: Recent Labs    01/23/21 1130 07/27/21 1201 08/03/21 2145 08/15/21 0803 12/25/21 0000  BILITOT 0.5 0.6 0.3 0.8 0.4  AST 26 38 '31 23 31  '$ ALT '23 30 30 24 '$ 36*  ALKPHOS 74 75 74 119  --   PROT 6.9 7.1 6.9 6.9 6.6  ALBUMIN 4.3  4.3 4.1 3.8 3.5  --     TUMOR MARKERS: No results for input(s): "AFPTM", "CEA", "CA199", "CHROMGRNA" in the last 8760 hours.  Assessment and Plan:  Very 86 year old female with a history of pathologic insufficiency fracture of the sacrum with delayed healing as well as a right L5 radiculopathy.  She underwent L5-S1 epidural steroid injection and concurrent sacroplasty on  Tuesday, 01/08/2022.  Unfortunately, she continues to have some issues with low back pain and right L5 radiculopathy.  Her symptoms did improve in the initial days following the procedure but have since come back.  I suspect the majority of her pain is due to spinal stenosis and impingement of the right L5 nerve root.  I believe she would benefit from a repeat epidural steroid injection.  Additionally, she may benefit from returning to her yoga practice now that her sacral insufficiency fracture has been repaired.  1.)  Return to yoga practice.  Use modifications and begin slowly.  I emphasized to Mrs. Winstanley that she will not be at the same level she was prior to her injury and she must progress slowly and cautiously and listen to her body. 2.)  Return to clinic visit with planned repeat right L5-S1 epidural steroid injection on 02/05/2022.     Electronically Signed: Criselda Peaches 01/17/2022, 4:40 PM   I spent a total of   25 Minutes in face to face in clinical consultation, greater than 50% of which was counseling/coordinating care for sacral fracture and spinal stenosis with right L5 radiculopathy

## 2022-01-18 MED ORDER — HYDROCODONE-ACETAMINOPHEN 5-325 MG PO TABS: 1.0000 | ORAL_TABLET | Freq: Four times a day (QID) | ORAL | 0 refills | Status: DC | PRN

## 2022-01-18 NOTE — Telephone Encounter (Signed)
Last office visit 10/23/21 for discuss osteoporosis options.   Last refilled 10/09/2021 for #60 with no refills.   CPE scheduled 01/29/2022.

## 2022-01-29 ENCOUNTER — Encounter: Payer: Self-pay | Admitting: Internal Medicine

## 2022-01-29 ENCOUNTER — Ambulatory Visit (INDEPENDENT_AMBULATORY_CARE_PROVIDER_SITE_OTHER): Payer: Medicare Other | Admitting: Internal Medicine

## 2022-01-29 ENCOUNTER — Other Ambulatory Visit: Payer: Self-pay | Admitting: Physician Assistant

## 2022-01-29 VITALS — BP 122/70 | HR 77 | Temp 97.8°F | Ht 62.0 in | Wt 132.0 lb

## 2022-01-29 DIAGNOSIS — I700 Atherosclerosis of aorta: Secondary | ICD-10-CM | POA: Diagnosis not present

## 2022-01-29 DIAGNOSIS — M48061 Spinal stenosis, lumbar region without neurogenic claudication: Secondary | ICD-10-CM | POA: Diagnosis not present

## 2022-01-29 DIAGNOSIS — M5416 Radiculopathy, lumbar region: Secondary | ICD-10-CM

## 2022-01-29 DIAGNOSIS — I471 Supraventricular tachycardia: Secondary | ICD-10-CM

## 2022-01-29 DIAGNOSIS — Z0000 Encounter for general adult medical examination without abnormal findings: Secondary | ICD-10-CM

## 2022-01-29 DIAGNOSIS — N1831 Chronic kidney disease, stage 3a: Secondary | ICD-10-CM

## 2022-01-29 DIAGNOSIS — I639 Cerebral infarction, unspecified: Secondary | ICD-10-CM | POA: Diagnosis not present

## 2022-01-29 MED ORDER — HYDROCODONE-ACETAMINOPHEN 5-325 MG PO TABS
1.0000 | ORAL_TABLET | Freq: Four times a day (QID) | ORAL | 0 refills | Status: DC | PRN
Start: 2022-01-29 — End: 2022-07-15

## 2022-01-29 NOTE — Progress Notes (Signed)
Subjective:    Patient ID: Robin Logan, female    DOB: 01/28/1936, 86 y.o.   MRN: [id number]  HPI Here for Medicare wellness visit and follow up of chronic health conditions Reviewed form and advanced directives Reviewed other doctors Occasional alcohol No tobacco Vision is fine Hearing is not great---but hearing aides help No falls Having some mood issues  Has housekeeper every 2 weeks for heavy work--she does the rest No sig memory issues  Has been very stressed Ongoing pain --initially the sacral fracture---but now due to spinal stenosis Tylenol not much help---has used the hydrocodone at times Getting back to yoga again  Gets tearful over the pain issues Sleeping okay with mirtazapine Worries about her husband also  Despite the myrbetriq---still has to get up at night several times Does seem to help during the day  No heart racing No chest pain or SOB No dizziness or syncope No edema  Recent GFR stable at 51  Current Outpatient Medications on File Prior to Visit  Medication Sig Dispense Refill   Ascorbic Acid (VITAMIN C) 1000 MG tablet Take 1,000 mg by mouth daily.     atorvastatin (LIPITOR) 80 MG tablet TAKE 1 TABLET DAILY 90 tablet 3   cholecalciferol (VITAMIN D) 1000 UNITS tablet Take 1,000 Units by mouth daily.     clopidogrel (PLAVIX) 75 MG tablet Take 1 tablet (75 mg total) by mouth daily. 90 tablet 3   HYDROcodone-acetaminophen (NORCO/VICODIN) 5-325 MG tablet Take 1 tablet by mouth every 6 (six) hours as needed for severe pain. 10 tablet 0   levothyroxine (SYNTHROID) 88 MCG tablet TAKE 1 TABLET DAILY 90 tablet 3   metoprolol succinate (TOPROL-XL) 25 MG 24 hr tablet Take 1 tablet (25 mg total) by mouth daily. Take with or immediately following a meal. 90 tablet 2   mirabegron ER (MYRBETRIQ) 50 MG TB24 tablet Take 1 tablet (50 mg total) by mouth daily. 90 tablet 3   mirtazapine (REMERON) 45 MG tablet Take 1 tablet (45 mg total) by mouth at bedtime. 90  tablet 3   Multiple Vitamins-Minerals (ICAPS AREDS 2 PO) Take by mouth.     Polyethylene Glycol 3350 (MIRALAX PO) Take by mouth.     risedronate (ACTONEL) 150 MG tablet Take 1 tablet (150 mg total) by mouth every 30 (thirty) days. with water on empty stomach, nothing by mouth or lie down for next 30 minutes. 3 tablet 3   No current facility-administered medications on file prior to visit.    No Known Allergies  Past Medical History:  Diagnosis Date   Arthritis    Complication of anesthesia    Depression    Hyperlipidemia    Osteopenia    Osteopenia    Sleep disorder    Thyroid disease     Past Surgical History:  Procedure Laterality Date   ABDOMINAL HYSTERECTOMY     APPENDECTOMY     CATARACT EXTRACTION W/PHACO Right 03/03/2019   Procedure: CATARACT EXTRACTION PHACO AND INTRAOCULAR LENS PLACEMENT (IOC) RIGHT  00:44.0  16.1%  7.12;  Surgeon: Leandrew Koyanagi, MD;  Location: [place];  Service: Ophthalmology;  Laterality: Right;   CATARACT EXTRACTION W/PHACO Left 03/24/2019   Procedure: CATARACT EXTRACTION PHACO AND INTRAOCULAR LENS PLACEMENT (IOC) LEFT  01:09.4  12.5%  8.73;  Surgeon: Leandrew Koyanagi, MD;  Location: [place];  Service: Ophthalmology;  Laterality: Left;   IR RADIOLOGIST EVAL & MGMT  12/24/2021   IR RADIOLOGIST EVAL & MGMT  01/17/2022  TONSILLECTOMY AND ADENOIDECTOMY  1946    Family History  Problem Relation Age of Onset   Hypertension Father    Stroke Brother    Diabetes Brother    Cancer Neg Hx        no breast or colon    Social History   Socioeconomic History   Marital status: Married    Spouse name: Not on file   Number of children: Not on file   Years of education: Not on file   Highest education level: Not on file  Occupational History   Occupation: retired- Production manager of [employer]  Tobacco Use   Smoking status: Former    Types: Cigarettes    Quit date: 06/03/1978    Years since quitting: 43.6     Passive exposure: Past   Smokeless tobacco: Never  Vaping Use   Vaping Use: Never used  Substance and Sexual Activity   Alcohol use: Yes    Alcohol/week: 0.0 standard drinks of alcohol    Comment: wine   Drug use: No   Sexual activity: Not on file  Other Topics Concern   Not on file  Social History Narrative   Requests DNR --order done 12/24/10 and now on display   Has living will   Husband is health care POA---alternate would be nieces (both sides)   No tube feedings if cognitively unaware   Social Determinants of Health   Financial Resource Strain: Not on file  Food Insecurity: Not on file  Transportation Needs: Not on file  Physical Activity: Not on file  Stress: Not on file  Social Connections: Not on file  Intimate Partner Violence: Not on file   Review of Systems Eats okay--some stress eating Weight is fairly stable Wears seat belt Teeth okay---keeps up with dentist Some easy bruising. No suspicious lesions No heartburn or dysphagia Bowels move fine--no blood    Objective:   Physical Exam Constitutional:      Appearance: Normal appearance.  HENT:     Mouth/Throat:     Comments: No lesions Eyes:     Conjunctiva/sclera: Conjunctivae normal.     Pupils: Pupils are equal, round, and reactive to light.  Cardiovascular:     Rate and Rhythm: Normal rate and regular rhythm.     Pulses: Normal pulses.     Heart sounds: No murmur heard.    No gallop.  Abdominal:     Palpations: Abdomen is soft.     Tenderness: There is no abdominal tenderness.  Musculoskeletal:     Cervical back: Neck supple.     Right lower leg: No edema.     Left lower leg: No edema.  Lymphadenopathy:     Cervical: No cervical adenopathy.  Skin:    Findings: No lesion or rash.  Neurological:     General: No focal deficit present.     Mental Status: She is alert and oriented to person, place, and time.     Comments: Mini-cog normal  Psychiatric:        Behavior: Behavior normal.      Comments: Clearly upset talking about the pain--etc            Assessment & Plan:

## 2022-01-29 NOTE — Assessment & Plan Note (Signed)
On imaging Now on the plavix and statin

## 2022-01-29 NOTE — Assessment & Plan Note (Signed)
Stable GFR No action

## 2022-01-29 NOTE — Progress Notes (Signed)
Hearing Screening - Comments:: Has hearing aids. Not wearing them today Vision Screening - Comments:: September 2022

## 2022-01-29 NOTE — Assessment & Plan Note (Signed)
None since plavix and atorvastatin 80

## 2022-01-29 NOTE — Assessment & Plan Note (Signed)
No symptoms of recurrence on the metoprolol 25 mg daily

## 2022-01-29 NOTE — Assessment & Plan Note (Signed)
Will refill the hydrocodone for occasional use

## 2022-01-29 NOTE — Assessment & Plan Note (Signed)
I have personally reviewed the Medicare Annual Wellness questionnaire and have noted 1. The patient's medical and social history 2. Their use of alcohol, tobacco or illicit drugs 3. Their current medications and supplements 4. The patient's functional ability including ADL's, fall risks, home safety risks and hearing or visual             impairment. 5. Diet and physical activities 6. Evidence for depression or mood disorders  The patients weight, height, BMI and visual acuity have been recorded in the chart I have made referrals, counseling and provided education to the patient based review of the above and I have provided the pt with a written personalized care plan for preventive services.  I have provided you with a copy of your personalized plan for preventive services. Please take the time to review along with your updated medication list.  No cancer screening due to age Getting back to exercise Td at pharmacy Had both shingrix vaccines Updated COVID and flu vaccines this fall

## 2022-02-05 ENCOUNTER — Ambulatory Visit
Admission: RE | Admit: 2022-02-05 | Discharge: 2022-02-05 | Disposition: A | Discharge status: Home / Self Care | Payer: Medicare Other | Source: Ambulatory Visit | Attending: Physician Assistant | Admitting: Physician Assistant

## 2022-02-05 DIAGNOSIS — M5416 Radiculopathy, lumbar region: Secondary | ICD-10-CM

## 2022-02-05 DIAGNOSIS — M4727 Other spondylosis with radiculopathy, lumbosacral region: Secondary | ICD-10-CM | POA: Diagnosis not present

## 2022-02-05 MED ORDER — IOPAMIDOL (ISOVUE-M 200) INJECTION 41%
1.0000 mL | Freq: Once | INTRAMUSCULAR | Status: AC
  Administered 2022-02-05: 1 mL via EPIDURAL

## 2022-02-05 MED ORDER — METHYLPREDNISOLONE ACETATE 40 MG/ML INJ SUSP (RADIOLOG
80.0000 mg | Freq: Once | INTRAMUSCULAR | Status: AC
  Administered 2022-02-05: 80 mg via EPIDURAL

## 2022-02-05 NOTE — Discharge Instructions (Signed)
Post Procedure Spinal Discharge Instruction Sheet  You may resume a regular diet and any medications that you routinely take (including pain medications) unless otherwise noted by MD.  No driving day of procedure.  Light activity throughout the rest of the day.  Do not do any strenuous work, exercise, bending or lifting.  The day following the procedure, you can resume normal physical activity but you should refrain from exercising or physical therapy for at least three days thereafter.  You may apply ice to the injection site, 20 minutes on, 20 minutes off, as needed. Do not apply ice directly to skin.    Common Side Effects:  Headaches- take your usual medications as directed by your physician.  Increase your fluid intake.  Caffeinated beverages may be helpful.  Lie flat in bed until your headache resolves.  Restlessness or inability to sleep- you may have trouble sleeping for the next few days.  Ask your referring physician if you need any medication for sleep.  Facial flushing or redness- should subside within a few days.  Increased pain- a temporary increase in pain a day or two following your procedure is not unusual.  Take your pain medication as prescribed by your referring physician.  Leg cramps  Please contact our office at [phone] for the following symptoms: Fever greater than 100 degrees. Headaches unresolved with medication after 2-3 days. Increased swelling, pain, or redness at injection site.   Thank you for visiting [facility] today.    May resume Plavix immediately after procedure!

## 2022-03-11 ENCOUNTER — Other Ambulatory Visit: Payer: Self-pay | Admitting: Internal Medicine

## 2022-03-14 ENCOUNTER — Encounter: Payer: Self-pay | Admitting: Internal Medicine

## 2022-03-14 DIAGNOSIS — Z8673 Personal history of transient ischemic attack (TIA), and cerebral infarction without residual deficits: Secondary | ICD-10-CM | POA: Diagnosis not present

## 2022-03-14 DIAGNOSIS — E854 Organ-limited amyloidosis: Secondary | ICD-10-CM | POA: Diagnosis not present

## 2022-03-14 DIAGNOSIS — I680 Cerebral amyloid angiopathy: Secondary | ICD-10-CM | POA: Diagnosis not present

## 2022-03-14 DIAGNOSIS — G629 Polyneuropathy, unspecified: Secondary | ICD-10-CM | POA: Diagnosis not present

## 2022-03-14 DIAGNOSIS — M5417 Radiculopathy, lumbosacral region: Secondary | ICD-10-CM | POA: Diagnosis not present

## 2022-03-14 DIAGNOSIS — I618 Other nontraumatic intracerebral hemorrhage: Secondary | ICD-10-CM | POA: Diagnosis not present

## 2022-03-14 DIAGNOSIS — M48061 Spinal stenosis, lumbar region without neurogenic claudication: Secondary | ICD-10-CM | POA: Diagnosis not present

## 2022-03-15 MED ORDER — MIRABEGRON ER 50 MG PO TB24: 50.0000 mg | ORAL_TABLET | Freq: Every day | ORAL | 3 refills | Status: DC

## 2022-03-19 DIAGNOSIS — Z23 Encounter for immunization: Secondary | ICD-10-CM | POA: Diagnosis not present

## 2022-03-21 ENCOUNTER — Encounter: Payer: Self-pay | Admitting: Student

## 2022-03-22 ENCOUNTER — Encounter: Payer: Self-pay | Admitting: Student

## 2022-03-22 ENCOUNTER — Ambulatory Visit: Payer: Medicare Other | Admitting: Student

## 2022-03-22 VITALS — BP 120/74 | HR 73 | Temp 97.5°F | Ht 62.0 in | Wt 132.0 lb

## 2022-03-22 DIAGNOSIS — I639 Cerebral infarction, unspecified: Secondary | ICD-10-CM

## 2022-03-22 DIAGNOSIS — I700 Atherosclerosis of aorta: Secondary | ICD-10-CM

## 2022-03-22 DIAGNOSIS — G3184 Mild cognitive impairment, so stated: Secondary | ICD-10-CM | POA: Insufficient documentation

## 2022-03-22 DIAGNOSIS — Z66 Do not resuscitate: Secondary | ICD-10-CM | POA: Diagnosis not present

## 2022-03-22 DIAGNOSIS — G459 Transient cerebral ischemic attack, unspecified: Secondary | ICD-10-CM

## 2022-03-22 DIAGNOSIS — G479 Sleep disorder, unspecified: Secondary | ICD-10-CM

## 2022-03-22 DIAGNOSIS — N3281 Overactive bladder: Secondary | ICD-10-CM

## 2022-03-22 DIAGNOSIS — F39 Unspecified mood [affective] disorder: Secondary | ICD-10-CM | POA: Diagnosis not present

## 2022-03-22 DIAGNOSIS — I471 Supraventricular tachycardia, unspecified: Secondary | ICD-10-CM

## 2022-03-22 DIAGNOSIS — I6523 Occlusion and stenosis of bilateral carotid arteries: Secondary | ICD-10-CM | POA: Diagnosis not present

## 2022-03-22 DIAGNOSIS — E039 Hypothyroidism, unspecified: Secondary | ICD-10-CM | POA: Diagnosis not present

## 2022-03-22 DIAGNOSIS — M8000XA Age-related osteoporosis with current pathological fracture, unspecified site, initial encounter for fracture: Secondary | ICD-10-CM

## 2022-03-22 DIAGNOSIS — M48061 Spinal stenosis, lumbar region without neurogenic claudication: Secondary | ICD-10-CM | POA: Diagnosis not present

## 2022-03-22 NOTE — Patient Instructions (Addendum)
About Mild Cognitive Impairment Mild cognitive impairment causes cognitive changes that are serious enough to be noticed by the  individuals experiencing them or to other people, but the changes are not severe enough to interfere  with daily life or independent function.  Because the changes caused by MCI are not severe enough to affect daily life, a person with MCI does  not meet diagnostic guidelines for dementia. However, those with MCI have an increased risk of  eventually developing Alzheimer's r another type of dementia. However, not all people with MCI get  worse and some eventually get better.  Continue your healthy diet and exercise. We can continue to monitor your   Please get your Tetanus shot at [facility] or [facility].

## 2022-03-22 NOTE — Progress Notes (Signed)
Location:      Place of Service:     Provider:   Code Status: DNR Goals of Care:     08/15/2021    8:00 AM  Advanced Directives  Does Patient Have a Medical Advance Directive? Yes  Type of Paramedic of [place];Living will     Chief Complaint  Patient presents with   Establish Care    New patient establish care. Pill bottles present/not present at the time of appointment.     HPI: Patient is a 86 y.o. female seen today for medical management of chronic diseases to establish care.  Starting in February she had a mild strok and TIA. She is wondering if she has started to have memory changes. Another rtime she was in the ED and had TIA and spinal stenosis. And another time she had spinal fracture and spinal stenosis. She has sacroplasty for her fracture. She continued to have back pain and pain going down her leg. She continues to have pain from spinal stenossi. She doesn't want surgery and she restarted yoga. She has been in yoga and is there now. They were back to back for 3 weeks.   Pain - she doesn't take the hydrocodone at all. She has some of them at the home. She gets a refill periodically if she needs it. She doesn't have pain in yoga. She does it MWF for an hour. She hasn't had one for almost 1 month.  Bleeding signs? None while plavix Appetite: good but she doesn't eat much.   Current Function Walks independently. They have house cleaning every 2 weeks. She cooks. She still drives. Groceries they order them online.  Hearing Aids? Working well.  Vision is Good. She has monovision. Previously had contacts that were near and distant. Had cataracts removed.  Driving  Husband present. Had a minor stroke last February and feels like she is fogetting things.   Myrbetriq was going to the RR 2-3x per night. She was started and it seems to have helped. It's expensive.  She has been on remeron for years and years. She takes it every night   Her husband  has noticed that at time she will wonder what the right word is. She will forget some words. The right word to use. She was previously more reliable that her husband.   Past Medical History:  Diagnosis Date   Arthritis    Complication of anesthesia    Depression    Hyperlipidemia    Osteopenia    Osteopenia    Sleep disorder    Thyroid disease     Past Surgical History:  Procedure Laterality Date   ABDOMINAL HYSTERECTOMY     APPENDECTOMY     CATARACT EXTRACTION W/PHACO Right 03/03/2019   Procedure: CATARACT EXTRACTION PHACO AND INTRAOCULAR LENS PLACEMENT (IOC) RIGHT  00:44.0  16.1%  7.12;  Surgeon: Leandrew Koyanagi, MD;  Location: [facility];  Service: Ophthalmology;  Laterality: Right;   CATARACT EXTRACTION W/PHACO Left 03/24/2019   Procedure: CATARACT EXTRACTION PHACO AND INTRAOCULAR LENS PLACEMENT (IOC) LEFT  01:09.4  12.5%  8.73;  Surgeon: Leandrew Koyanagi, MD;  Location: [place];  Service: Ophthalmology;  Laterality: Left;   IR RADIOLOGIST EVAL & MGMT  12/24/2021   IR RADIOLOGIST EVAL & MGMT  01/17/2022   TONSILLECTOMY AND ADENOIDECTOMY  1946    No Known Allergies  Outpatient Encounter Medications as of 03/22/2022  Medication Sig   Ascorbic Acid (VITAMIN C) 1000 MG tablet Take  1,000 mg by mouth daily.   atorvastatin (LIPITOR) 80 MG tablet TAKE 1 TABLET DAILY   cholecalciferol (VITAMIN D) 1000 UNITS tablet Take 1,000 Units by mouth daily.   clopidogrel (PLAVIX) 75 MG tablet Take 1 tablet (75 mg total) by mouth daily.   HYDROcodone-acetaminophen (NORCO/VICODIN) 5-325 MG tablet Take 1 tablet by mouth every 6 (six) hours as needed for severe pain.   levothyroxine (SYNTHROID) 88 MCG tablet TAKE 1 TABLET DAILY   metoprolol succinate (TOPROL-XL) 25 MG 24 hr tablet Take 1 tablet (25 mg total) by mouth daily. Take with or immediately following a meal.   mirabegron ER (MYRBETRIQ) 50 MG TB24 tablet Take 1 tablet (50 mg total) by mouth daily.   mirtazapine  (REMERON) 45 MG tablet TAKE 1 TABLET AT BEDTIME (CHANGING BACK TO HIGHER DOSE)   Multiple Vitamins-Minerals (ICAPS AREDS 2 PO) Take by mouth.   Polyethylene Glycol 3350 (MIRALAX PO) Take by mouth.   risedronate (ACTONEL) 150 MG tablet Take 1 tablet (150 mg total) by mouth every 30 (thirty) days. with water on empty stomach, nothing by mouth or lie down for next 30 minutes.   No facility-administered encounter medications on file as of 03/22/2022.    Review of Systems:  Review of Systems  All other systems reviewed and are negative.  Health Maintenance  Topic Date Due   Zoster Vaccines- Shingrix (2 of 2) 03/18/2019   MAMMOGRAM  03/31/2020   TETANUS/TDAP  05/01/2020   COVID-19 Vaccine (5 - Moderna series) 12/12/2020   INFLUENZA VACCINE  01/01/2022   Pneumonia Vaccine 69+ Years old  Completed   DEXA SCAN  Completed   HPV VACCINES  Aged Out    Physical Exam: There were no vitals filed for this visit. There is no height or weight on file to calculate BMI. Physical Exam Vitals reviewed.  Constitutional:      Appearance: Normal appearance.  Cardiovascular:     Rate and Rhythm: Normal rate and regular rhythm.     Pulses: Normal pulses.     Heart sounds: Normal heart sounds.  Pulmonary:     Effort: Pulmonary effort is normal.  Abdominal:     General: Abdomen is flat. Bowel sounds are normal.     Palpations: Abdomen is soft.  Musculoskeletal:        General: No swelling or tenderness.  Skin:    General: Skin is warm and dry.     Capillary Refill: Capillary refill takes 2 to 3 seconds.  Neurological:     Mental Status: She is alert and oriented to person, place, and time.     Gait: Gait normal.  Psychiatric:        Mood and Affect: Mood normal.    Labs reviewed: Basic Metabolic Panel: Recent Labs    07/27/21 1414 07/27/21 2040 08/03/21 2145 08/15/21 0803 12/25/21 0000  NA  --    < > 128* 129* 139  K  --    < > 4.6 4.3 4.6  CL  --    < > 93* 96* 104  CO2  --    < >  '23 23 22  '$ GLUCOSE  --    < > 117* 113* 65  BUN  --    < > 23 25* 29*  CREATININE  --    < > 0.84 1.08* 0.94  CALCIUM  --    < > 9.0 9.0 8.7  TSH 6.347*  --   --   --   --    < > =  values in this interval not displayed.   Liver Function Tests: Recent Labs    07/27/21 1201 08/03/21 2145 08/15/21 0803 12/25/21 0000  AST 38 '31 23 31  '$ ALT '30 30 24 '$ 36*  ALKPHOS 75 74 119  --   BILITOT 0.6 0.3 0.8 0.4  PROT 7.1 6.9 6.9 6.6  ALBUMIN 4.1 3.8 3.5  --    No results for input(s): "LIPASE", "AMYLASE" in the last 8760 hours. No results for input(s): "AMMONIA" in the last 8760 hours. CBC: Recent Labs    07/27/21 1201 08/03/21 2145 08/15/21 0803 12/25/21 0000  WBC 9.3 9.0 13.7* 5.0  NEUTROABS 6.8 6.5  --   --   HGB 12.2 11.4* 12.7 11.9  HCT 37.4 34.3* 37.7 36.0  MCV 94.4 93.0 91.5 95.5  PLT 268 287 454* 218   Lipid Panel: Recent Labs    07/27/21 1414 07/28/21 0447  CHOL 188 173  HDL 87 71  LDLCALC 78 91  TRIG 113 56  CHOLHDL 2.2 2.4   Lab Results  Component Value Date   HGBA1C 5.9 (H) 07/27/2021    Procedures since last visit: No results found.  Assessment/Plan 1. Do not resuscitate Maintains this status  2. Mild cognitive impairment SLUMS score today 25/30-- points lost for animals listing, math problem. Patient is alert and oriented to self, place, situation. Thought to be due to CVA earlier this year. Discussed healthy diet and exercise. No medications for treatment. Continue to monitor for functional changes. Most recent B12 within normal range   3. Recurrent cerebrovascular accidents (CVAs) ([place]) Hx of stroke this year. Continue plavix 75 mg daily.   4. TIA (transient ischemic attack) Continue plavix. No signs or symptoms.   5. Hypothyroidism, unspecified type Most recent TSH within normal limits 3 months ago. Continue levothyroxine 88 mcg daily.  6. Age-related osteoporosis with current pathological fracture, initial encounter Patient with history of  sacral fracture. Continue Risendronate 20 mg 1x per month.   7. Mood disorder ([place]) Hx of sleeping issues and changes in mood. Previous trials of medication titration   8. Aortic atherosclerosis (H[facility])  Bilateral carotid artery stenosis Continue plavix 75 mg and atorvastatin 20 mg daily.   10. Sleep disorder Mirtazapine 45 mg nightly for many years. Failed titrations in the past. Continue   11. Spinal Stenosis Patient has had improvement of pain since restarting Yoga. NO hydrocodone in 3-4 weeks. Discussed importance of discarding if she stops taking completely. Discussed influence on her memory and overall function.   12. SVT Continue metoprolol 25 mg. Rate controlled on this medication.   13. Overactive Bladder Discussed current symptoms. Well-controlled on mirabegron 50 mg daily. Discussed anticholinergic effects. Will plan to continue for now. Continue to discuss dose reduction at follow up.    Labs/tests ordered:  * No order type specified * Next appt:  6 months  I spent 60 minutes in face to face time, chart review, and patient education.    Tomasa Rand, MD, [facility] [phone]

## 2022-03-27 ENCOUNTER — Other Ambulatory Visit: Payer: Self-pay | Admitting: Internal Medicine

## 2022-04-01 ENCOUNTER — Telehealth: Payer: Self-pay | Admitting: Cardiology

## 2022-04-01 ENCOUNTER — Other Ambulatory Visit: Payer: Self-pay | Admitting: Student

## 2022-04-01 DIAGNOSIS — E039 Hypothyroidism, unspecified: Secondary | ICD-10-CM

## 2022-04-01 MED ORDER — LEVOTHYROXINE SODIUM 88 MCG PO TABS
88.0000 ug | ORAL_TABLET | Freq: Every day | ORAL | 4 refills | Status: DC
Start: 2022-04-01 — End: 2022-10-30

## 2022-04-01 NOTE — Telephone Encounter (Signed)
*  STAT* If patient is at the pharmacy, call can be transferred to refill team.   1. Which medications need to be refilled? (please list name of each medication and dose if known) metoprolol succinate (TOPROL-XL) 25 MG 24 hr tablet  2. Which pharmacy/location (including street and city if local pharmacy) is medication to be sent to? [facility] [place], [place]  3. Do they need a 30 day or 90 day supply? 90 day

## 2022-04-01 NOTE — Telephone Encounter (Signed)
90 day supply with 2 refills sent to [facility]-Receipt confirmed by pharmacy (12/20/2021  1:14 PM EDT)

## 2022-04-01 NOTE — Addendum Note (Signed)
Addended by: Dewayne Shorter on: 04/01/2022 10:16 AM   Modules accepted: Orders

## 2022-04-01 NOTE — Progress Notes (Signed)
Rx request for levothyroxine 88 mcg refill.  Tomasa Rand, MD, [facility] [phone]

## 2022-04-12 DIAGNOSIS — Z23 Encounter for immunization: Secondary | ICD-10-CM | POA: Diagnosis not present

## 2022-04-24 DIAGNOSIS — D1801 Hemangioma of skin and subcutaneous tissue: Secondary | ICD-10-CM | POA: Diagnosis not present

## 2022-04-24 DIAGNOSIS — R233 Spontaneous ecchymoses: Secondary | ICD-10-CM | POA: Diagnosis not present

## 2022-04-24 DIAGNOSIS — L821 Other seborrheic keratosis: Secondary | ICD-10-CM | POA: Diagnosis not present

## 2022-04-24 DIAGNOSIS — L814 Other melanin hyperpigmentation: Secondary | ICD-10-CM | POA: Diagnosis not present

## 2022-05-28 ENCOUNTER — Other Ambulatory Visit: Payer: Self-pay | Admitting: Internal Medicine

## 2022-05-31 ENCOUNTER — Other Ambulatory Visit: Payer: Self-pay

## 2022-05-31 ENCOUNTER — Encounter: Payer: Self-pay | Admitting: Student

## 2022-05-31 MED ORDER — MIRABEGRON ER 50 MG PO TB24: 50.0000 mg | ORAL_TABLET | Freq: Every day | ORAL | 3 refills | Status: DC

## 2022-06-06 ENCOUNTER — Other Ambulatory Visit: Payer: Self-pay | Admitting: Student

## 2022-06-06 DIAGNOSIS — I6523 Occlusion and stenosis of bilateral carotid arteries: Secondary | ICD-10-CM

## 2022-06-06 MED ORDER — CLOPIDOGREL BISULFATE 75 MG PO TABS: 75.0000 mg | ORAL_TABLET | Freq: Every day | ORAL | 3 refills | Status: DC

## 2022-06-06 NOTE — Progress Notes (Signed)
[facility]ill for Plavix [facility].

## 2022-06-14 ENCOUNTER — Ambulatory Visit: Payer: Medicare Other | Admitting: Student

## 2022-06-14 ENCOUNTER — Encounter: Payer: Self-pay | Admitting: Student

## 2022-06-14 VITALS — BP 118/72 | HR 74 | Temp 98.0°F | Ht 62.0 in | Wt 137.0 lb

## 2022-06-14 DIAGNOSIS — M48061 Spinal stenosis, lumbar region without neurogenic claudication: Secondary | ICD-10-CM | POA: Diagnosis not present

## 2022-06-14 NOTE — Progress Notes (Signed)
Location:  [facility]   Place of Service:   [facility]  Provider: Unk Lightning  Code Status: DNR Goals of Care:     03/22/2022    2:17 PM  Advanced Directives  Does Patient Have a Medical Advance Directive? Yes  Type of Paramedic of [place];Living will;Out of facility DNR (pink MOST or yellow form)  Does patient want to make changes to medical advance directive? No - Patient declined  Copy of [place] in Chart? Yes - validated most recent copy scanned in chart (See row information)  Pre-existing out of facility DNR order (yellow form or pink MOST form) Yellow form placed in chart (order not valid for inpatient use)     Chief Complaint  Patient presents with   Acute Visit    Spinal Stenosis worsening. Wants to discuss taking Hydrocodone.     HPI: Patient is a 87 y.o. female seen today for an acute visit for spinal stenosis pain. She has pain in the right leg. She has had chronic lumbar spinal stenosis and has been taking norco for the pain.   She used to take one norco in the mornning. She hasn't taken it since our last appointment.   She tried tylenol. Aleve - she can't take. She hasn't tried voltaren gel or lidocaine patches. MWF she has Yoga class - helps a lot. Balance and strength. She had Gabapentin in the past and she didn't tolerate it well.   She hasn't had numbness or tingling in her feet or legs.  Past Medical History:  Diagnosis Date   Arthritis    Complication of anesthesia    Depression    Hyperlipidemia    Osteopenia    Osteopenia    Sleep disorder    Thyroid disease     Past Surgical History:  Procedure Laterality Date   ABDOMINAL HYSTERECTOMY     APPENDECTOMY     CATARACT EXTRACTION W/PHACO Right 03/03/2019   Procedure: CATARACT EXTRACTION PHACO AND INTRAOCULAR LENS PLACEMENT (IOC) RIGHT  00:44.0  16.1%  7.12;  Surgeon: Leandrew Koyanagi, MD;  Location: [facility];  Service: Ophthalmology;   Laterality: Right;   CATARACT EXTRACTION W/PHACO Left 03/24/2019   Procedure: CATARACT EXTRACTION PHACO AND INTRAOCULAR LENS PLACEMENT (IOC) LEFT  01:09.4  12.5%  8.73;  Surgeon: Leandrew Koyanagi, MD;  Location: [facility];  Service: Ophthalmology;  Laterality: Left;   IR RADIOLOGIST EVAL & MGMT  12/24/2021   IR RADIOLOGIST EVAL & MGMT  01/17/2022   TONSILLECTOMY AND ADENOIDECTOMY  1946    No Known Allergies  Outpatient Encounter Medications as of 06/14/2022  Medication Sig   Ascorbic Acid (VITAMIN C) 1000 MG tablet Take 1,000 mg by mouth daily.   atorvastatin (LIPITOR) 80 MG tablet TAKE 1 TABLET DAILY   cholecalciferol (VITAMIN D) 1000 UNITS tablet Take 1,000 Units by mouth daily.   clopidogrel (PLAVIX) 75 MG tablet Take 1 tablet (75 mg total) by mouth daily.   HYDROcodone-acetaminophen (NORCO/VICODIN) 5-325 MG tablet Take 1 tablet by mouth every 6 (six) hours as needed for severe pain.   levothyroxine (SYNTHROID) 88 MCG tablet Take 1 tablet (88 mcg total) by mouth daily.   metoprolol succinate (TOPROL-XL) 25 MG 24 hr tablet Take 1 tablet (25 mg total) by mouth daily. Take with or immediately following a meal.   mirabegron ER (MYRBETRIQ) 50 MG TB24 tablet Take 1 tablet (50 mg total) by mouth daily.   mirtazapine (REMERON) 45 MG tablet  TAKE 1 TABLET AT BEDTIME (CHANGING BACK TO HIGHER DOSE)   Multiple Vitamins-Minerals (ICAPS AREDS 2 PO) Take by mouth.   Polyethylene Glycol 3350 (MIRALAX PO) Take by mouth.   risedronate (ACTONEL) 150 MG tablet Take 1 tablet (150 mg total) by mouth every 30 (thirty) days. with water on empty stomach, nothing by mouth or lie down for next 30 minutes.   No facility-administered encounter medications on file as of 06/14/2022.    Review of Systems:  Review of Systems  Health Maintenance  Topic Date Due   Zoster Vaccines- Shingrix (2 of 2) 03/18/2019   MAMMOGRAM  03/31/2020   DTaP/Tdap/Td (2 - Tdap) 05/01/2020   COVID-19 Vaccine (10 - 2023-24  season) 02/01/2022   Medicare Annual Wellness (AWV)  01/30/2023   Pneumonia Vaccine 72+ Years old  Completed   INFLUENZA VACCINE  Completed   DEXA SCAN  Completed   HPV VACCINES  Aged Out    Physical Exam: Vitals:   06/14/22 1458  BP: 118/72  Pulse: 74  Temp: 98 F (36.7 C)  SpO2: 98%  Weight: 137 lb (62.1 kg)  Height: '5\' 2"'$  (1.575 m)   Body mass index is 25.06 kg/m. Physical Exam Constitutional:      Appearance: Normal appearance.  Musculoskeletal:     Comments: Right leg with tenderness to lateral calf. No erythema or swelling 2+ pulses . 5/5 strength plantar and flexor strength bilateral feet.   Neurological:     Mental Status: She is alert.     Labs reviewed: Basic Metabolic Panel: Recent Labs    07/27/21 1414 07/27/21 2040 08/03/21 2145 08/15/21 0803 12/25/21 0000  NA  --    < > 128* 129* 139  K  --    < > 4.6 4.3 4.6  CL  --    < > 93* 96* 104  CO2  --    < > '23 23 22  '$ GLUCOSE  --    < > 117* 113* 65  BUN  --    < > 23 25* 29*  CREATININE  --    < > 0.84 1.08* 0.94  CALCIUM  --    < > 9.0 9.0 8.7  TSH 6.347*  --   --   --   --    < > = values in this interval not displayed.   Liver Function Tests: Recent Labs    07/27/21 1201 08/03/21 2145 08/15/21 0803 12/25/21 0000  AST 38 '31 23 31  '$ ALT '30 30 24 '$ 36*  ALKPHOS 75 74 119  --   BILITOT 0.6 0.3 0.8 0.4  PROT 7.1 6.9 6.9 6.6  ALBUMIN 4.1 3.8 3.5  --    No results for input(s): "LIPASE", "AMYLASE" in the last 8760 hours. No results for input(s): "AMMONIA" in the last 8760 hours. CBC: Recent Labs    07/27/21 1201 08/03/21 2145 08/15/21 0803 12/25/21 0000  WBC 9.3 9.0 13.7* 5.0  NEUTROABS 6.8 6.5  --   --   HGB 12.2 11.4* 12.7 11.9  HCT 37.4 34.3* 37.7 36.0  MCV 94.4 93.0 91.5 95.5  PLT 268 287 454* 218   Lipid Panel: Recent Labs    07/27/21 1414 07/28/21 0447  CHOL 188 173  HDL 87 71  LDLCALC 78 91  TRIG 113 56  CHOLHDL 2.2 2.4   Lab Results  Component Value Date   HGBA1C  5.9 (H) 07/27/2021    Procedures since last visit: No results found.  Assessment/Plan 1. Spinal  stenosis of lumbar region, unspecified whether neurogenic claudication present Patient with significant spinal stenosis. Took medication today for the first time in 5 months. Discussed concern for addictive aspect of medication. Discussed concern that continuing this medication under my care is not advised given memory changes, history fall risk. Messaged orthopedic PA to request follow up appointment for evaluation and further pain management. She still has ~20 tablets for pain. Recommend cutting medication in half to see if it could address her pain. Will discuss further at follow up - prefer to defer further use of narcotics. Recommend multimodal pain control with patches and creams.    Labs/tests ordered:  * No order type specified * Next appt:  09/23/2022

## 2022-06-14 NOTE — Patient Instructions (Addendum)
Please call Vance Peper, PA to see if you can schedule an appointment to follow up back pain. I am hesitant to get you back on the hydrocodone for pain medication since you have gone 4 months without.   [place], [place] [phone] Office: [phone]    Please cut tablets in half to see if you can have sufficient pain control. Please let me know when you have 5 tablets left and we will discuss the next steps for your management.   Try Topical pain control such as a Lidocaine Patch (SalonPas) or Diclofenac gel (Voltaren gel) to see if there is improvement.

## 2022-06-26 DIAGNOSIS — M5136 Other intervertebral disc degeneration, lumbar region: Secondary | ICD-10-CM | POA: Diagnosis not present

## 2022-06-26 DIAGNOSIS — M48061 Spinal stenosis, lumbar region without neurogenic claudication: Secondary | ICD-10-CM | POA: Diagnosis not present

## 2022-06-26 DIAGNOSIS — M5416 Radiculopathy, lumbar region: Secondary | ICD-10-CM | POA: Diagnosis not present

## 2022-07-02 DIAGNOSIS — B351 Tinea unguium: Secondary | ICD-10-CM | POA: Diagnosis not present

## 2022-07-02 DIAGNOSIS — I7091 Generalized atherosclerosis: Secondary | ICD-10-CM | POA: Diagnosis not present

## 2022-07-08 ENCOUNTER — Encounter: Payer: Self-pay | Admitting: Student

## 2022-07-08 DIAGNOSIS — M5136 Other intervertebral disc degeneration, lumbar region: Secondary | ICD-10-CM | POA: Diagnosis not present

## 2022-07-08 DIAGNOSIS — M5416 Radiculopathy, lumbar region: Secondary | ICD-10-CM | POA: Diagnosis not present

## 2022-07-08 DIAGNOSIS — M48061 Spinal stenosis, lumbar region without neurogenic claudication: Secondary | ICD-10-CM | POA: Diagnosis not present

## 2022-07-15 ENCOUNTER — Ambulatory Visit: Payer: Medicare Other | Admitting: Student

## 2022-07-15 ENCOUNTER — Encounter: Payer: Self-pay | Admitting: Student

## 2022-07-15 VITALS — BP 110/80 | Temp 97.7°F | Ht 62.0 in | Wt 138.0 lb

## 2022-07-15 DIAGNOSIS — M48061 Spinal stenosis, lumbar region without neurogenic claudication: Secondary | ICD-10-CM

## 2022-07-15 NOTE — Progress Notes (Signed)
Location:  [facility] clinic  Provider: Elva Breaker  Code Status: DNR     03/22/2022    2:17 PM  Advanced Directives  Does Patient Have a Medical Advance Directive? Yes  Type of Paramedic of [place];Living will;Out of facility DNR (pink MOST or yellow form)  Does patient want to make changes to medical advance directive? No - Patient declined  Copy of [place] in Chart? Yes - validated most recent copy scanned in chart (See row information)  Pre-existing out of facility DNR order (yellow form or pink MOST form) Yellow form placed in chart (order not valid for inpatient use)     Chief Complaint  Patient presents with   Follow-up    1 month follow up. Patient following up on back pain. Dr. Rogers Blocker wanted her to see Dr.Chasnis. Patient states that she saw the PA,but she scheduled for some incorrect studies.     HPI: Patient is a 87 y.o. female seen today for medical management of chronic diseases.    Her back pain is much better than before. She said she needed a nerve conduction study. She is not going to have the nerve conduction study. She has an appointment with Dr. Biagio Borg to get  She is scheduled for an injection in her back coming up next Thursday. Per chart review, it's likely intrathecal.    Past Medical History:  Diagnosis Date   Arthritis    Complication of anesthesia    Depression    Hyperlipidemia    Osteopenia    Osteopenia    Sleep disorder    Thyroid disease     Past Surgical History:  Procedure Laterality Date   ABDOMINAL HYSTERECTOMY     APPENDECTOMY     CATARACT EXTRACTION W/PHACO Right 03/03/2019   Procedure: CATARACT EXTRACTION PHACO AND INTRAOCULAR LENS PLACEMENT (IOC) RIGHT  00:44.0  16.1%  7.12;  Surgeon: Leandrew Koyanagi, MD;  Location: [facility];  Service: Ophthalmology;  Laterality: Right;   CATARACT EXTRACTION W/PHACO Left 03/24/2019   Procedure: CATARACT EXTRACTION PHACO AND INTRAOCULAR LENS  PLACEMENT (IOC) LEFT  01:09.4  12.5%  8.73;  Surgeon: Leandrew Koyanagi, MD;  Location: [facility];  Service: Ophthalmology;  Laterality: Left;   IR RADIOLOGIST EVAL & MGMT  12/24/2021   IR RADIOLOGIST EVAL & MGMT  01/17/2022   TONSILLECTOMY AND ADENOIDECTOMY  1946    No Known Allergies  Outpatient Encounter Medications as of 07/15/2022  Medication Sig   Ascorbic Acid (VITAMIN C) 1000 MG tablet Take 1,000 mg by mouth daily.   atorvastatin (LIPITOR) 80 MG tablet TAKE 1 TABLET DAILY   cholecalciferol (VITAMIN D) 1000 UNITS tablet Take 1,000 Units by mouth daily.   clopidogrel (PLAVIX) 75 MG tablet Take 1 tablet (75 mg total) by mouth daily.   levothyroxine (SYNTHROID) 88 MCG tablet Take 1 tablet (88 mcg total) by mouth daily.   metoprolol succinate (TOPROL-XL) 25 MG 24 hr tablet Take 1 tablet (25 mg total) by mouth daily. Take with or immediately following a meal.   mirabegron ER (MYRBETRIQ) 50 MG TB24 tablet Take 1 tablet (50 mg total) by mouth daily.   mirtazapine (REMERON) 45 MG tablet TAKE 1 TABLET AT BEDTIME (CHANGING BACK TO HIGHER DOSE)   Multiple Vitamins-Minerals (ICAPS AREDS 2 PO) Take by mouth.   Polyethylene Glycol 3350 (MIRALAX PO) Take by mouth.   risedronate (ACTONEL) 150 MG tablet Take 1 tablet (150 mg total) by mouth every 30 (thirty) days. with water  on empty stomach, nothing by mouth or lie down for next 30 minutes.   [DISCONTINUED] HYDROcodone-acetaminophen (NORCO/VICODIN) 5-325 MG tablet Take 1 tablet by mouth every 6 (six) hours as needed for severe pain.   No facility-administered encounter medications on file as of 07/15/2022.    Review of Systems:  Review of Systems  Health Maintenance  Topic Date Due   Zoster Vaccines- Shingrix (2 of 2) 03/18/2019   MAMMOGRAM  03/31/2020   DTaP/Tdap/Td (2 - Tdap) 05/01/2020   COVID-19 Vaccine (10 - 2023-24 season) 02/01/2022   Medicare Annual Wellness (AWV)  01/30/2023   Pneumonia Vaccine 39+ Years old  Completed    INFLUENZA VACCINE  Completed   DEXA SCAN  Completed   HPV VACCINES  Aged Out    Physical Exam: Vitals:   07/15/22 1329  BP: 110/80  Temp: 97.7 F (36.5 C)  Weight: 138 lb (62.6 kg)  Height: 5' 2"$  (1.575 m)   Body mass index is 25.24 kg/m. Physical Exam Constitutional:      Appearance: Normal appearance.  Cardiovascular:     Rate and Rhythm: Normal rate.     Pulses: Normal pulses.  Pulmonary:     Effort: Pulmonary effort is normal.     Breath sounds: Normal breath sounds.  Neurological:     Mental Status: She is alert and oriented to person, place, and time.     Labs reviewed: Basic Metabolic Panel: Recent Labs    07/27/21 1414 07/27/21 2040 08/03/21 2145 08/15/21 0803 12/25/21 0000  NA  --    < > 128* 129* 139  K  --    < > 4.6 4.3 4.6  CL  --    < > 93* 96* 104  CO2  --    < > 23 23 22  $ GLUCOSE  --    < > 117* 113* 65  BUN  --    < > 23 25* 29*  CREATININE  --    < > 0.84 1.08* 0.94  CALCIUM  --    < > 9.0 9.0 8.7  TSH 6.347*  --   --   --   --    < > = values in this interval not displayed.   Liver Function Tests: Recent Labs    07/27/21 1201 08/03/21 2145 08/15/21 0803 12/25/21 0000  AST 38 31 23 31  $ ALT 30 30 24 $ 36*  ALKPHOS 75 74 119  --   BILITOT 0.6 0.3 0.8 0.4  PROT 7.1 6.9 6.9 6.6  ALBUMIN 4.1 3.8 3.5  --    No results for input(s): "LIPASE", "AMYLASE" in the last 8760 hours. No results for input(s): "AMMONIA" in the last 8760 hours. CBC: Recent Labs    07/27/21 1201 08/03/21 2145 08/15/21 0803 12/25/21 0000  WBC 9.3 9.0 13.7* 5.0  NEUTROABS 6.8 6.5  --   --   HGB 12.2 11.4* 12.7 11.9  HCT 37.4 34.3* 37.7 36.0  MCV 94.4 93.0 91.5 95.5  PLT 268 287 454* 218   Lipid Panel: Recent Labs    07/27/21 1414 07/28/21 0447  CHOL 188 173  HDL 87 71  LDLCALC 78 91  TRIG 113 56  CHOLHDL 2.2 2.4   Lab Results  Component Value Date   HGBA1C 5.9 (H) 07/27/2021    Procedures since last visit: No results  found.  Assessment/Plan Spinal stenosis of lumbar region, unspecified whether neurogenic claudication present Improved with tylenol. No longer taking Hydrocodone. Injection scheduled. Plans to hold Plavix  for 5d prior to the injection.    Labs/tests ordered:  * No order type specified * Next appt:  09/23/2022

## 2022-07-15 NOTE — Patient Instructions (Addendum)
Please do not take Plavix 2/17-2/22 and then take it again on 2/23.   I'm so glad the tylenol has been helping your pain!  Please come to the clinic on April 18 at 7:30AM to get your labs collected.

## 2022-07-24 DIAGNOSIS — M48062 Spinal stenosis, lumbar region with neurogenic claudication: Secondary | ICD-10-CM | POA: Diagnosis not present

## 2022-07-24 DIAGNOSIS — M5416 Radiculopathy, lumbar region: Secondary | ICD-10-CM | POA: Diagnosis not present

## 2022-08-06 ENCOUNTER — Ambulatory Visit: Payer: Medicare Other | Admitting: Internal Medicine

## 2022-08-12 ENCOUNTER — Ambulatory Visit: Payer: Medicare Other | Admitting: Internal Medicine

## 2022-08-13 DIAGNOSIS — M5416 Radiculopathy, lumbar region: Secondary | ICD-10-CM | POA: Diagnosis not present

## 2022-08-13 DIAGNOSIS — M48061 Spinal stenosis, lumbar region without neurogenic claudication: Secondary | ICD-10-CM | POA: Diagnosis not present

## 2022-08-13 DIAGNOSIS — M5136 Other intervertebral disc degeneration, lumbar region: Secondary | ICD-10-CM | POA: Diagnosis not present

## 2022-08-30 ENCOUNTER — Telehealth: Payer: Self-pay | Admitting: Cardiology

## 2022-08-30 MED ORDER — METOPROLOL SUCCINATE ER 25 MG PO TB24: 25.0000 mg | ORAL_TABLET | Freq: Every day | ORAL | 0 refills | Status: DC

## 2022-08-30 NOTE — Telephone Encounter (Signed)
*  STAT* If patient is at the pharmacy, call can be transferred to refill team.   1. Which medications need to be refilled? (please list name of each medication and dose if known) metoprolol succinate (TOPROL-XL) 25 MG 24 hr tablet  2. Which pharmacy/location (including street and city if local pharmacy) is medication to be sent to? [facility] - [place], [place] - [address]  3. Do they need a 30 day or 90 day supply? 90 day  

## 2022-08-30 NOTE — Telephone Encounter (Signed)
Pt's medication was sent to pt's pharmacy as requested. Confirmation received.  °

## 2022-09-10 DIAGNOSIS — Z23 Encounter for immunization: Secondary | ICD-10-CM | POA: Diagnosis not present

## 2022-09-12 DIAGNOSIS — I7091 Generalized atherosclerosis: Secondary | ICD-10-CM | POA: Diagnosis not present

## 2022-09-12 DIAGNOSIS — B351 Tinea unguium: Secondary | ICD-10-CM | POA: Diagnosis not present

## 2022-09-23 ENCOUNTER — Ambulatory Visit: Payer: Medicare Other | Admitting: Student

## 2022-09-23 ENCOUNTER — Encounter: Payer: Self-pay | Admitting: Student

## 2022-09-23 VITALS — BP 136/84 | HR 78 | Temp 97.4°F | Ht 62.0 in | Wt 140.0 lb

## 2022-09-23 DIAGNOSIS — R635 Abnormal weight gain: Secondary | ICD-10-CM

## 2022-09-23 DIAGNOSIS — E039 Hypothyroidism, unspecified: Secondary | ICD-10-CM

## 2022-09-23 DIAGNOSIS — M8000XA Age-related osteoporosis with current pathological fracture, unspecified site, initial encounter for fracture: Secondary | ICD-10-CM | POA: Diagnosis not present

## 2022-09-23 DIAGNOSIS — N1831 Chronic kidney disease, stage 3a: Secondary | ICD-10-CM

## 2022-09-23 NOTE — Progress Notes (Signed)
Location:  [facility].   Provider: Dr. Earnestine Mealing  Code Status: DNR Goals of Care:     09/23/2022    2:05 PM  Advanced Directives  Does Patient Have a Medical Advance Directive? Yes  Type of Estate agent of [place];Out of facility DNR (pink MOST or yellow form)  Does patient want to make changes to medical advance directive? No - Patient declined  Copy of Healthcare Power of Attorney in Chart? Yes - validated most recent copy scanned in chart (See row information)     Chief Complaint  Patient presents with   Medical Management of Chronic Issues    Medical Management of Chronic Issues. 6 Month follow up   Quality Metric Gaps    To discuss need for Tdap and Zoster Vaccine.     HPI: Patient is a 87 y.o. female seen today for medical management of chronic diseases.    She is stressed and now she is having to do a lot more. He has a good attitude and does everything he can.   She doesn't feel like things are too much. She feels like she is stress eating. She hasn't weight this much in a long time. She felt her best at 125.   She goes to Yoga. They go to the pub and she has some alcohol periodically.   She has lunch once a month with 3 other ladies once a month at the terrace. She stays with a couple of widows as well. They have people come over of [place] and coffee.   Her sleep has been fine.   She has had more desserts and coffee once a week.  Past Medical History:  Diagnosis Date   Arthritis    Complication of anesthesia    Depression    Hyperlipidemia    Osteopenia    Osteopenia    Sleep disorder    Thyroid disease     Past Surgical History:  Procedure Laterality Date   ABDOMINAL HYSTERECTOMY     APPENDECTOMY     CATARACT EXTRACTION W/PHACO Right 03/03/2019   Procedure: CATARACT EXTRACTION PHACO AND INTRAOCULAR LENS PLACEMENT (IOC) RIGHT  00:44.0  16.1%  7.12;  Surgeon: Lockie Mola, MD;  Location: [facility];  Service: Ophthalmology;  Laterality: Right;   CATARACT EXTRACTION W/PHACO Left 03/24/2019   Procedure: CATARACT EXTRACTION PHACO AND INTRAOCULAR LENS PLACEMENT (IOC) LEFT  01:09.4  12.5%  8.73;  Surgeon: Lockie Mola, MD;  Location: [facility];  Service: Ophthalmology;  Laterality: Left;   IR RADIOLOGIST EVAL & MGMT  12/24/2021   IR RADIOLOGIST EVAL & MGMT  01/17/2022   TONSILLECTOMY AND ADENOIDECTOMY  1946    No Known Allergies  Outpatient Encounter Medications as of 09/23/2022  Medication Sig   acetaminophen (TYLENOL) 500 MG tablet Take Two tablets in the morning and Take Two tablets at bedtime.   Ascorbic Acid (VITAMIN C) 1000 MG tablet Take 1,000 mg by mouth daily.   atorvastatin (LIPITOR) 80 MG tablet TAKE 1 TABLET DAILY   cholecalciferol (VITAMIN D) 1000 UNITS tablet Take 1,000 Units by mouth daily.   clopidogrel (PLAVIX) 75 MG tablet Take 1 tablet (75 mg total) by mouth daily.   levothyroxine (SYNTHROID) 88 MCG tablet Take 1 tablet (88 mcg total) by mouth daily.   metoprolol succinate (TOPROL-XL) 25 MG 24 hr tablet Take 1 tablet (25 mg total) by mouth daily. Take with or immediately following a meal.   mirabegron ER (MYRBETRIQ)  50 MG TB24 tablet Take 1 tablet (50 mg total) by mouth daily.   mirtazapine (REMERON) 45 MG tablet TAKE 1 TABLET AT BEDTIME (CHANGING BACK TO HIGHER DOSE)   Multiple Vitamins-Minerals (ICAPS AREDS 2 PO) Take by mouth.   Polyethylene Glycol 3350 (MIRALAX PO) Take by mouth.   risedronate (ACTONEL) 150 MG tablet Take 1 tablet (150 mg total) by mouth every 30 (thirty) days. with water on empty stomach, nothing by mouth or lie down for next 30 minutes.   No facility-administered encounter medications on file as of 09/23/2022.    Review of Systems:  Review of Systems  Health Maintenance  Topic Date Due   Zoster Vaccines- Shingrix (2 of 2) 03/18/2019   DTaP/Tdap/Td (2 - Tdap) 05/01/2020   MAMMOGRAM  09/30/2029 (Originally 03/31/2020)    INFLUENZA VACCINE  01/02/2023   Medicare Annual Wellness (AWV)  01/30/2023   Pneumonia Vaccine 65+ Years old  Completed   DEXA SCAN  Completed   COVID-19 Vaccine  Completed   HPV VACCINES  Aged Out    Physical Exam: Vitals:   09/23/22 1401  BP: 136/84  Pulse: 78  Temp: (!) 97.4 F (36.3 C)  SpO2: 99%  Weight: 140 lb (63.5 kg)  Height:  (1.575 m)   Body mass index is 25.61 kg/m. Physical Exam  Labs reviewed: Basic Metabolic Panel: Recent Labs    12/25/21 0000  NA 139  K 4.6  CL 104  CO2 22  GLUCOSE 65  BUN 29*  CREATININE 0.94  CALCIUM 8.7   Liver Function Tests: Recent Labs    12/25/21 0000  AST 31  ALT 36*  BILITOT 0.4  PROT 6.6   No results for input(s): "LIPASE", "AMYLASE" in the last 8760 hours. No results for input(s): "AMMONIA" in the last 8760 hours. CBC: Recent Labs    12/25/21 0000  WBC 5.0  HGB 11.9  HCT 36.0  MCV 95.5  PLT 218   Lipid Panel: No results for input(s): "CHOL", "HDL", "LDLCALC", "TRIG", "CHOLHDL", "LDLDIRECT" in the last 8760 hours. Lab Results  Component Value Date   HGBA1C 5.9 (H) 07/27/2021    Procedures since last visit: No results found.  Assessment/Plan Hypothyroidism, unspecified type - Plan: TSH, CANCELED: TSH  Stage 3a chronic kidney disease - Plan: Complete Metabolic Panel with eGFR, CBC With Differential/Platelet, CANCELED: CBC With Differential/Platelet, CANCELED: Complete Metabolic Panel with eGFR  Age-related osteoporosis with current pathological fracture, initial encounter - Plan: VITAMIN D 25 Hydroxy (Vit-D Deficiency, Fractures), CANCELED: VITAMIN D 25 Hydroxy (Vit-D Deficiency, Fractures)  Weight gain Discussed concern that patient could have weight gain from use of mirtazepine which she has been taking for sleep. Discussed dietary changes and exercise. Will collect labs as outlined above to see if there are any other causes to weight changes. F/u 1 mo. If no improvement, will change to  trazodone. Also discussed caregiver burden and importance of having support as her husband is more dependent than previously.    Labs/tests ordered:  * No order type specified * Next appt:  Visit date not found   I spent 30 minutes of face to face time with this patient, chart review, and clinical documentation for the care of this patient.

## 2022-09-26 ENCOUNTER — Other Ambulatory Visit: Payer: Self-pay | Admitting: Internal Medicine

## 2022-09-27 ENCOUNTER — Encounter: Payer: Self-pay | Admitting: Student

## 2022-09-27 MED ORDER — RISEDRONATE SODIUM 150 MG PO TABS: 150.0000 mg | ORAL_TABLET | ORAL | 3 refills | Status: DC

## 2022-10-21 DIAGNOSIS — E039 Hypothyroidism, unspecified: Secondary | ICD-10-CM | POA: Diagnosis not present

## 2022-10-21 DIAGNOSIS — M8000XA Age-related osteoporosis with current pathological fracture, unspecified site, initial encounter for fracture: Secondary | ICD-10-CM | POA: Diagnosis not present

## 2022-10-21 DIAGNOSIS — N1831 Chronic kidney disease, stage 3a: Secondary | ICD-10-CM | POA: Diagnosis not present

## 2022-10-21 LAB — COMPLETE METABOLIC PANEL WITH GFR
AG Ratio: 1.8 (calc) (ref 1.0–2.5)
ALT: 46 U/L — ABNORMAL HIGH (ref 6–29)
AST: 35 U/L (ref 10–35)
Albumin: 4.3 g/dL (ref 3.6–5.1)
Alkaline phosphatase (APISO): 70 U/L (ref 37–153)
BUN/Creatinine Ratio: 21 (calc) (ref 6–22)
BUN: 21 mg/dL (ref 7–25)
CO2: 26 mmol/L (ref 20–32)
Calcium: 8.8 mg/dL (ref 8.6–10.4)
Chloride: 103 mmol/L (ref 98–110)
Creat: 1 mg/dL — ABNORMAL HIGH (ref 0.60–0.95)
Globulin: 2.4 g/dL (calc) (ref 1.9–3.7)
Glucose, Bld: 86 mg/dL (ref 65–99)
Potassium: 4.4 mmol/L (ref 3.5–5.3)
Sodium: 138 mmol/L (ref 135–146)
Total Bilirubin: 0.4 mg/dL (ref 0.2–1.2)
Total Protein: 6.7 g/dL (ref 6.1–8.1)
eGFR: 56 mL/min/{1.73_m2} — ABNORMAL LOW (ref >=60)

## 2022-10-21 LAB — CBC WITH DIFFERENTIAL/PLATELET
Absolute Monocytes: 451 cells/uL (ref 200–950)
Basophils Absolute: 29 cells/uL (ref 0–200)
Basophils Relative: 0.6 %
Eosinophils Absolute: 260 cells/uL (ref 15–500)
Eosinophils Relative: 5.3 %
HCT: 36.6 % (ref 35.0–45.0)
Hemoglobin: 12 g/dL (ref 11.7–15.5)
Lymphs Abs: 1210 cells/uL (ref 850–3900)
MCH: 31.3 pg (ref 27.0–33.0)
MCHC: 32.8 g/dL (ref 32.0–36.0)
MCV: 95.3 fL (ref 80.0–100.0)
MPV: 10.2 fL (ref 7.5–12.5)
Monocytes Relative: 9.2 %
Neutro Abs: 2950 cells/uL (ref 1500–7800)
Neutrophils Relative %: 60.2 %
Platelets: 263 10*3/uL (ref 140–400)
RBC: 3.84 10*6/uL (ref 3.80–5.10)
RDW: 12.2 % (ref 11.0–15.0)
Total Lymphocyte: 24.7 %
WBC: 4.9 10*3/uL (ref 3.8–10.8)

## 2022-10-21 LAB — TSH: TSH: 11.05 mIU/L — ABNORMAL HIGH (ref 0.40–4.50)

## 2022-10-21 LAB — VITAMIN D 25 HYDROXY (VIT D DEFICIENCY, FRACTURES): Vit D, 25-Hydroxy: 31 ng/mL (ref 30–100)

## 2022-10-30 ENCOUNTER — Ambulatory Visit: Payer: Medicare Other | Admitting: Student

## 2022-10-30 ENCOUNTER — Encounter: Payer: Self-pay | Admitting: Student

## 2022-10-30 VITALS — BP 134/78 | HR 72 | Temp 96.9°F | Ht 62.0 in | Wt 139.0 lb

## 2022-10-30 DIAGNOSIS — M8000XA Age-related osteoporosis with current pathological fracture, unspecified site, initial encounter for fracture: Secondary | ICD-10-CM | POA: Diagnosis not present

## 2022-10-30 DIAGNOSIS — E039 Hypothyroidism, unspecified: Secondary | ICD-10-CM

## 2022-10-30 DIAGNOSIS — M21612 Bunion of left foot: Secondary | ICD-10-CM | POA: Diagnosis not present

## 2022-10-30 DIAGNOSIS — G3184 Mild cognitive impairment, so stated: Secondary | ICD-10-CM | POA: Diagnosis not present

## 2022-10-30 MED ORDER — LEVOTHYROXINE SODIUM 100 MCG PO TABS
100.0000 ug | ORAL_TABLET | Freq: Every day | ORAL | 3 refills | Status: DC
Start: 2022-10-30 — End: 2022-12-13

## 2022-10-30 NOTE — Patient Instructions (Addendum)
I sent a new prescription for your levothyroxine -- it is now 100 mcg daily. We will need to recheck your levels in 6 weeks.   Please take two Vitamin D supplements per day-- we can recheck this in 3 months.   Consider some of the shoe brands on this website: [email or web address]  I am contacting brandon to see if he can help with your pain management.

## 2022-10-30 NOTE — Progress Notes (Signed)
Location:  [facility] clinic [facility].   Provider: Dr. Earnestine Mealing   Code Status: DNR Goals of Care:     10/30/2022    2:10 PM  Advanced Directives  Does Patient Have a Medical Advance Directive? Yes  Type of Estate agent of [place];Out of facility DNR (pink MOST or yellow form);Living will  Does patient want to make changes to medical advance directive? No - Patient declined  Copy of Healthcare Power of Attorney in Chart? Yes - validated most recent copy scanned in chart (See row information)     Chief Complaint  Patient presents with  . Medical Management of Chronic Issues    Medical Management of Chronic Issues.     HPI: Patient is a 87 y.o. female seen today for medical management of chronic diseases.     Past Medical History:  Diagnosis Date  . Arthritis   . Complication of anesthesia   . Depression   . Hyperlipidemia   . Osteopenia   . Osteopenia   . Sleep disorder   . Thyroid disease     Past Surgical History:  Procedure Laterality Date  . ABDOMINAL HYSTERECTOMY    . APPENDECTOMY    . CATARACT EXTRACTION W/PHACO Right 03/03/2019   Procedure: CATARACT EXTRACTION PHACO AND INTRAOCULAR LENS PLACEMENT (IOC) RIGHT  00:44.0  16.1%  7.12;  Surgeon: Lockie Mola, MD;  Location: [facility];  Service: Ophthalmology;  Laterality: Right;  . CATARACT EXTRACTION W/PHACO Left 03/24/2019   Procedure: CATARACT EXTRACTION PHACO AND INTRAOCULAR LENS PLACEMENT (IOC) LEFT  01:09.4  12.5%  8.73;  Surgeon: Lockie Mola, MD;  Location: [facility];  Service: Ophthalmology;  Laterality: Left;  . IR RADIOLOGIST EVAL & MGMT  12/24/2021  . IR RADIOLOGIST EVAL & MGMT  01/17/2022  . TONSILLECTOMY AND ADENOIDECTOMY  1946    No Known Allergies  Outpatient Encounter Medications as of 10/30/2022  Medication Sig  . acetaminophen (TYLENOL) 500 MG tablet Take Two tablets in the morning and Take Two tablets at bedtime.  . Ascorbic Acid  (VITAMIN C) 1000 MG tablet Take 1,000 mg by mouth daily.  Marland Kitchen atorvastatin (LIPITOR) 80 MG tablet TAKE 1 TABLET DAILY  . cholecalciferol (VITAMIN D) 1000 UNITS tablet Take 1,000 Units by mouth daily.  . clopidogrel (PLAVIX) 75 MG tablet Take 1 tablet (75 mg total) by mouth daily.  Marland Kitchen levothyroxine (SYNTHROID) 88 MCG tablet Take 1 tablet (88 mcg total) by mouth daily.  . metoprolol succinate (TOPROL-XL) 25 MG 24 hr tablet Take 1 tablet (25 mg total) by mouth daily. Take with or immediately following a meal.  . mirabegron ER (MYRBETRIQ) 50 MG TB24 tablet Take 1 tablet (50 mg total) by mouth daily.  . mirtazapine (REMERON) 45 MG tablet TAKE 1 TABLET AT BEDTIME (CHANGING BACK TO HIGHER DOSE)  . Multiple Vitamins-Minerals (ICAPS AREDS 2 PO) Take by mouth.  . Polyethylene Glycol 3350 (MIRALAX PO) Take by mouth.  . risedronate (ACTONEL) 150 MG tablet Take 1 tablet (150 mg total) by mouth every 30 (thirty) days. with water on empty stomach, nothing by mouth or lie down for next 30 minutes.   No facility-administered encounter medications on file as of 10/30/2022.    Review of Systems:  Review of Systems  Health Maintenance  Topic Date Due  . MAMMOGRAM  09/30/2029 (Originally 03/31/2020)  . INFLUENZA VACCINE  01/02/2023  . Medicare Annual Wellness (AWV)  01/30/2023  . DTaP/Tdap/Td (3 - Td or Tdap) 04/06/2032  . Pneumonia  Vaccine 39+ Years old  Completed  . DEXA SCAN  Completed  . COVID-19 Vaccine  Completed  . Zoster Vaccines- Shingrix  Completed  . HPV VACCINES  Aged Out    Physical Exam: Vitals:   10/30/22 1409  BP: 134/78  Pulse: 72  Temp: (!) 96.9 F (36.1 C)  SpO2: 96%  Weight: 139 lb (63 kg)  Height: 5\' 2"  (1.575 m)   Body mass index is 25.42 kg/m. Physical Exam  Labs reviewed: Basic Metabolic Panel: Recent Labs    12/25/21 0000 10/21/22 0800  NA 139 138  K 4.6 4.4  CL 104 103  CO2 22 26  GLUCOSE 65 86  BUN 29* 21  CREATININE 0.94 1.00*  CALCIUM 8.7 8.8  TSH  --   11.05*   Liver Function Tests: Recent Labs    12/25/21 0000 10/21/22 0800  AST 31 35  ALT 36* 46*  BILITOT 0.4 0.4  PROT 6.6 6.7   No results for input(s): "LIPASE", "AMYLASE" in the last 8760 hours. No results for input(s): "AMMONIA" in the last 8760 hours. CBC: Recent Labs    12/25/21 0000 10/21/22 0800  WBC 5.0 4.9  NEUTROABS  --  2,950  HGB 11.9 12.0  HCT 36.0 36.6  MCV 95.5 95.3  PLT 218 263   Lipid Panel: No results for input(s): "CHOL", "HDL", "LDLCALC", "TRIG", "CHOLHDL", "LDLDIRECT" in the last 8760 hours. Lab Results  Component Value Date   HGBA1C 5.9 (H) 07/27/2021    Procedures since last visit: No results found.  Assessment/Plan There are no diagnoses linked to this encounter.   Labs/tests ordered:  * No order type specified * Next appt:  Visit date not found

## 2022-11-04 DIAGNOSIS — M79674 Pain in right toe(s): Secondary | ICD-10-CM | POA: Insufficient documentation

## 2022-11-04 DIAGNOSIS — M21612 Bunion of left foot: Secondary | ICD-10-CM | POA: Insufficient documentation

## 2022-11-08 ENCOUNTER — Encounter: Payer: Self-pay | Admitting: Cardiology

## 2022-11-08 ENCOUNTER — Ambulatory Visit: Payer: Medicare Other | Attending: Cardiology | Admitting: Cardiology

## 2022-11-08 VITALS — BP 120/62 | HR 68 | Ht 64.0 in | Wt 138.0 lb

## 2022-11-08 DIAGNOSIS — I6389 Other cerebral infarction: Secondary | ICD-10-CM | POA: Diagnosis not present

## 2022-11-08 DIAGNOSIS — I471 Supraventricular tachycardia, unspecified: Secondary | ICD-10-CM | POA: Insufficient documentation

## 2022-11-08 MED ORDER — METOPROLOL SUCCINATE ER 25 MG PO TB24: 25.0000 mg | ORAL_TABLET | Freq: Every day | ORAL | 3 refills | Status: DC

## 2022-11-08 NOTE — Patient Instructions (Signed)
Medication Instructions:   Your physician recommends that you continue on your current medications as directed. Please refer to the Current Medication list given to you today.  *If you need a refill on your cardiac medications before your next appointment, please call your pharmacy*   Lab Work:  None Ordered  If you have labs (blood work) drawn today and your tests are completely normal, you will receive your results only by: MyChart Message (if you have MyChart) OR A paper copy in the mail If you have any lab test that is abnormal or we need to change your treatment, we will call you to review the results.   Testing/Procedures:  None Ordered    Follow-Up: At [facility], you and your health needs are our priority.  As part of our continuing mission to provide you with exceptional heart care, we have created designated Provider Care Teams.  These Care Teams include your primary Cardiologist (physician) and Advanced Practice Providers (APPs -  Physician Assistants and Nurse Practitioners) who all work together to provide you with the care you need, when you need it.  We recommend signing up for the patient portal called "MyChart".  Sign up information is provided on this After Visit Summary.  MyChart is used to connect with patients for Virtual Visits (Telemedicine).  Patients are able to view lab/test results, encounter notes, upcoming appointments, etc.  Non-urgent messages can be sent to your provider as well.   To learn more about what you can do with MyChart, go to [email or web address].    Your next appointment:   12 month(s)  Provider:   You may see Brian Agbor-Etang, MD or one of the following Advanced Practice Providers on your designated Care Team:   Christopher Berge, NP Ryan Dunn, PA-C Cadence Furth, PA-C Sheri Hammock, NP  

## 2022-11-08 NOTE — Progress Notes (Signed)
Cardiology Office Note:    Date:  11/08/2022   ID:  Robin Logan, DOB 1935/09/21, MRN [id number]  PCP:  Earnestine Mealing, MD  [facility] Cardiologist:  Debbe Odea, MD  [facility] Electrophysiologist:  None   Referring MD: Earnestine Mealing, MD   Chief Complaint  Patient presents with   Follow-up    Patient denies new or acute cardiac problems/concerns today.  Needing refill on Metoprolol (rx pended for MD review).    History of Present Illness:    Robin Logan is a 87 y.o. female with a hx of hyperlipidemia, paroxysmal SVT, TIA, COPD, former smoker, spinal stenosis who presents for hospital follow-up.   Complains of lower back pain due to spinal stenosis, surgical therapy has been recommended but patient does not want to undergo surgery.  Currently doing yoga and exercises to help with back pain, takes occasional Tylenol.  Denies palpitations, compliant with Toprol-XL as prescribed.  No new concerns at this time.  Prior notes Cardiac monitor: 08/27/2021 showed paroxysmal supraventricular tachycardia runs, no evidence for atrial fibrillation or atrial flutter. Echocardiogram 11/2019, normal systolic function, EF 60 to 65%. Ultrasound 11/2019 bilateral arthrosclerosis plaque right greater than left.  No hemodynamically significant stenosis. Admitted to the hospital with TIA, telemetry monitoring showed SVT heart rates up to 149, Cardizem was administered with resolution.    Past Medical History:  Diagnosis Date   Arthritis    Complication of anesthesia    Depression    Hyperlipidemia    Osteopenia    Osteopenia    Sleep disorder    Thyroid disease     Past Surgical History:  Procedure Laterality Date   ABDOMINAL HYSTERECTOMY     APPENDECTOMY     CATARACT EXTRACTION W/PHACO Right 03/03/2019   Procedure: CATARACT EXTRACTION PHACO AND INTRAOCULAR LENS PLACEMENT (IOC) RIGHT  00:44.0  16.1%  7.12;  Surgeon: Lockie Mola, MD;  Location: [facility];  Service: Ophthalmology;  Laterality: Right;   CATARACT EXTRACTION W/PHACO Left 03/24/2019   Procedure: CATARACT EXTRACTION PHACO AND INTRAOCULAR LENS PLACEMENT (IOC) LEFT  01:09.4  12.5%  8.73;  Surgeon: Lockie Mola, MD;  Location: [facility];  Service: Ophthalmology;  Laterality: Left;   IR RADIOLOGIST EVAL & MGMT  12/24/2021   IR RADIOLOGIST EVAL & MGMT  01/17/2022   TONSILLECTOMY AND ADENOIDECTOMY  1946    Current Medications: Current Meds  Medication Sig   acetaminophen (TYLENOL) 500 MG tablet Take Two tablets in the morning and Take Two tablets at bedtime.   Ascorbic Acid (VITAMIN C) 1000 MG tablet Take 1,000 mg by mouth daily.   atorvastatin (LIPITOR) 80 MG tablet TAKE 1 TABLET DAILY   cholecalciferol (VITAMIN D) 1000 UNITS tablet Take 1,000 Units by mouth daily.   clopidogrel (PLAVIX) 75 MG tablet Take 1 tablet (75 mg total) by mouth daily.   levothyroxine (SYNTHROID) 100 MCG tablet Take 1 tablet (100 mcg total) by mouth daily.   mirabegron ER (MYRBETRIQ) 50 MG TB24 tablet Take 1 tablet (50 mg total) by mouth daily.   mirtazapine (REMERON) 45 MG tablet TAKE 1 TABLET AT BEDTIME (CHANGING BACK TO HIGHER DOSE)   Multiple Vitamins-Minerals (ICAPS AREDS 2 PO) Take by mouth.   Polyethylene Glycol 3350 (MIRALAX PO) Take by mouth.   risedronate (ACTONEL) 150 MG tablet Take 1 tablet (150 mg total) by mouth every 30 (thirty) days. with water on empty stomach, nothing by mouth or lie down for next 30 minutes.   [DISCONTINUED] metoprolol succinate (  TOPROL-XL) 25 MG 24 hr tablet Take 1 tablet (25 mg total) by mouth daily. Take with or immediately following a meal.     Allergies:   Patient has no known allergies.   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  Tobacco Use   Smoking status: Former    Types: Cigarettes    Quit date: 06/03/1978    Years since quitting: 44.4    Passive exposure: Past   Smokeless tobacco: Never  Vaping Use   Vaping Use: Never used  Substance and Sexual Activity   Alcohol use: Yes    Alcohol/week: 0.0 standard drinks of alcohol    Comment: wine   Drug use: No   Sexual activity: Not on file  Other Topics Concern   Not on file  Social History Narrative   Requests DNR --order done 12/24/10 and now on display   Has living will   Husband is health care POA---alternate would be nieces (both sides)   No tube feedings if cognitively unaware   Social Determinants of Health   Financial Resource Strain: Not on file  Food Insecurity: Not on file  Transportation Needs: Not on file  Physical Activity: Not on file  Stress: Not on file  Social Connections: Not on file     Family History: The patient's family history includes Diabetes in her brother; Hypertension in her father; Stroke in her brother. There is no history of Cancer.  ROS:   Please see the history of present illness.     All other systems reviewed and are negative.  EKGs/Labs/Other Studies Reviewed:    The following studies were reviewed today:   EKG:  EKG is  ordered today.  The ekg ordered today demonstrates normal sinus rhythm  Recent Labs: 10/21/2022: ALT 46; BUN 21; Creat 1.00; Hemoglobin 12.0; Platelets 263; Potassium 4.4; Sodium 138; TSH 11.05  Recent Lipid Panel    Component Value Date/Time   CHOL 173 07/28/2021 0447   TRIG 56 07/28/2021 0447   HDL 71 07/28/2021 0447   CHOLHDL 2.4 07/28/2021 0447   VLDL 11 07/28/2021 0447   LDLCALC 91 07/28/2021 0447   LDLDIRECT 161.5 11/30/2012 1553    Physical Exam:    VS:  BP 120/62 (BP Location: Left Arm, Patient Position: Sitting, Cuff Size: Normal)   Pulse 68   Ht 5\' 4"  (1.626 m)   Wt 138 lb (62.6 kg)   SpO2 96%   BMI 23.69 kg/m     Wt Readings  from Last 3 Encounters:  11/08/22 138 lb (62.6 kg)  10/30/22 139 lb (63 kg)  09/23/22 140 lb (63.5 kg)     GEN:  Well nourished, well developed in no acute distress HEENT: Normal NECK: No JVD; No carotid bruits CARDIAC: RRR, no murmurs, rubs, gallops RESPIRATORY:  Clear to auscultation without rales, wheezing or rhonchi  ABDOMEN: Soft, non-tender, non-distended MUSCULOSKELETAL:  No edema; low back tenderness. SKIN: Warm and dry NEUROLOGIC:  Alert and oriented x 3 PSYCHIATRIC:  Normal affect   ASSESSMENT:    1. Paroxysmal SVT (supraventricular tachycardia)   2. Other cerebral infarction [facility])    PLAN:    In order of problems listed above:  paroxysmal SVT.  Denies palpitations, continue Toprol-XL 25 mg daily.  EF 60 to 65%. History of CVA.  Continue Plavix, Lipitor as prescribed.  Follow-up yearly.   Medication Adjustments/Labs and Tests Ordered: Current medicines are reviewed at length with the patient today.  Concerns regarding medicines are outlined above.  Orders Placed This Encounter  Procedures   EKG 12-Lead    Meds ordered this encounter  Medications   metoprolol succinate (TOPROL-XL) 25 MG 24 hr tablet    Sig: Take 1 tablet (25 mg total) by mouth daily. Take with or immediately following a meal.    Dispense:  90 tablet    Refill:  3     Patient Instructions  Medication Instructions:   Your physician recommends that you continue on your current medications as directed. Please refer to the Current Medication list given to you today.  *If you need a refill on your cardiac medications before your next appointment, please call your pharmacy*   Lab Work:  None Ordered  If you have labs (blood work) drawn today and your tests are completely normal, you will receive your results only by: MyChart Message (if you have MyChart) OR A paper copy in the mail If you have any lab test that is abnormal or we need to change your treatment, we will call you to  review the results.   Testing/Procedures:  None Ordered    Follow-Up: At [facility], you and your health needs are our priority.  As part of our continuing mission to provide you with exceptional heart care, we have created designated Provider Care Teams.  These Care Teams include your primary Cardiologist (physician) and Advanced Practice Providers (APPs -  Physician Assistants and Nurse Practitioners) who all work together to provide you with the care you need, when you need it.  We recommend signing up for the patient portal called "MyChart".  Sign up information is provided on this After Visit Summary.  MyChart is used to connect with patients for Virtual Visits (Telemedicine).  Patients are able to view lab/test results, encounter notes, upcoming appointments, etc.  Non-urgent messages can be sent to your provider as well.   To learn more about what you can do with MyChart, go to [email or web address].    Your next appointment:   12 month(s)  Provider:   You may see Debbe Odea, MD or one of the following Advanced Practice Providers on your designated Care Team:   Nicolasa Ducking, NP Eula Listen, PA-C Cadence Fransico Michael, PA-C Charlsie Quest, NP   Signed, Debbe Odea, MD  11/08/2022 5:04 PM    [facility]

## 2022-11-12 DIAGNOSIS — M79674 Pain in right toe(s): Secondary | ICD-10-CM | POA: Diagnosis not present

## 2022-11-12 DIAGNOSIS — M21612 Bunion of left foot: Secondary | ICD-10-CM | POA: Diagnosis not present

## 2022-11-19 DIAGNOSIS — M21612 Bunion of left foot: Secondary | ICD-10-CM | POA: Diagnosis not present

## 2022-11-19 DIAGNOSIS — M79674 Pain in right toe(s): Secondary | ICD-10-CM | POA: Diagnosis not present

## 2022-11-19 DIAGNOSIS — H40003 Preglaucoma, unspecified, bilateral: Secondary | ICD-10-CM | POA: Diagnosis not present

## 2022-11-26 DIAGNOSIS — H26492 Other secondary cataract, left eye: Secondary | ICD-10-CM | POA: Diagnosis not present

## 2022-11-26 DIAGNOSIS — Z961 Presence of intraocular lens: Secondary | ICD-10-CM | POA: Diagnosis not present

## 2022-11-26 DIAGNOSIS — H43813 Vitreous degeneration, bilateral: Secondary | ICD-10-CM | POA: Diagnosis not present

## 2022-11-26 DIAGNOSIS — H40003 Preglaucoma, unspecified, bilateral: Secondary | ICD-10-CM | POA: Diagnosis not present

## 2022-11-27 ENCOUNTER — Other Ambulatory Visit: Payer: Self-pay | Admitting: Internal Medicine

## 2022-11-28 ENCOUNTER — Other Ambulatory Visit: Payer: Self-pay

## 2022-11-28 ENCOUNTER — Encounter: Payer: Self-pay | Admitting: Student

## 2022-11-28 MED ORDER — ATORVASTATIN CALCIUM 80 MG PO TABS: 80.0000 mg | ORAL_TABLET | Freq: Every day | ORAL | 3 refills | Status: DC

## 2022-12-12 DIAGNOSIS — M8000XA Age-related osteoporosis with current pathological fracture, unspecified site, initial encounter for fracture: Secondary | ICD-10-CM | POA: Diagnosis not present

## 2022-12-12 DIAGNOSIS — E039 Hypothyroidism, unspecified: Secondary | ICD-10-CM | POA: Diagnosis not present

## 2022-12-13 ENCOUNTER — Telehealth: Payer: Medicare Other | Admitting: Student

## 2022-12-13 DIAGNOSIS — E039 Hypothyroidism, unspecified: Secondary | ICD-10-CM

## 2022-12-13 LAB — TSH: TSH: 5.8 mIU/L — ABNORMAL HIGH (ref 0.40–4.50)

## 2022-12-13 MED ORDER — LEVOTHYROXINE SODIUM 112 MCG PO TABS
112.0000 ug | ORAL_TABLET | Freq: Every day | ORAL | 2 refills | Status: DC
Start: 2022-12-13 — End: 2022-12-23

## 2022-12-13 NOTE — Telephone Encounter (Signed)
Lab Order Printed and placed in tray at [facility] to be drawn on 02/13/2023.

## 2022-12-13 NOTE — Telephone Encounter (Signed)
Called patient and spoke to her husband. She is at Yoga until 10:30. Patient needs to have higher dose of levothyroxine. [facility] She will need to have labs collected again in 2 months.

## 2022-12-18 ENCOUNTER — Encounter: Payer: Self-pay | Admitting: Student

## 2022-12-23 ENCOUNTER — Telehealth: Payer: Self-pay | Admitting: Student

## 2022-12-23 DIAGNOSIS — E039 Hypothyroidism, unspecified: Secondary | ICD-10-CM

## 2022-12-23 MED ORDER — LEVOTHYROXINE SODIUM 112 MCG PO TABS
112.0000 ug | ORAL_TABLET | Freq: Every day | ORAL | 2 refills | Status: DC
Start: 2022-12-23 — End: 2023-01-27

## 2022-12-23 NOTE — Telephone Encounter (Signed)
Called patient to discuss her concerns regarding hypothyroidism. She had confusion about how her hypothyroidism could cause elevated TSH. Discussed the mechanism of TSH and Thyroid hormone as well as levothyroxine. All questions answered. Patient will need a refill of 112 mcg to have before her next appt on 8/30. [facility]   Patient - in her home Provider- In clinic  I spent greater than 8 minutes for the care of this patient on the telephone, in chart review, clinical documentation, patient education.

## 2023-01-15 DIAGNOSIS — I7091 Generalized atherosclerosis: Secondary | ICD-10-CM | POA: Diagnosis not present

## 2023-01-15 DIAGNOSIS — B351 Tinea unguium: Secondary | ICD-10-CM | POA: Diagnosis not present

## 2023-01-27 ENCOUNTER — Telehealth: Payer: Self-pay | Admitting: Student

## 2023-01-27 DIAGNOSIS — E039 Hypothyroidism, unspecified: Secondary | ICD-10-CM

## 2023-01-27 LAB — TSH: TSH: 2.55 mIU/L (ref 0.40–4.50)

## 2023-01-27 MED ORDER — LEVOTHYROXINE SODIUM 112 MCG PO TABS
112.0000 ug | ORAL_TABLET | Freq: Every day | ORAL | 2 refills | Status: DC
Start: 2023-01-27 — End: 2023-08-08

## 2023-01-27 NOTE — Telephone Encounter (Signed)
Called patient to notify of most recent labs. Doing well. Refill sent to mail order pharmacy.

## 2023-01-31 ENCOUNTER — Ambulatory Visit: Payer: Medicare Other | Admitting: Student

## 2023-01-31 ENCOUNTER — Encounter: Payer: Self-pay | Admitting: Student

## 2023-01-31 VITALS — BP 126/70 | HR 65 | Temp 97.8°F | Ht 64.0 in | Wt 138.0 lb

## 2023-01-31 DIAGNOSIS — G4700 Insomnia, unspecified: Secondary | ICD-10-CM | POA: Diagnosis not present

## 2023-01-31 DIAGNOSIS — F39 Unspecified mood [affective] disorder: Secondary | ICD-10-CM

## 2023-01-31 DIAGNOSIS — N3281 Overactive bladder: Secondary | ICD-10-CM

## 2023-01-31 DIAGNOSIS — M8000XA Age-related osteoporosis with current pathological fracture, unspecified site, initial encounter for fracture: Secondary | ICD-10-CM

## 2023-01-31 DIAGNOSIS — I700 Atherosclerosis of aorta: Secondary | ICD-10-CM | POA: Diagnosis not present

## 2023-01-31 DIAGNOSIS — Z66 Do not resuscitate: Secondary | ICD-10-CM | POA: Diagnosis not present

## 2023-01-31 DIAGNOSIS — G3184 Mild cognitive impairment, so stated: Secondary | ICD-10-CM

## 2023-01-31 DIAGNOSIS — I69311 Memory deficit following cerebral infarction: Secondary | ICD-10-CM

## 2023-01-31 DIAGNOSIS — E039 Hypothyroidism, unspecified: Secondary | ICD-10-CM

## 2023-01-31 DIAGNOSIS — I639 Cerebral infarction, unspecified: Secondary | ICD-10-CM

## 2023-01-31 NOTE — Patient Instructions (Addendum)
Please cut your mirtazapine in 1/2 (this will be 22.5 mg) and take the half dose nightly for 3 weeks Start on September 1 until September 21.   On September 22, please take 15 mg tablet nightly for 2 weeks, then on October 1, start taking 1/2 tablet (7.5 mg) then stop on October 15.   I'll see you in October to discuss how you are doing.   Consider increasing your exercise to 5x weekly.

## 2023-01-31 NOTE — Progress Notes (Addendum)
Location:   [facility]   Place of Service:   [facility]   Provider: Sydnee Cabal  Code Status: DNR Goals of Care:     01/31/2023   11:46 AM  Advanced Directives  Does Patient Have a Medical Advance Directive? Yes  Type of Estate agent of [place];Living will;Out of facility DNR (pink MOST or yellow form)  Does patient want to make changes to medical advance directive? No - Patient declined  Copy of Healthcare Power of Attorney in Chart? Yes - validated most recent copy scanned in chart (See row information)  Pre-existing out of facility DNR order (yellow form or pink MOST form) Yellow form placed in chart (order not valid for inpatient use)     Chief Complaint  Patient presents with   Medical Management of Chronic Issues    3 month follow-up. Discuss need for flu vaccine, and annual wellness.     HPI: Patient is a 87 y.o. female seen today for medical management of chronic diseases.    She has gained some weight recent years. She is used to being 125 when she feels good about herself.   Caregiver issues -- she watches him struggle and worries about it. She eats some comfort foo.   They don't eat after 7PM. She tries to have healthy fruit based snacks.   She thinks she is having too many sandwiches. Some frozen meals. Microwavable. She has a poached egg and a piece of toast. Plain greek yogurt with splenda with some frozen fruit. She has some fiber one cereal for   Exercise she does yoga - she does 3 days per week. She does the bicycle before she goes to Yoga.    Past Medical History:  Diagnosis Date   Arthritis    Complication of anesthesia    Depression    Hyperlipidemia    Osteopenia    Osteopenia    Sleep disorder    Thyroid disease     Past Surgical History:  Procedure Laterality Date   ABDOMINAL HYSTERECTOMY     APPENDECTOMY     CATARACT EXTRACTION W/PHACO Right 03/03/2019   Procedure: CATARACT EXTRACTION PHACO AND INTRAOCULAR LENS  PLACEMENT (IOC) RIGHT  00:44.0  16.1%  7.12;  Surgeon: Lockie Mola, MD;  Location: [facility];  Service: Ophthalmology;  Laterality: Right;   CATARACT EXTRACTION W/PHACO Left 03/24/2019   Procedure: CATARACT EXTRACTION PHACO AND INTRAOCULAR LENS PLACEMENT (IOC) LEFT  01:09.4  12.5%  8.73;  Surgeon: Lockie Mola, MD;  Location: [facility];  Service: Ophthalmology;  Laterality: Left;   IR RADIOLOGIST EVAL & MGMT  12/24/2021   IR RADIOLOGIST EVAL & MGMT  01/17/2022   TONSILLECTOMY AND ADENOIDECTOMY  1944    No Known Allergies  Outpatient Encounter Medications as of 01/31/2023  Medication Sig   acetaminophen (TYLENOL) 500 MG tablet Take Two tablets in the morning and Take Two tablets at bedtime.   Ascorbic Acid (VITAMIN C) 1000 MG tablet Take 1,000 mg by mouth daily.   atorvastatin (LIPITOR) 80 MG tablet Take 1 tablet (80 mg total) by mouth daily.   cholecalciferol (VITAMIN D) 1000 UNITS tablet Take 1,000 Units by mouth daily.   clopidogrel (PLAVIX) 75 MG tablet Take 1 tablet (75 mg total) by mouth daily.   levothyroxine (SYNTHROID) 112 MCG tablet Take 1 tablet (112 mcg total) by mouth daily.   metoprolol succinate (TOPROL-XL) 25 MG 24 hr tablet Take 1 tablet (25 mg total) by mouth daily. Take  with or immediately following a meal.   mirabegron ER (MYRBETRIQ) 50 MG TB24 tablet Take 1 tablet (50 mg total) by mouth daily.   mirtazapine (REMERON) 45 MG tablet TAKE 1 TABLET AT BEDTIME (CHANGING BACK TO HIGHER DOSE)   Multiple Vitamins-Minerals (ICAPS AREDS 2 PO) Take by mouth.   Polyethylene Glycol 3350 (MIRALAX PO) Take by mouth.   [DISCONTINUED] risedronate (ACTONEL) 150 MG tablet Take 1 tablet (150 mg total) by mouth every 30 (thirty) days. with water on empty stomach, nothing by mouth or lie down for next 30 minutes.   No facility-administered encounter medications on file as of 01/31/2023.    Review of Systems:  Review of Systems  Health Maintenance  Topic  Date Due   INFLUENZA VACCINE  01/02/2023   Medicare Annual Wellness (AWV)  01/30/2023   COVID-19 Vaccine (13 - 2023-24 season) 02/02/2023   MAMMOGRAM  09/30/2029 (Originally 03/31/2020)   DTaP/Tdap/Td (3 - Td or Tdap) 04/06/2032   Pneumonia Vaccine 80+ Years old  Completed   DEXA SCAN  Completed   Zoster Vaccines- Shingrix  Completed   HPV VACCINES  Aged Out    Physical Exam: Vitals:   01/31/23 1145  BP: 126/70  Pulse: 65  Temp: 97.8 F (36.6 C)  TempSrc: Temporal  SpO2: 99%  Weight: 138 lb (62.6 kg)  Height: 5\' 4"  (1.626 m)   Body mass index is 23.69 kg/m. Physical Exam Constitutional:      Appearance: Normal appearance.  Cardiovascular:     Rate and Rhythm: Normal rate.     Pulses: Normal pulses.  Pulmonary:     Effort: Pulmonary effort is normal.  Abdominal:     Palpations: Abdomen is soft.  Neurological:     Mental Status: She is alert and oriented to person, place, and time. Mental status is at baseline.     Labs reviewed: Basic Metabolic Panel: Recent Labs    10/21/22 0800 12/12/22 0736 01/27/23 0730  NA 138  --   --   K 4.4  --   --   CL 103  --   --   CO2 26  --   --   GLUCOSE 86  --   --   BUN 21  --   --   CREATININE 1.00*  --   --   CALCIUM 8.8  --   --   TSH 11.05* 5.80* 2.55   Liver Function Tests: Recent Labs    10/21/22 0800  AST 35  ALT 46*  BILITOT 0.4  PROT 6.7   No results for input(s): "LIPASE", "AMYLASE" in the last 8760 hours. No results for input(s): "AMMONIA" in the last 8760 hours. CBC: Recent Labs    10/21/22 0800  WBC 4.9  NEUTROABS 2,950  HGB 12.0  HCT 36.6  MCV 95.3  PLT 263   Lipid Panel: No results for input(s): "CHOL", "HDL", "LDLCALC", "TRIG", "CHOLHDL", "LDLDIRECT" in the last 8760 hours. Lab Results  Component Value Date   HGBA1C 5.9 (H) 07/27/2021    Procedures since last visit: No results found.  Assessment/Plan Insomnia, unspecified type  Mood disorder (HCC), Chronic  Aortic  atherosclerosis (HCC), Chronic  Do not resuscitate  Hypothyroidism, unspecified type  Age-related osteoporosis with current pathological fracture, initial encounter  Overactive bladder  Memory deficit due to and not concurrent with cerebrovascular accident (CVA) Patient here today for routine f/u. Thyroid levels at goal, continue current medication. Patient concerned about 10 lb weight gain over the last 2 years. Discussed  she is at a healthy weight and important to maintain weight at her age as well as bone health. Discussed possibility of mirtazapine leading to weight gain -- although it has been in the last 2 years and she has had the medication for 12. Discussed stress management. Encourage exercise. Discussed support in the home, she states they only need help with groceries. F/u in 6 weeks for sleep to determine need for an alternative medication and to follow up on weight with increase in physical activity.   Labs/tests ordered:  * No order type specified * Next appt:  Visit date not found

## 2023-02-02 DIAGNOSIS — M25511 Pain in right shoulder: Secondary | ICD-10-CM | POA: Insufficient documentation

## 2023-02-10 ENCOUNTER — Other Ambulatory Visit: Payer: Self-pay | Admitting: *Deleted

## 2023-02-10 MED ORDER — RISEDRONATE SODIUM 150 MG PO TABS: 150.0000 mg | ORAL_TABLET | ORAL | 3 refills | Status: DC

## 2023-02-10 NOTE — Telephone Encounter (Signed)
Patient called requesting refill, Left message  on Clinical intake.   Not sure what pharmacy patient wants the Rx to go to. LMOM for patient to return call.

## 2023-02-10 NOTE — Telephone Encounter (Signed)
Patient requested Rx to be sent to [facility].  

## 2023-02-18 DIAGNOSIS — I69311 Memory deficit following cerebral infarction: Secondary | ICD-10-CM | POA: Insufficient documentation

## 2023-02-19 ENCOUNTER — Encounter: Payer: Self-pay | Admitting: Student

## 2023-02-19 ENCOUNTER — Ambulatory Visit: Payer: Medicare Other | Admitting: Student

## 2023-02-19 VITALS — BP 138/86 | HR 76 | Temp 97.4°F | Ht 64.0 in | Wt 139.0 lb

## 2023-02-19 DIAGNOSIS — F39 Unspecified mood [affective] disorder: Secondary | ICD-10-CM | POA: Diagnosis not present

## 2023-02-19 DIAGNOSIS — M25511 Pain in right shoulder: Secondary | ICD-10-CM | POA: Diagnosis not present

## 2023-02-19 NOTE — Patient Instructions (Signed)
I have written a consult to physical therapy for your shoulder pain. They will contact you to schedule a time.

## 2023-02-19 NOTE — Progress Notes (Signed)
[facility]   Provider: Dr. Earnestine Mealing  Code Status: DNR Goals of Care:     02/19/2023    2:53 PM  Advanced Directives  Does Patient Have a Medical Advance Directive? Yes  Type of Estate agent of [place];Out of facility DNR (pink MOST or yellow form);Living will  Does patient want to make changes to medical advance directive? No - Patient declined  Copy of Healthcare Power of Attorney in Chart? Yes - validated most recent copy scanned in chart (See row information)     Chief Complaint  Patient presents with   Acute Visit    Complains of Right Shoulder Pain. Pain started a week ago in Yoga Class.     HPI: Patient is a 87 y.o. female seen today for an acute visit for Right Shoulder Pain. Pain started a week ago in Yoga Class.   She is sleeping well with the lower dose of mirtazapine.   Her husband has more problems. He cries more she doesn't know how to help him.   She goes to Yoga 3x week and some other exercise 5x per week. She started having right shoulder about 1 week ago. She was leaning propped on the elbow when she noticed the pain. No pops or cracks at that time.   Past Medical History:  Diagnosis Date   Arthritis    Complication of anesthesia    Depression    Hyperlipidemia    Osteopenia    Osteopenia    Sleep disorder    Thyroid disease     Past Surgical History:  Procedure Laterality Date   ABDOMINAL HYSTERECTOMY     APPENDECTOMY     CATARACT EXTRACTION W/PHACO Right 03/03/2019   Procedure: CATARACT EXTRACTION PHACO AND INTRAOCULAR LENS PLACEMENT (IOC) RIGHT  00:44.0  16.1%  7.12;  Surgeon: Lockie Mola, MD;  Location: [facility];  Service: Ophthalmology;  Laterality: Right;   CATARACT EXTRACTION W/PHACO Left 03/24/2019   Procedure: CATARACT EXTRACTION PHACO AND INTRAOCULAR LENS PLACEMENT (IOC) LEFT  01:09.4  12.5%  8.73;  Surgeon: Lockie Mola, MD;  Location: [facility];  Service:  Ophthalmology;  Laterality: Left;   IR RADIOLOGIST EVAL & MGMT  12/24/2021   IR RADIOLOGIST EVAL & MGMT  01/17/2022   TONSILLECTOMY AND ADENOIDECTOMY  1944    No Known Allergies  Outpatient Encounter Medications as of 02/19/2023  Medication Sig   acetaminophen (TYLENOL) 500 MG tablet Take Two tablets in the morning and Take Two tablets at bedtime.   Ascorbic Acid (VITAMIN C) 1000 MG tablet Take 1,000 mg by mouth daily.   atorvastatin (LIPITOR) 80 MG tablet Take 1 tablet (80 mg total) by mouth daily.   cholecalciferol (VITAMIN D) 1000 UNITS tablet Take 1,000 Units by mouth daily.   clopidogrel (PLAVIX) 75 MG tablet Take 1 tablet (75 mg total) by mouth daily.   levothyroxine (SYNTHROID) 112 MCG tablet Take 1 tablet (112 mcg total) by mouth daily.   metoprolol succinate (TOPROL-XL) 25 MG 24 hr tablet Take 1 tablet (25 mg total) by mouth daily. Take with or immediately following a meal.   mirabegron ER (MYRBETRIQ) 50 MG TB24 tablet Take 1 tablet (50 mg total) by mouth daily.   mirtazapine (REMERON) 45 MG tablet TAKE 1 TABLET AT BEDTIME (CHANGING BACK TO HIGHER DOSE) (Patient taking differently: Taking 1/2 tablet by mouth at bedtime.)   Multiple Vitamins-Minerals (ICAPS AREDS 2 PO) Take by mouth.   Polyethylene Glycol 3350 (MIRALAX PO) Take  by mouth.   risedronate (ACTONEL) 150 MG tablet Take 1 tablet (150 mg total) by mouth every 30 (thirty) days. with water on empty stomach, nothing by mouth or lie down for next 30 minutes.   No facility-administered encounter medications on file as of 02/19/2023.    Review of Systems:  Review of Systems  Health Maintenance  Topic Date Due   INFLUENZA VACCINE  01/02/2023   Medicare Annual Wellness (AWV)  01/30/2023   COVID-19 Vaccine (13 - 2023-24 season) 02/02/2023   MAMMOGRAM  09/30/2029 (Originally 03/31/2020)   DTaP/Tdap/Td (3 - Td or Tdap) 04/06/2032   Pneumonia Vaccine 60+ Years old  Completed   DEXA SCAN  Completed   Zoster Vaccines- Shingrix   Completed   HPV VACCINES  Aged Out    Physical Exam: Vitals:   02/19/23 1450  BP: 138/86  Pulse: 76  Temp: (!) 97.4 F (36.3 C)  SpO2: 99%  Weight: 139 lb (63 kg)  Height: 5\' 4"  (1.626 m)   Body mass index is 23.86 kg/m. Physical Exam Cardiovascular:     Rate and Rhythm: Normal rate and regular rhythm.     Pulses: Normal pulses.  Pulmonary:     Effort: Pulmonary effort is normal.     Breath sounds: Normal breath sounds.  Abdominal:     General: Abdomen is flat.     Palpations: Abdomen is soft.  Musculoskeletal:     Comments: Positive neer's test and lift off of the right shoulder. Nontender to palpation. 5/5 bicep,tricep, deltoid strength.   Neurological:     Mental Status: She is alert and oriented to person, place, and time.     Comments: Tearful when discussing her husband's decline     Labs reviewed: Basic Metabolic Panel: Recent Labs    10/21/22 0800 12/12/22 0736 01/27/23 0730  NA 138  --   --   K 4.4  --   --   CL 103  --   --   CO2 26  --   --   GLUCOSE 86  --   --   BUN 21  --   --   CREATININE 1.00*  --   --   CALCIUM 8.8  --   --   TSH 11.05* 5.80* 2.55   Liver Function Tests: Recent Labs    10/21/22 0800  AST 35  ALT 46*  BILITOT 0.4  PROT 6.7   No results for input(s): "LIPASE", "AMYLASE" in the last 8760 hours. No results for input(s): "AMMONIA" in the last 8760 hours. CBC: Recent Labs    10/21/22 0800  WBC 4.9  NEUTROABS 2,950  HGB 12.0  HCT 36.6  MCV 95.3  PLT 263   Lipid Panel: No results for input(s): "CHOL", "HDL", "LDLCALC", "TRIG", "CHOLHDL", "LDLDIRECT" in the last 8760 hours. Lab Results  Component Value Date   HGBA1C 5.9 (H) 07/27/2021    Procedures since last visit: No results found.  Assessment/Plan Acute pain of right shoulder - Plan: Ambulatory referral to Physical Therapy  Mood disorder (HCC) Will trial physical therapy. If no improvement will plan for MRI and further evaluation. Some decreased mood  due to spousal decline. Interested in talking to SW on campus.   Labs/tests ordered:  * No order type specified * Next appt:  03/21/2023

## 2023-02-26 DIAGNOSIS — M25511 Pain in right shoulder: Secondary | ICD-10-CM | POA: Diagnosis not present

## 2023-02-28 DIAGNOSIS — Z23 Encounter for immunization: Secondary | ICD-10-CM | POA: Diagnosis not present

## 2023-03-05 DIAGNOSIS — M25511 Pain in right shoulder: Secondary | ICD-10-CM | POA: Diagnosis not present

## 2023-03-06 ENCOUNTER — Other Ambulatory Visit: Payer: Self-pay | Admitting: Internal Medicine

## 2023-03-07 DIAGNOSIS — M25511 Pain in right shoulder: Secondary | ICD-10-CM | POA: Diagnosis not present

## 2023-03-10 DIAGNOSIS — M25511 Pain in right shoulder: Secondary | ICD-10-CM | POA: Diagnosis not present

## 2023-03-12 DIAGNOSIS — M25511 Pain in right shoulder: Secondary | ICD-10-CM | POA: Diagnosis not present

## 2023-03-17 DIAGNOSIS — M25511 Pain in right shoulder: Secondary | ICD-10-CM | POA: Diagnosis not present

## 2023-03-20 DIAGNOSIS — M25511 Pain in right shoulder: Secondary | ICD-10-CM | POA: Diagnosis not present

## 2023-03-21 ENCOUNTER — Ambulatory Visit: Payer: Medicare Other | Admitting: Student

## 2023-03-21 ENCOUNTER — Encounter: Payer: Self-pay | Admitting: Student

## 2023-03-21 VITALS — BP 126/78 | HR 93 | Temp 97.4°F | Ht 64.0 in | Wt 133.0 lb

## 2023-03-21 DIAGNOSIS — M25511 Pain in right shoulder: Secondary | ICD-10-CM

## 2023-03-21 DIAGNOSIS — G479 Sleep disorder, unspecified: Secondary | ICD-10-CM

## 2023-03-21 DIAGNOSIS — Z7689 Persons encountering health services in other specified circumstances: Secondary | ICD-10-CM

## 2023-03-21 DIAGNOSIS — F39 Unspecified mood [affective] disorder: Secondary | ICD-10-CM | POA: Diagnosis not present

## 2023-03-21 NOTE — Progress Notes (Unsigned)
Location[facility] [place].   Provider: Dr. Earnestine Mealing  Code Status: DNR Goals of Care:     03/21/2023   12:51 PM  Advanced Directives  Does Patient Have a Medical Advance Directive? Yes  Type of Estate agent of [place];Out of facility DNR (pink MOST or yellow form);Living will  Does patient want to make changes to medical advance directive? No - Patient declined  Copy of Healthcare Power of Attorney in Chart? Yes - validated most recent copy scanned in chart (See row information)     Chief Complaint  Patient presents with   Medical Management of Chronic Issues    Medical Management of Chronic Issues.    Quality Metric Gaps    To discuss need for Flu and AWV    HPI: Patient is a 87 y.o. female seen today for medical management of chronic diseases.    Her shoulder is much better1 month of PT left.   She has discontinue mirtazapine, and she has lost ~6 lbs! She is happy with this weight loss. She has cut back on her sweet food intake. She has been drinking some protein shakes as well to share with her husband. She has some peanut butter. Has chicken in her salads. She eats prepared skillets and has egg only without bread for breakfast. She has cut back on her sandwiches.   She has not gone to yoga until it stops hurting.   She has not been sleeping. Her husbands macular degeneration has progressed to dry and he is likely to loose his vision now. They just found out on Tuesday. He will get injections monthly.   She would like to defer a new medication at this time. Discuss further at 1 month follow up. She isn't sure if it's because of discontinuation of remeron versus all of the life changes.  Past Medical History:  Diagnosis Date   Arthritis    Complication of anesthesia    Depression    Hyperlipidemia    Osteopenia    Osteopenia    Sleep disorder    Thyroid disease     Past Surgical History:  Procedure Laterality Date    ABDOMINAL HYSTERECTOMY     APPENDECTOMY     CATARACT EXTRACTION W/PHACO Right 03/03/2019   Procedure: CATARACT EXTRACTION PHACO AND INTRAOCULAR LENS PLACEMENT (IOC) RIGHT  00:44.0  16.1%  7.12;  Surgeon: Lockie Mola, MD;  Location: [facility];  Service: Ophthalmology;  Laterality: Right;   CATARACT EXTRACTION W/PHACO Left 03/24/2019   Procedure: CATARACT EXTRACTION PHACO AND INTRAOCULAR LENS PLACEMENT (IOC) LEFT  01:09.4  12.5%  8.73;  Surgeon: Lockie Mola, MD;  Location: [facility];  Service: Ophthalmology;  Laterality: Left;   IR RADIOLOGIST EVAL & MGMT  12/24/2021   IR RADIOLOGIST EVAL & MGMT  01/17/2022   TONSILLECTOMY AND ADENOIDECTOMY  1944    No Known Allergies  Outpatient Encounter Medications as of 03/21/2023  Medication Sig   acetaminophen (TYLENOL) 500 MG tablet Take Two tablets in the morning and Take Two tablets at bedtime.   Ascorbic Acid (VITAMIN C) 1000 MG tablet Take 1,000 mg by mouth daily.   atorvastatin (LIPITOR) 80 MG tablet Take 1 tablet (80 mg total) by mouth daily.   cholecalciferol (VITAMIN D) 1000 UNITS tablet Take 1,000 Units by mouth daily.   clopidogrel (PLAVIX) 75 MG tablet Take 1 tablet (75 mg total) by mouth daily.   levothyroxine (SYNTHROID) 112 MCG tablet Take 1 tablet (112 mcg  total) by mouth daily.   metoprolol succinate (TOPROL-XL) 25 MG 24 hr tablet Take 1 tablet (25 mg total) by mouth daily. Take with or immediately following a meal.   mirabegron ER (MYRBETRIQ) 50 MG TB24 tablet Take 1 tablet (50 mg total) by mouth daily.   Multiple Vitamins-Minerals (ICAPS AREDS 2 PO) Take by mouth.   Polyethylene Glycol 3350 (MIRALAX PO) Take by mouth.   risedronate (ACTONEL) 150 MG tablet Take 1 tablet (150 mg total) by mouth every 30 (thirty) days. with water on empty stomach, nothing by mouth or lie down for next 30 minutes.   [DISCONTINUED] mirtazapine (REMERON) 45 MG tablet TAKE 1 TABLET AT BEDTIME (CHANGING BACK TO HIGHER DOSE)  (Patient taking differently: Taking 1/2 tablet by mouth at bedtime.)   No facility-administered encounter medications on file as of 03/21/2023.    Review of Systems:  Review of Systems  Health Maintenance  Topic Date Due   INFLUENZA VACCINE  01/02/2023   Medicare Annual Wellness (AWV)  01/30/2023   MAMMOGRAM  09/30/2029 (Originally 03/31/2020)   COVID-19 Vaccine (14 - 2023-24 season) 04/25/2023   DTaP/Tdap/Td (3 - Td or Tdap) 04/06/2032   Pneumonia Vaccine 42+ Years old  Completed   DEXA SCAN  Completed   Zoster Vaccines- Shingrix  Completed   HPV VACCINES  Aged Out    Physical Exam: Vitals:   03/21/23 1246  BP: 126/78  Pulse: 93  Temp: (!) 97.4 F (36.3 C)  SpO2: 97%  Weight: 133 lb (60.3 kg)  Height: 5\' 4"  (1.626 m)   Body mass index is 22.83 kg/m. Physical Exam Constitutional:      Appearance: Normal appearance.  Cardiovascular:     Rate and Rhythm: Normal rate and regular rhythm.     Pulses: Normal pulses.     Heart sounds: Normal heart sounds.  Pulmonary:     Effort: Pulmonary effort is normal.  Musculoskeletal:     Comments: Patient with 5/5 grip strength, 5/5 deltoid strength, 5/5 bicep and tricep strength. No pain with Neer's. Pain with lift off test.   Neurological:     Mental Status: She is alert and oriented to person, place, and time.     Labs reviewed: Basic Metabolic Panel: Recent Labs    10/21/22 0800 12/12/22 0736 01/27/23 0730  NA 138  --   --   K 4.4  --   --   CL 103  --   --   CO2 26  --   --   GLUCOSE 86  --   --   BUN 21  --   --   CREATININE 1.00*  --   --   CALCIUM 8.8  --   --   TSH 11.05* 5.80* 2.55   Liver Function Tests: Recent Labs    10/21/22 0800  AST 35  ALT 46*  BILITOT 0.4  PROT 6.7   No results for input(s): "LIPASE", "AMYLASE" in the last 8760 hours. No results for input(s): "AMMONIA" in the last 8760 hours. CBC: Recent Labs    10/21/22 0800  WBC 4.9  NEUTROABS 2,950  HGB 12.0  HCT 36.6  MCV 95.3   PLT 263   Lipid Panel: No results for input(s): "CHOL", "HDL", "LDLCALC", "TRIG", "CHOLHDL", "LDLDIRECT" in the last 8760 hours. Lab Results  Component Value Date   HGBA1C 5.9 (H) 07/27/2021    Procedures since last visit: No results found.  Assessment/Plan There are no diagnoses linked to this encounter.   Labs/tests ordered:  *  No order type specified * Next appt:  04/23/2023

## 2023-03-24 DIAGNOSIS — M25511 Pain in right shoulder: Secondary | ICD-10-CM | POA: Diagnosis not present

## 2023-03-26 DIAGNOSIS — M25511 Pain in right shoulder: Secondary | ICD-10-CM | POA: Diagnosis not present

## 2023-03-27 DIAGNOSIS — Z23 Encounter for immunization: Secondary | ICD-10-CM | POA: Diagnosis not present

## 2023-03-31 DIAGNOSIS — M25511 Pain in right shoulder: Secondary | ICD-10-CM | POA: Diagnosis not present

## 2023-04-02 DIAGNOSIS — M25511 Pain in right shoulder: Secondary | ICD-10-CM | POA: Diagnosis not present

## 2023-04-08 DIAGNOSIS — M25511 Pain in right shoulder: Secondary | ICD-10-CM | POA: Diagnosis not present

## 2023-04-15 DIAGNOSIS — M25511 Pain in right shoulder: Secondary | ICD-10-CM | POA: Diagnosis not present

## 2023-04-16 ENCOUNTER — Ambulatory Visit: Payer: Medicare Other | Admitting: Student

## 2023-04-21 ENCOUNTER — Ambulatory Visit: Payer: Medicare Other | Admitting: Student

## 2023-04-22 DIAGNOSIS — M25511 Pain in right shoulder: Secondary | ICD-10-CM | POA: Diagnosis not present

## 2023-04-23 ENCOUNTER — Encounter: Payer: Self-pay | Admitting: Student

## 2023-04-23 ENCOUNTER — Ambulatory Visit: Payer: Medicare Other | Admitting: Student

## 2023-04-23 VITALS — BP 122/76 | HR 69 | Temp 98.2°F | Ht 64.0 in | Wt 134.0 lb

## 2023-04-23 DIAGNOSIS — M8000XA Age-related osteoporosis with current pathological fracture, unspecified site, initial encounter for fracture: Secondary | ICD-10-CM

## 2023-04-23 DIAGNOSIS — G4700 Insomnia, unspecified: Secondary | ICD-10-CM

## 2023-04-23 DIAGNOSIS — E039 Hypothyroidism, unspecified: Secondary | ICD-10-CM

## 2023-04-23 DIAGNOSIS — I700 Atherosclerosis of aorta: Secondary | ICD-10-CM

## 2023-04-23 DIAGNOSIS — E559 Vitamin D deficiency, unspecified: Secondary | ICD-10-CM

## 2023-04-23 DIAGNOSIS — R7303 Prediabetes: Secondary | ICD-10-CM

## 2023-04-23 MED ORDER — TRAZODONE HCL 50 MG PO TABS: 50.0000 mg | ORAL_TABLET | Freq: Every day | ORAL | 3 refills | Status: DC

## 2023-04-23 NOTE — Progress Notes (Signed)
Location:   [facility]   Place of Service:   [facility]  Provider: Sydnee Cabal  Code Status: DNR Goals of Care:     04/23/2023   11:25 AM  Advanced Directives  Does Patient Have a Medical Advance Directive? Yes  Type of Estate agent of [place];Living will;Out of facility DNR (pink MOST or yellow form)  Does patient want to make changes to medical advance directive? No - Patient declined  Copy of Healthcare Power of Attorney in Chart? Yes - validated most recent copy scanned in chart (See row information)  Pre-existing out of facility DNR order (yellow form or pink MOST form) Yellow form placed in chart (order not valid for inpatient use)     Chief Complaint  Patient presents with   Follow-up    1 month follow-up. Discuss need for awv, flu vaccine and covid booster. Patient with trouble staying asleep. Discuss PT for right arm.     HPI: Patient is a 87 y.o. female seen today for medical management of chronic diseases.    She goes to sleep but she doesn't stay asleep. Around 12AM she wakes up and can't get back to sleep. She also has issue with overactive bladder.   Her physical therapy is almost finished for her shoulder. She has 4 more sessions and is going once weekly. She is back in Yoga doing what she can. She goes to gym and walks or does bicycle before class.   Past Medical History:  Diagnosis Date   Arthritis    Complication of anesthesia    Depression    Hyperlipidemia    Osteopenia    Osteopenia    Sleep disorder    Thyroid disease     Past Surgical History:  Procedure Laterality Date   ABDOMINAL HYSTERECTOMY     APPENDECTOMY     CATARACT EXTRACTION W/PHACO Right 03/03/2019   Procedure: CATARACT EXTRACTION PHACO AND INTRAOCULAR LENS PLACEMENT (IOC) RIGHT  00:44.0  16.1%  7.12;  Surgeon: Lockie Mola, MD;  Location: [facility];  Service: Ophthalmology;  Laterality: Right;   CATARACT EXTRACTION W/PHACO Left 03/24/2019    Procedure: CATARACT EXTRACTION PHACO AND INTRAOCULAR LENS PLACEMENT (IOC) LEFT  01:09.4  12.5%  8.73;  Surgeon: Lockie Mola, MD;  Location: [facility];  Service: Ophthalmology;  Laterality: Left;   IR RADIOLOGIST EVAL & MGMT  12/24/2021   IR RADIOLOGIST EVAL & MGMT  01/17/2022   TONSILLECTOMY AND ADENOIDECTOMY  1944    No Known Allergies  Outpatient Encounter Medications as of 04/23/2023  Medication Sig   acetaminophen (TYLENOL) 500 MG tablet Take Two tablets in the morning and Take Two tablets at bedtime.   Ascorbic Acid (VITAMIN C) 1000 MG tablet Take 1,000 mg by mouth daily.   atorvastatin (LIPITOR) 80 MG tablet Take 1 tablet (80 mg total) by mouth daily.   cholecalciferol (VITAMIN D) 1000 UNITS tablet Take 1,000 Units by mouth daily.   clopidogrel (PLAVIX) 75 MG tablet Take 1 tablet (75 mg total) by mouth daily.   levothyroxine (SYNTHROID) 112 MCG tablet Take 1 tablet (112 mcg total) by mouth daily.   Melatonin 10 MG TABS Take 1-2 tablets by mouth at bedtime.   metoprolol succinate (TOPROL-XL) 25 MG 24 hr tablet Take 1 tablet (25 mg total) by mouth daily. Take with or immediately following a meal.   mirabegron ER (MYRBETRIQ) 50 MG TB24 tablet Take 1 tablet (50 mg total) by mouth daily.   Multiple Vitamins-Minerals (  ICAPS AREDS 2 PO) Take by mouth.   Polyethylene Glycol 3350 (MIRALAX PO) Take by mouth.   risedronate (ACTONEL) 150 MG tablet Take 1 tablet (150 mg total) by mouth every 30 (thirty) days. with water on empty stomach, nothing by mouth or lie down for next 30 minutes.   No facility-administered encounter medications on file as of 04/23/2023.    Review of Systems:  Review of Systems  Health Maintenance  Topic Date Due   Medicare Annual Wellness (AWV)  01/30/2023   COVID-19 Vaccine [phone] - 2023-24 season) 04/25/2023   MAMMOGRAM  09/30/2029 (Originally 03/31/2020)   DTaP/Tdap/Td (3 - Td or Tdap) 04/06/2032   Pneumonia Vaccine 55+ Years old  Completed    INFLUENZA VACCINE  Completed   DEXA SCAN  Completed   Zoster Vaccines- Shingrix  Completed   HPV VACCINES  Aged Out    Physical Exam: Vitals:   04/23/23 1124  BP: 122/76  Pulse: 69  Temp: 98.2 F (36.8 C)  TempSrc: Temporal  SpO2: 99%  Weight: 134 lb (60.8 kg)  Height: 5\' 4"  (1.626 m)   Body mass index is 23 kg/m. Physical Exam Constitutional:      Appearance: Normal appearance.  Pulmonary:     Effort: Pulmonary effort is normal.  Abdominal:     General: Abdomen is flat.     Palpations: Abdomen is soft.  Neurological:     Mental Status: She is alert and oriented to person, place, and time.     Labs reviewed: Basic Metabolic Panel: Recent Labs    10/21/22 0800 12/12/22 0736 01/27/23 0730  NA 138  --   --   K 4.4  --   --   CL 103  --   --   CO2 26  --   --   GLUCOSE 86  --   --   BUN 21  --   --   CREATININE 1.00*  --   --   CALCIUM 8.8  --   --   TSH 11.05* 5.80* 2.55   Liver Function Tests: Recent Labs    10/21/22 0800  AST 35  ALT 46*  BILITOT 0.4  PROT 6.7   No results for input(s): "LIPASE", "AMYLASE" in the last 8760 hours. No results for input(s): "AMMONIA" in the last 8760 hours. CBC: Recent Labs    10/21/22 0800  WBC 4.9  NEUTROABS 2,950  HGB 12.0  HCT 36.6  MCV 95.3  PLT 263   Lipid Panel: No results for input(s): "CHOL", "HDL", "LDLCALC", "TRIG", "CHOLHDL", "LDLDIRECT" in the last 8760 hours. Lab Results  Component Value Date   HGBA1C 5.9 (H) 07/27/2021    Procedures since last visit: No results found.  Assessment/Plan Insomnia, unspecified type - Plan: traZODone (DESYREL) 50 MG tablet  Prediabetes - Plan: Hemoglobin A1c  Age-related osteoporosis with current pathological fracture, initial encounter - Plan: VITAMIN D 25 Hydroxy (Vit-D Deficiency, Fractures)  Hypothyroidism, unspecified type - Plan: TSH  Aortic atherosclerosis (HCC) - Plan: Complete Metabolic Panel with eGFR, CBC With Differential/Platelet, Lipid  Panel Patient with persistant insomnia. Plan to start trazodone. Continue melatonin. Discussed Ramelteon and doxepin in the future if no improvement with trazodone. F/u in 6 wks for labs and discuss sleep.   Trazodone - ramelton - doxepin (weight gain associated).  Labs/tests ordered:  * No order type specified * Next appt:  Visit date not found

## 2023-04-23 NOTE — Patient Instructions (Addendum)
Please take one tablet nightly. If you are not seeing improvement with one tablet, take 2 nightly.   Please come to the clinic at 7:30AM Monday before your appointment in January.

## 2023-04-29 DIAGNOSIS — M25511 Pain in right shoulder: Secondary | ICD-10-CM | POA: Diagnosis not present

## 2023-05-05 DIAGNOSIS — M25511 Pain in right shoulder: Secondary | ICD-10-CM | POA: Diagnosis not present

## 2023-05-13 ENCOUNTER — Other Ambulatory Visit: Payer: Self-pay

## 2023-05-13 DIAGNOSIS — M25511 Pain in right shoulder: Secondary | ICD-10-CM | POA: Diagnosis not present

## 2023-05-13 MED ORDER — MIRABEGRON ER 50 MG PO TB24: 50.0000 mg | ORAL_TABLET | Freq: Every day | ORAL | 3 refills | Status: DC

## 2023-05-20 DIAGNOSIS — H26492 Other secondary cataract, left eye: Secondary | ICD-10-CM | POA: Diagnosis not present

## 2023-05-20 DIAGNOSIS — H43813 Vitreous degeneration, bilateral: Secondary | ICD-10-CM | POA: Diagnosis not present

## 2023-05-20 DIAGNOSIS — H40003 Preglaucoma, unspecified, bilateral: Secondary | ICD-10-CM | POA: Diagnosis not present

## 2023-05-20 DIAGNOSIS — Z961 Presence of intraocular lens: Secondary | ICD-10-CM | POA: Diagnosis not present

## 2023-05-26 ENCOUNTER — Other Ambulatory Visit: Payer: Self-pay | Admitting: Student

## 2023-05-26 DIAGNOSIS — I6523 Occlusion and stenosis of bilateral carotid arteries: Secondary | ICD-10-CM

## 2023-06-09 DIAGNOSIS — R7303 Prediabetes: Secondary | ICD-10-CM | POA: Diagnosis not present

## 2023-06-09 DIAGNOSIS — M8000XA Age-related osteoporosis with current pathological fracture, unspecified site, initial encounter for fracture: Secondary | ICD-10-CM | POA: Diagnosis not present

## 2023-06-09 DIAGNOSIS — E039 Hypothyroidism, unspecified: Secondary | ICD-10-CM | POA: Diagnosis not present

## 2023-06-09 DIAGNOSIS — I700 Atherosclerosis of aorta: Secondary | ICD-10-CM | POA: Diagnosis not present

## 2023-06-10 LAB — COMPLETE METABOLIC PANEL WITH GFR
AG Ratio: 1.8 (calc) (ref 1.0–2.5)
ALT: 34 U/L — ABNORMAL HIGH (ref 6–29)
AST: 27 U/L (ref 10–35)
Albumin: 4.2 g/dL (ref 3.6–5.1)
Alkaline phosphatase (APISO): 77 U/L (ref 37–153)
BUN/Creatinine Ratio: 30 (calc) — ABNORMAL HIGH (ref 6–22)
BUN: 26 mg/dL — ABNORMAL HIGH (ref 7–25)
CO2: 28 mmol/L (ref 20–32)
Calcium: 9.2 mg/dL (ref 8.6–10.4)
Chloride: 104 mmol/L (ref 98–110)
Creat: 0.87 mg/dL (ref 0.60–0.95)
Globulin: 2.3 g/dL (ref 1.9–3.7)
Glucose, Bld: 88 mg/dL (ref 65–99)
Potassium: 4.2 mmol/L (ref 3.5–5.3)
Sodium: 138 mmol/L (ref 135–146)
Total Bilirubin: 0.3 mg/dL (ref 0.2–1.2)
Total Protein: 6.5 g/dL (ref 6.1–8.1)
eGFR: 65 mL/min/{1.73_m2} (ref >=60)

## 2023-06-10 LAB — VITAMIN D 25 HYDROXY (VIT D DEFICIENCY, FRACTURES): Vit D, 25-Hydroxy: 26 ng/mL — ABNORMAL LOW (ref 30–100)

## 2023-06-10 LAB — CBC WITH DIFFERENTIAL/PLATELET
Absolute Lymphocytes: 1102 {cells}/uL (ref 850–3900)
Absolute Monocytes: 686 {cells}/uL (ref 200–950)
Basophils Absolute: 32 {cells}/uL (ref 0–200)
Basophils Relative: 0.6 %
Eosinophils Absolute: 297 {cells}/uL (ref 15–500)
Eosinophils Relative: 5.5 %
HCT: 36.1 % (ref 35.0–45.0)
Hemoglobin: 11.8 g/dL (ref 11.7–15.5)
MCH: 31.8 pg (ref 27.0–33.0)
MCHC: 32.7 g/dL (ref 32.0–36.0)
MCV: 97.3 fL (ref 80.0–100.0)
MPV: 10.5 fL (ref 7.5–12.5)
Monocytes Relative: 12.7 %
Neutro Abs: 3283 {cells}/uL (ref 1500–7800)
Neutrophils Relative %: 60.8 %
Platelets: 228 10*3/uL (ref 140–400)
RBC: 3.71 10*6/uL — ABNORMAL LOW (ref 3.80–5.10)
RDW: 11.8 % (ref 11.0–15.0)
Total Lymphocyte: 20.4 %
WBC: 5.4 10*3/uL (ref 3.8–10.8)

## 2023-06-10 LAB — TSH: TSH: 0.54 m[IU]/L (ref 0.40–4.50)

## 2023-06-10 LAB — LIPID PANEL
Cholesterol: 189 mg/dL (ref <200)
HDL: 91 mg/dL (ref >=50)
LDL Cholesterol (Calc): 80 mg/dL
Non-HDL Cholesterol (Calc): 98 mg/dL (ref <130)
Total CHOL/HDL Ratio: 2.1 (calc) (ref <5.0)
Triglycerides: 94 mg/dL (ref <150)

## 2023-06-10 LAB — HEMOGLOBIN A1C
Hgb A1c MFr Bld: 5.9 %{Hb} — ABNORMAL HIGH (ref <5.7)
Mean Plasma Glucose: 123 mg/dL
eAG (mmol/L): 6.8 mmol/L

## 2023-06-12 DIAGNOSIS — H26492 Other secondary cataract, left eye: Secondary | ICD-10-CM | POA: Diagnosis not present

## 2023-06-13 MED ORDER — VITAMIN D (ERGOCALCIFEROL) 1.25 MG (50000 UNIT) PO CAPS: 50000.0000 [IU] | ORAL_CAPSULE | ORAL | 0 refills | Status: DC

## 2023-07-02 ENCOUNTER — Encounter: Payer: Self-pay | Admitting: Student

## 2023-07-02 ENCOUNTER — Ambulatory Visit: Payer: Medicare Other | Admitting: Student

## 2023-07-02 VITALS — BP 128/76 | HR 79 | Temp 96.3°F | Ht 64.0 in | Wt 135.0 lb

## 2023-07-02 DIAGNOSIS — N1831 Chronic kidney disease, stage 3a: Secondary | ICD-10-CM | POA: Diagnosis not present

## 2023-07-02 DIAGNOSIS — F419 Anxiety disorder, unspecified: Secondary | ICD-10-CM | POA: Diagnosis not present

## 2023-07-02 DIAGNOSIS — G3184 Mild cognitive impairment, so stated: Secondary | ICD-10-CM

## 2023-07-02 DIAGNOSIS — E039 Hypothyroidism, unspecified: Secondary | ICD-10-CM

## 2023-07-02 NOTE — Patient Instructions (Signed)
VISIT SUMMARY:  During today's visit, we discussed your chronic shoulder pain, insomnia, and vitamin D deficiency. We also reviewed your recent lab results and your husband's vision and mobility issues. We have outlined a plan to help manage your symptoms and improve your overall health.  YOUR PLAN:  -CHRONIC SHOULDER PAIN: Chronic shoulder pain is ongoing pain in the shoulder that can limit your range of motion and affect daily activities. To manage this, continue your shoulder exercises and yoga sessions twice a week. Additionally, try post-exercise relaxation techniques such as applying ice or taking Epsom salt baths to reduce inflammation.  -INSOMNIA: Insomnia is difficulty falling or staying asleep. You are currently taking trazodone, which has been somewhat effective. We recommend continuing with trazodone and considering counseling to help manage your anxiety, which may be contributing to your sleep issues.  -VITAMIN D DEFICIENCY: Vitamin D deficiency means you have lower than normal levels of vitamin D, which is important for bone health. Continue taking the higher dose of vitamin D as prescribed to help improve your levels.  -GENERAL HEALTH MAINTENANCE: Your recent lab results show a slightly low vitamin D level and a slight increase in ALT, but your hemoglobin, A1c, and kidney function are normal. Continue taking Tylenol and Actonel as prescribed. We will monitor your liver function due to the slight increase in ALT and continue routine lab monitoring.  INSTRUCTIONS:  Please continue with your current medications and follow the outlined plan for managing your shoulder pain, insomnia, and vitamin D deficiency. Consider seeking counseling for anxiety management. We will monitor your liver function and continue routine lab tests. Follow up with [place] if you have any new or worsening symptoms.

## 2023-07-02 NOTE — Progress Notes (Signed)
[facility].   Provider: Dr. Earnestine Mealing  Code Status: DNR Goals of Care:     07/02/2023    1:26 PM  Advanced Directives  Does Patient Have a Medical Advance Directive? Yes  Type of Estate agent of [place];Out of facility DNR (pink MOST or yellow form)  Does patient want to make changes to medical advance directive? No - Patient declined  Copy of Healthcare Power of Attorney in Chart? Yes - validated most recent copy scanned in chart (See row information)     Chief Complaint  Patient presents with   Acute Visit    Right Shoulder Pain. Released from PT. To discuss Labwork.     HPI: Patient is a 88 y.o. female seen today for an acute visit for Right Shoulder Pain. Released from PT. To discuss Labwork.  Discussed the use of AI scribe software for clinical note transcription with the patient, who gave verbal consent to proceed.  History of Present Illness   The patient presents with chronic shoulder pain and concerns about her husband's vision issues.  She experiences chronic shoulder pain that affects her range of motion during activities such as dressing and doing her hair. Despite performing exercises and resuming yoga twice a week, she remains concerned about the permanence of her condition. The pain does not interfere with normal household activities. She takes Tylenol extra strength, two in the morning and two at night, and a higher dose of vitamin D due to low levels.  She uses trazodone for sleep, which she finds helpful, although she wakes up at 3 AM worrying about various issues. Recent lab results indicate a slightly low vitamin D level and a slight increase in ALT, while other results, including hemoglobin, A1c, and kidney function, are normal.  Her husband's vision issues include a transition from wet to dry macular degeneration. He receives injections every thirteen weeks. His eye sensitivity to light necessitates wearing sunglasses  indoors and causes tearing, although he can still perform computer work, read, and watch TV. She is concerned about his ability to drive due to a recent episode of impaired vision from tearing and light sensitivity.  Her husband also faces mobility challenges, using a motorized wheelchair and a walker. He exercises his legs at the gym two or three times a week. He had considered knee replacement surgery but decided against it due to the risk of blood clots and now feels it is too late at his age of 32.      Past Medical History:  Diagnosis Date   Arthritis    Complication of anesthesia    Depression    Hyperlipidemia    Osteopenia    Osteopenia    Sleep disorder    Thyroid disease     Past Surgical History:  Procedure Laterality Date   ABDOMINAL HYSTERECTOMY     APPENDECTOMY     CATARACT EXTRACTION W/PHACO Right 03/03/2019   Procedure: CATARACT EXTRACTION PHACO AND INTRAOCULAR LENS PLACEMENT (IOC) RIGHT  00:44.0  16.1%  7.12;  Surgeon: Lockie Mola, MD;  Location: [facility];  Service: Ophthalmology;  Laterality: Right;   CATARACT EXTRACTION W/PHACO Left 03/24/2019   Procedure: CATARACT EXTRACTION PHACO AND INTRAOCULAR LENS PLACEMENT (IOC) LEFT  01:09.4  12.5%  8.73;  Surgeon: Lockie Mola, MD;  Location: [facility];  Service: Ophthalmology;  Laterality: Left;   IR RADIOLOGIST EVAL & MGMT  12/24/2021   IR RADIOLOGIST EVAL & MGMT  01/17/2022   TONSILLECTOMY  AND ADENOIDECTOMY  1946    No Known Allergies  Outpatient Encounter Medications as of 07/02/2023  Medication Sig   acetaminophen (TYLENOL) 500 MG tablet Take Two tablets in the morning and Take Two tablets at bedtime.   Ascorbic Acid (VITAMIN C) 1000 MG tablet Take 1,000 mg by mouth daily.   atorvastatin (LIPITOR) 80 MG tablet Take 1 tablet (80 mg total) by mouth daily.   cholecalciferol (VITAMIN D) 1000 UNITS tablet Take 1,000 Units by mouth daily.   clopidogrel (PLAVIX) 75 MG tablet  TAKE 1 TABLET DAILY   levothyroxine (SYNTHROID) 112 MCG tablet Take 1 tablet (112 mcg total) by mouth daily.   Melatonin 10 MG TABS Take 1-2 tablets by mouth at bedtime.   metoprolol succinate (TOPROL-XL) 25 MG 24 hr tablet Take 1 tablet (25 mg total) by mouth daily. Take with or immediately following a meal.   mirabegron ER (MYRBETRIQ) 50 MG TB24 tablet Take 1 tablet (50 mg total) by mouth daily.   Multiple Vitamins-Minerals (ICAPS AREDS 2 PO) Take by mouth.   Polyethylene Glycol 3350 (MIRALAX PO) Take by mouth.   risedronate (ACTONEL) 150 MG tablet Take 1 tablet (150 mg total) by mouth every 30 (thirty) days. with water on empty stomach, nothing by mouth or lie down for next 30 minutes.   traZODone (DESYREL) 50 MG tablet Take 1 tablet (50 mg total) by mouth at bedtime.   Vitamin D, Ergocalciferol, (DRISDOL) 1.25 MG (50000 UNIT) CAPS capsule Take 1 capsule (50,000 Units total) by mouth every 7 (seven) days.   No facility-administered encounter medications on file as of 07/02/2023.    Review of Systems:  Review of Systems  Health Maintenance  Topic Date Due   Medicare Annual Wellness (AWV)  01/30/2023   COVID-19 Vaccine [phone] - 2024-25 season) 04/25/2023   MAMMOGRAM  09/30/2029 (Originally 03/31/2020)   DTaP/Tdap/Td (3 - Td or Tdap) 04/06/2032   Pneumonia Vaccine 48+ Years old  Completed   INFLUENZA VACCINE  Completed   DEXA SCAN  Completed   Zoster Vaccines- Shingrix  Completed   HPV VACCINES  Aged Out    Physical Exam: Vitals:   07/02/23 1324  BP: 128/76  Pulse: 79  Temp: (!) 96.3 F (35.7 C)  SpO2: 98%  Weight: 135 lb (61.2 kg)  Height: 5\' 4"  (1.626 m)   Body mass index is 23.17 kg/m. Physical Exam Physical Exam   CARDIOVASCULAR: Irregular heart rhythm noted on auscultation.      Labs reviewed: Basic Metabolic Panel: Recent Labs    10/21/22 0800 12/12/22 0736 01/27/23 0730 06/09/23 0804  NA 138  --   --  138  K 4.4  --   --  4.2  CL 103  --   --  104  CO2 26   --   --  28  GLUCOSE 86  --   --  88  BUN 21  --   --  26*  CREATININE 1.00*  --   --  0.87  CALCIUM 8.8  --   --  9.2  TSH 11.05* 5.80* 2.55 0.54   Liver Function Tests: Recent Labs    10/21/22 0800 06/09/23 0804  AST 35 27  ALT 46* 34*  BILITOT 0.4 0.3  PROT 6.7 6.5   No results for input(s): "LIPASE", "AMYLASE" in the last 8760 hours. No results for input(s): "AMMONIA" in the last 8760 hours. CBC: Recent Labs    10/21/22 0800 06/09/23 0804  WBC 4.9 5.4  NEUTROABS 2,950  3,283  HGB 12.0 11.8  HCT 36.6 36.1  MCV 95.3 97.3  PLT 263 228   Lipid Panel: Recent Labs    06/09/23 0804  CHOL 189  HDL 91  LDLCALC 80  TRIG 94  CHOLHDL 2.1   Lab Results  Component Value Date   HGBA1C 5.9 (H) 06/09/2023    Procedures since last visit: No results found. Results   LABS Hb: 11.8 g/dL N8G: 9.5% Cr: 6.21 mg/dL eGFR: 65 HY/QMV/7.84O Na: 138 mmol/L K: 4.2 mmol/L Ca: 9.2 mg/dL ALT: 34 U/L Vitamin D: 26 ng/mL       Assessment/Plan Assessment and Plan    Chronic Shoulder Pain Chronic shoulder pain with limited range of motion during specific activities such as dressing and hair care. Pain is exacerbated by certain movements but not present during normal activities. Emphasized rest to prevent tendonitis and post-exercise relaxation techniques to reduce inflammation. - Continue shoulder exercises - Attend yoga twice a week - Consider post-exercise relaxation techniques such as ice application or Epsom salt baths  Insomnia Reports waking up at 3 AM with anxiety. Currently taking trazodone, which has been somewhat effective. Discussed potential benefit of counseling for anxiety management. - Continue trazodone - Consider counseling for anxiety management  Vitamin D Deficiency Vitamin D level is 26. Currently taking a higher dose of vitamin D as prescribed. - Continue higher dose vitamin D supplementation  General Health Maintenance Routine lab results:  Hemoglobin 11.8, A1c 5.9, normal kidney function, normal electrolytes, slight increase in ALT. Takes Tylenol (two in the morning and two at night) and Actonel 150 mg monthly for bone density. - Continue Tylenol and Actonel - Monitor liver function due to slight increase in ALT - Continue routine lab monitoring.   Anxiety and adjustment Discussed possibility of medication or counseling, declined. Continue supportive.      Labs/tests ordered:  * No order type specified * Next appt:  Visit date not found

## 2023-07-08 ENCOUNTER — Telehealth: Payer: Self-pay

## 2023-07-08 NOTE — Telephone Encounter (Signed)
Patient want to know how you'd like her to take Vitamin D. Do you want her to stop taking her own supplement and take your prescription or take both her supplement with your prescription? Message sent to PCP Earnestine Mealing, MD .

## 2023-07-11 NOTE — Telephone Encounter (Signed)
 Patient called and notified.

## 2023-07-11 NOTE — Telephone Encounter (Signed)
 Robin Gehrig, MD      Patient is supposed to take prescribed weekly dosing until completed. Does not need her over the counter supplement during treatment dose.

## 2023-08-08 ENCOUNTER — Telehealth: Payer: Self-pay | Admitting: Cardiology

## 2023-08-08 ENCOUNTER — Other Ambulatory Visit: Payer: Self-pay | Admitting: Student

## 2023-08-08 DIAGNOSIS — E039 Hypothyroidism, unspecified: Secondary | ICD-10-CM

## 2023-08-08 MED ORDER — METOPROLOL SUCCINATE ER 25 MG PO TB24: 25.0000 mg | ORAL_TABLET | Freq: Every day | ORAL | 0 refills | Status: DC

## 2023-08-08 MED ORDER — LEVOTHYROXINE SODIUM 112 MCG PO TABS: 112.0000 ug | ORAL_TABLET | Freq: Every day | ORAL | 2 refills | Status: DC

## 2023-08-08 NOTE — Telephone Encounter (Signed)
 Copied from CRM [id number]. Topic: Clinical - Medication Refill >> Aug 08, 2023 11:03 AM Philippa Chester F wrote: Most Recent Primary Care Visit:  Provider: Tillman Abide I  Department: [facility]  Visit Type: PHYSICAL  Date: 01/29/2022  Patient stated that she received her prescription refill of the medication listed below and on the label it stated there are no refills for the prescription. Patient believes this was done in error and would like a new [facility] to [facility] the normal refill amount.   Medication: levothyroxine (SYNTHROID) 112 MCG tablet  Has the patient contacted their pharmacy? No  Is this the correct pharmacy for this prescription? Yes If no, delete pharmacy and type the correct one.  This is the patient's preferred pharmacy:  [facility] - [place], [place] - [address] [place] [address] Phone: [phone] Fax: [phone]  Has the prescription been filled recently? Yes  Is the patient out of the medication? No  Has the patient been seen for an appointment in the last year OR does the patient have an upcoming appointment? No  Can we respond through MyChart? No, Patient would like a phone call once the matter is resolved at [phone]   Agent: Please be advised that Rx refills may take up to 3 business days. We ask that you follow-up with your pharmacy.

## 2023-08-08 NOTE — Telephone Encounter (Signed)
*  STAT* If patient is at the pharmacy, call can be transferred to refill team.   1. Which medications need to be refilled? (please list name of each medication and dose if known)   metoprolol succinate (TOPROL-XL) 25 MG 24 hr tablet    4. Which pharmacy/location (including street and city if local pharmacy) is medication to be sent to?[facility] - [place], [place] - [address]    5. Do they need a 30 day or 90 day supply? 90

## 2023-08-24 ENCOUNTER — Encounter: Payer: Self-pay | Admitting: Student

## 2023-08-25 NOTE — Telephone Encounter (Signed)
Message routed to PCP Beamer, Victoria, MD  

## 2023-09-08 DIAGNOSIS — L814 Other melanin hyperpigmentation: Secondary | ICD-10-CM | POA: Diagnosis not present

## 2023-09-08 DIAGNOSIS — L821 Other seborrheic keratosis: Secondary | ICD-10-CM | POA: Diagnosis not present

## 2023-09-08 DIAGNOSIS — D1801 Hemangioma of skin and subcutaneous tissue: Secondary | ICD-10-CM | POA: Diagnosis not present

## 2023-09-12 DIAGNOSIS — Z23 Encounter for immunization: Secondary | ICD-10-CM | POA: Diagnosis not present

## 2023-10-01 ENCOUNTER — Other Ambulatory Visit: Payer: Self-pay | Admitting: *Deleted

## 2023-10-01 ENCOUNTER — Ambulatory Visit: Payer: Medicare Other | Admitting: Student

## 2023-10-01 ENCOUNTER — Encounter: Payer: Self-pay | Admitting: Student

## 2023-10-01 VITALS — BP 126/72 | HR 82 | Temp 97.4°F | Ht 64.0 in | Wt 137.0 lb

## 2023-10-01 DIAGNOSIS — N3281 Overactive bladder: Secondary | ICD-10-CM | POA: Diagnosis not present

## 2023-10-01 DIAGNOSIS — Z636 Dependent relative needing care at home: Secondary | ICD-10-CM | POA: Diagnosis not present

## 2023-10-01 DIAGNOSIS — G4700 Insomnia, unspecified: Secondary | ICD-10-CM

## 2023-10-01 MED ORDER — TRAZODONE HCL 50 MG PO TABS: 50.0000 mg | ORAL_TABLET | Freq: Every day | ORAL | 2 refills | Status: DC

## 2023-10-01 MED ORDER — GEMTESA 75 MG PO TABS: 75.0000 mg | ORAL_TABLET | Freq: Every day | ORAL | 11 refills | Status: DC

## 2023-10-01 MED ORDER — GEMTESA 75 MG PO TABS: 75.0000 mg | ORAL_TABLET | Freq: Every day | ORAL | 3 refills | Status: DC

## 2023-10-01 NOTE — Progress Notes (Signed)
 [facility]: [facility] Provider: Jann Melody  Code Status: DNR Goals of Care:     10/01/2023    1:52 PM  Advanced Directives  Does Patient Have a Medical Advance Directive? Yes  Type of Estate agent of [place];Living will;Out of facility DNR (pink MOST or yellow form)  Does patient want to make changes to medical advance directive? No - Patient declined  Copy of Healthcare Power of Attorney in Chart? Yes - validated most recent copy scanned in chart (See row information)     Chief Complaint  Patient presents with   Medical Management of Chronic Issues    Medical Management of  Chronic Issues. 3 Month follow up. Wants to discuss OAB    HPI: Patient is a 88 y.o. female seen today for medical management of chronic diseases.   Discussed the use of AI scribe software for clinical note transcription with the patient, who gave verbal consent to proceed.  History of Present Illness   Robin Logan is an 88 year old female who presents with nocturia and urinary urgency.  She experiences overactive bladder symptoms, primarily nocturia, which disrupts her sleep as she wakes up two to three times per night to urinate. She goes to bed around 10:30 PM and wakes up around 12:30 AM and again around 3:00 or 4:00 AM to use the bathroom. These symptoms have been present for a long time.  She has been taking mirabegron  for her overactive bladder, but feels that her symptoms may have worsened. While her daytime urinary frequency has improved, she still experiences urgency and frequent urination during the day, which sometimes interrupts her activities, such as grocery shopping or conversations.  She drinks one cup of coffee in the morning and primarily drinks water throughout the day. Occasionally, she drinks [place] at night. She has been on mirabegron  since she started seeing her current provider, but is concerned about the cost and potential side effects of  alternative medications like oxybutynin.  She mentions receiving advertisements for medications from [employer], where a family member worked and retired. She expresses concern about the side effects of older medications and prefers to avoid them.  In terms of social history, she is a caregiver for her husband, Austine Blunt, and manages household tasks with the help of home care services. She has housekeeping services every two weeks and is considering additional support for her husband's care, particularly with his morning routine and showers.         Past Medical History:  Diagnosis Date   Arthritis    Complication of anesthesia    Depression    Hyperlipidemia    Osteopenia    Osteopenia    Sleep disorder    Thyroid  disease     Past Surgical History:  Procedure Laterality Date   ABDOMINAL HYSTERECTOMY     APPENDECTOMY     CATARACT EXTRACTION W/PHACO Right 03/03/2019   Procedure: CATARACT EXTRACTION PHACO AND INTRAOCULAR LENS PLACEMENT (IOC) RIGHT  00:44.0  16.1%  7.12;  Surgeon: Annell Kidney, MD;  Location: [facility];  Service: Ophthalmology;  Laterality: Right;   CATARACT EXTRACTION W/PHACO Left 03/24/2019   Procedure: CATARACT EXTRACTION PHACO AND INTRAOCULAR LENS PLACEMENT (IOC) LEFT  01:09.4  12.5%  8.73;  Surgeon: Annell Kidney, MD;  Location: [facility];  Service: Ophthalmology;  Laterality: Left;   IR RADIOLOGIST EVAL & MGMT  12/24/2021   IR RADIOLOGIST EVAL & MGMT  01/17/2022   TONSILLECTOMY AND ADENOIDECTOMY  1944    No Known Allergies  Outpatient Encounter Medications as of 10/01/2023  Medication Sig   acetaminophen  (TYLENOL ) 500 MG tablet Take Two tablets in the morning and Take Two tablets at bedtime.   Ascorbic Acid (VITAMIN C) 1000 MG tablet Take 1,000 mg by mouth daily.   atorvastatin  (LIPITOR) 80 MG tablet Take 1 tablet (80 mg total) by mouth daily.   cholecalciferol (VITAMIN D ) 1000 UNITS tablet Take 1,000 Units by mouth  daily.   clopidogrel  (PLAVIX ) 75 MG tablet TAKE 1 TABLET DAILY   levothyroxine  (SYNTHROID ) 112 MCG tablet Take 1 tablet (112 mcg total) by mouth daily.   Melatonin 10 MG TABS Take 1-2 tablets by mouth at bedtime.   metoprolol  succinate (TOPROL -XL) 25 MG 24 hr tablet Take 1 tablet (25 mg total) by mouth daily. Take with or immediately following a meal. PLEASE CALL [phone] TO SCHEDULE YEARLY FOLLOW UP PRIOR TO NEXT REFILL REQUEST. THANK YOU.   Multiple Vitamins-Minerals (ICAPS AREDS 2 PO) Take by mouth.   Polyethylene Glycol 3350 (MIRALAX PO) Take by mouth.   risedronate  (ACTONEL ) 150 MG tablet Take 1 tablet (150 mg total) by mouth every 30 (thirty) days. with water on empty stomach, nothing by mouth or lie down for next 30 minutes.   Vitamin D , Ergocalciferol , (DRISDOL ) 1.25 MG (50000 UNIT) CAPS capsule Take 1 capsule (50,000 Units total) by mouth every 7 (seven) days.   [DISCONTINUED] mirabegron  ER (MYRBETRIQ ) 50 MG TB24 tablet Take 1 tablet (50 mg total) by mouth daily.   [DISCONTINUED] traZODone  (DESYREL ) 50 MG tablet Take 1 tablet (50 mg total) by mouth at bedtime.   [DISCONTINUED] Vibegron (GEMTESA) 75 MG TABS Take 1 tablet (75 mg total) by mouth daily.   Vibegron (GEMTESA) 75 MG TABS Take 1 tablet (75 mg total) by mouth daily.   No facility-administered encounter medications on file as of 10/01/2023.    Review of Systems:  Review of Systems  Health Maintenance  Topic Date Due   Medicare Annual Wellness (AWV)  01/30/2023   MAMMOGRAM  09/30/2029 (Originally 03/31/2020)   INFLUENZA VACCINE  01/02/2024   COVID-19 Vaccine (15 - 2024-25 season) 03/13/2024   DTaP/Tdap/Td (3 - Td or Tdap) 04/06/2032   Pneumonia Vaccine 26+ Years old  Completed   DEXA SCAN  Completed   Zoster Vaccines- Shingrix  Completed   HPV VACCINES  Aged Out   Meningococcal B Vaccine  Aged Out    Physical Exam: Vitals:   10/01/23 1349  BP: 126/72  Pulse: 82  Temp: (!) 97.4 F (36.3 C)  SpO2: 97%   Weight: 137 lb (62.1 kg)  Height: 5\' 4"  (1.626 m)   Body mass index is 23.52 kg/m. Physical Exam Constitutional:      Appearance: Normal appearance.  Cardiovascular:     Rate and Rhythm: Normal rate and regular rhythm.     Pulses: Normal pulses.     Heart sounds: Normal heart sounds.  Pulmonary:     Effort: Pulmonary effort is normal.  Abdominal:     General: Abdomen is flat. Bowel sounds are normal.     Palpations: Abdomen is soft.  Musculoskeletal:        General: No swelling or tenderness.  Skin:    General: Skin is warm and dry.     Capillary Refill: Capillary refill takes 2 to 3 seconds.  Neurological:     Mental Status: She is alert and oriented to person, place, and time.     Gait: Gait normal.  Psychiatric:        Mood and Affect: Mood normal.    Physical Exam          Labs reviewed: Basic Metabolic Panel: Recent Labs    10/21/22 0800 12/12/22 0736 01/27/23 0730 06/09/23 0804  NA 138  --   --  138  K 4.4  --   --  4.2  CL 103  --   --  104  CO2 26  --   --  28  GLUCOSE 86  --   --  88  BUN 21  --   --  26*  CREATININE 1.00*  --   --  0.87  CALCIUM  8.8  --   --  9.2  TSH 11.05* 5.80* 2.55 0.54   Liver Function Tests: Recent Labs    10/21/22 0800 06/09/23 0804  AST 35 27  ALT 46* 34*  BILITOT 0.4 0.3  PROT 6.7 6.5   No results for input(s): "LIPASE", "AMYLASE" in the last 8760 hours. No results for input(s): "AMMONIA" in the last 8760 hours. CBC: Recent Labs    10/21/22 0800 06/09/23 0804  WBC 4.9 5.4  NEUTROABS 2,950 3,283  HGB 12.0 11.8  HCT 36.6 36.1  MCV 95.3 97.3  PLT 263 228   Lipid Panel: Recent Labs    06/09/23 0804  CHOL 189  HDL 91  LDLCALC 80  TRIG 94  CHOLHDL 2.1   Lab Results  Component Value Date   HGBA1C 5.9 (H) 06/09/2023    Procedures since last visit: No results found. Results          Assessment/Plan     Overactive bladder Chronic overactive bladder with nocturia, requiring urination two  to three times per night, and frequent daytime urination. Current treatment with mirabegron  is inadequate, with no significant improvement in nocturia. Oxybutynin is contraindicated due to age-related side effects such as memory changes, confusion, constipation, dry mouth, and dry eyes. Gemtesa is considered as an alternative treatment with potentially fewer side effects and better efficacy. - Prescribe Gemtesa as an alternative to mirabegron . - Document failure of mirabegron  and contraindication of oxybutynin to facilitate insurance coverage for [place]. - Discussed other alternatives such as solifenacin and tolterodine, but they have more side effects.  Goals of Care Discussion about the need for home health aide services to assist with daily activities and care for her husband. Both she and her husband are open to receiving help, although there is some miscommunication about each other's willingness. Plans are in place for emergency situations, including a packet on the refrigerator for quick access to emergency contacts and instructions. - Contact [facility] for home health aide services to assist with morning routines and showers for her husband. - Ensure emergency plans are accessible and understood by both parties.       Labs/tests ordered:  * No order type specified * Next appt:  10/01/2023 I spent greater than 30 minutes for the care of this patient in face to face time, chart review, clinical documentation, patient education.

## 2023-10-01 NOTE — Patient Instructions (Signed)
 VISIT SUMMARY:  Today, we discussed your ongoing issues with overactive bladder, particularly the frequent need to urinate at night and during the day. We also talked about your role as a caregiver for your husband and the need for additional home health aide services.  YOUR PLAN:  -OVERACTIVE BLADDER: Overactive bladder is a condition where the bladder muscles contract too often, causing frequent and urgent urination. We will switch your medication from mirabegron  to Gemtesa, as it may be more effective and have fewer side effects. We have documented the failure of mirabegron  and the contraindication of oxybutynin to help with insurance coverage for [place]. Other medications like solifenacin and tolterodine were discussed but have more side effects.  -GOALS OF CARE: We discussed the need for additional home health aide services to help with your husband's morning routines and showers. You and your husband are open to receiving help, and we will contact [facility] for these services. Make sure that emergency plans are accessible and understood by both of you.  INSTRUCTIONS:  We will switch your medication to Gemtesa. Please contact [facility] for home health aide services to assist with your husband's morning routines and showers. Ensure that your emergency plans are accessible and understood by both you and your husband.

## 2023-10-01 NOTE — Telephone Encounter (Signed)
 Patient walked into Clinic requesting refill. To be sent to [facility].   Ok'ed per Dr. Jann Melody.

## 2023-10-03 ENCOUNTER — Telehealth: Payer: Self-pay

## 2023-10-03 NOTE — Telephone Encounter (Signed)
 [facility] call this afternoon in regards to asked Robin Gehrig, MD to verified that she has received paper work for medication request in regard for the alternative medication for Vibergron (Gemtesa ).  I spoke with the pharmacy representative to let them know that the paperwork was faxed over to our office but it is in [place], [place], [place] mailbox waiting to be signed.  Representative stated if Robin Gehrig, MD had any question the call back number is [phone]. The reference number is : [id number]   Message sent to Robin Gehrig, MD

## 2023-10-06 ENCOUNTER — Telehealth: Payer: Self-pay | Admitting: *Deleted

## 2023-10-06 NOTE — Telephone Encounter (Signed)
 Copied from CRM [id number]. Topic: Clinical - Prescription Issue >> Denese Mentink 5, 2025  1:52 PM Shelby Dessert H wrote: Reason for CRM: Patient rep from [facility] (Amy) called to check the status of the rx Gemtesa , she is trying to see if provider has sign off on it, and her callback number is [phone] ref number [id number].   Called [facility] and spoke with Chesley Cost and he released Rx.

## 2023-10-16 NOTE — Telephone Encounter (Signed)
 Paperwork signed and placed in fax "outbox."

## 2023-11-12 ENCOUNTER — Ambulatory Visit: Admitting: Student

## 2023-11-12 ENCOUNTER — Encounter: Payer: Self-pay | Admitting: Student

## 2023-11-12 VITALS — BP 126/76 | HR 73 | Temp 97.3°F | Ht 64.0 in | Wt 138.0 lb

## 2023-11-12 DIAGNOSIS — Z7189 Other specified counseling: Secondary | ICD-10-CM

## 2023-11-12 DIAGNOSIS — N3281 Overactive bladder: Secondary | ICD-10-CM

## 2023-11-12 DIAGNOSIS — Z636 Dependent relative needing care at home: Secondary | ICD-10-CM

## 2023-11-12 DIAGNOSIS — Z7689 Persons encountering health services in other specified circumstances: Secondary | ICD-10-CM

## 2023-11-12 NOTE — Patient Instructions (Addendum)
 VISIT SUMMARY:  Today, we discussed your overactive bladder, weight management, and caregiver stress. You reported improvement in your overactive bladder symptoms, which has positively impacted your sleep. We also addressed your concerns about a slight weight gain and the stress related to your husband's declining health and mobility issues.  YOUR PLAN:  -OVERACTIVE BLADDER: Overactive bladder is a condition where you feel a sudden urge to urinate that can be difficult to control. Your symptoms have improved with the medication Gemtesa , which has also helped you sleep better. Continue taking Gemtesa  as prescribed.  -CAREGIVER STRESS: Caregiver stress occurs when the demands of caring for a loved one become overwhelming. Your husband's declining health and mobility issues are significant stressors for you. Continue attending yoga classes for stress management, utilize support from friends and your CPA, and consider joining support groups for caregivers.  -GOALS OF CARE: We discussed your husband's declining health and the potential need for him to transition to a healthcare facility. Monitor his functional status closely, discuss the possibility of transitioning to a healthcare facility before a major incident occurs, and ensure that home safety measures are in place to prevent falls.  -WEIGHT MANAGEMENT: Maintaining a healthy weight is important for overall health. You have experienced a slight weight gain and are concerned about it. Focus on a diet rich in protein and fiber, avoid sugary drinks and high-calorie foods, and continue addressing stress through yoga and support from friends.  INSTRUCTIONS:  Please continue taking Gemtesa  for your overactive bladder. Keep attending yoga classes and utilize support from friends and your CPA to manage caregiver stress. Monitor your husband's functional status and discuss the potential transition to a healthcare facility before any major incidents. Focus  on maintaining a healthy diet and managing stress to help with weight management. Follow up with us  if you have any concerns or need further assistance.High-Fiber Eating Plan Fiber, also called dietary fiber, is found in foods such as fruits, vegetables, whole grains, and beans. A high-fiber diet can be good for your health. Your health care provider may recommend a high-fiber diet to help: Prevent trouble pooping (constipation). Lower your cholesterol. Treat the following conditions: Hemorrhoids. This is inflammation of veins in the anus. Inflammation of specific areas of the digestive tract. Irritable bowel syndrome (IBS). This is a problem of the large intestine, also called the colon, that sometimes causes belly pain and bloating. Prevent overeating as part of a weight-loss plan. Lower the risk of heart disease, type 2 diabetes, and certain cancers. What are tips for following this plan? Reading food labels  Check the nutrition facts label on foods for the amount of dietary fiber. Choose foods that have 4 grams of fiber or more per serving. The recommended goals for how much fiber you should eat each day include: Males 78 years old or younger: 30-34 g. Males over 65 years old: 28-34 g. Females 62 years old or younger: 25-28 g. Females over 2 years old: 22-25 g. Your daily fiber goal is 30 g. Shopping Choose whole fruits and vegetables instead of processed. For example, choose apples instead of apple juice or applesauce. Choose a variety of high-fiber foods such as avocados, lentils, oats, and pinto beans. Read the nutrition facts label on foods. Check for foods with added fiber. These foods often have high sugar and salt (sodium) amounts per serving. Cooking Use whole-grain flour for baking and cooking. Cook with brown rice instead of white rice. Make meals that have a lot of beans and  vegetables in them, such as chili or vegetable-based soups. Meal planning Start the day with a  breakfast that is high in fiber, such as a cereal that has 5 g of fiber or more per serving. Eat breads and cereals that are made with whole-grain flour instead of refined flour or white flour. Eat brown rice, bulgur wheat, or millet instead of white rice. Use beans in place of meat in soups, salads, and pasta dishes. Be sure that half of the grains you eat each day are whole grains. General information You can get the recommended amount of dietary fiber by: Eating a variety of fruits, vegetables, grains, nuts, and beans. Taking a fiber supplement if you aren't able to eat enough fiber. It's better to get fiber through food than from a supplement. Slowly increase how much fiber you eat. If you increase the amount of fiber you eat too quickly, you may have bloating, cramping, or gas. Drink plenty of water to help you digest fiber. Choose high-fiber snacks, such as berries, raw vegetables, nuts, and popcorn. What foods should I eat? Fruits Berries. Pears. Oranges. Avocado. Prunes and raisins. Dried figs. Vegetables Sweet potatoes. Spinach. Kale. Artichokes. Cabbage. Broccoli. Cauliflower. Green peas. Carrots. Squash. Grains Barley. Bulgur wheat. Millet. Quinoa. Bran muffins. Popcorn. Rye wafer crackers. Meats and other proteins Navy beans, kidney beans, and pinto beans. Soybeans. Split peas. Lentils. Nuts and seeds. Dairy Fiber-fortified yogurt. Fortified means that fiber has been added to the product. Beverages Fiber-fortified soy milk. Fiber-fortified orange juice. Other foods Fiber bars. The items listed above may not be all the foods and drinks you can have. Talk to a dietitian to learn more. What foods should I avoid? Fruits Fruit juice. Cooked, strained fruit. Vegetables Fried potatoes. Canned vegetables. Well-cooked vegetables. Grains White bread. Pasta made with refined flour. White rice. Meats and other proteins Fatty meat. Fried chicken or fried fish. Dairy Milk. Cream  cheese. Sour cream. Fats and oils Butters. Beverages Soft drinks. Other foods Cakes and pastries. The items listed above may not be all the foods and drinks you should avoid. Talk to a dietitian to learn more. This information is not intended to replace advice given to you by your health care provider. Make sure you discuss any questions you have with your health care provider. Document Revised: 08/12/2022 Document Reviewed: 08/12/2022 Elsevier Patient Education  2024 [employer].

## 2023-11-12 NOTE — Progress Notes (Signed)
 [facility]: [facility] Provider: Jann Melody  Code Status: DNR Goals of Care:     10/01/2023    1:52 PM  Advanced Directives  Does Patient Have a Medical Advance Directive? Yes  Type of Estate agent of [place];Living will;Out of facility DNR (pink MOST or yellow form)  Does patient want to make changes to medical advance directive? No - Patient declined  Copy of Healthcare Power of Attorney in Chart? Yes - validated most recent copy scanned in chart (See row information)     Chief Complaint  Patient presents with   Medical Management of Chronic Issues    Medical Management of Chronic Issues. 6 Week follow up    HPI: Patient is a 88 y.o. female seen today for medical management of chronic diseases.   Discussed the use of AI scribe software for clinical note transcription with the patient, who gave verbal consent to proceed.  History of Present Illness   Robin Logan is an 88 year old female who presents for follow-up of overactive bladder and weight management concerns.  She has experienced improvement in her overactive bladder symptoms, which has allowed for better sleep without frequent nocturnal awakenings. Initially skeptical about the treatment's effectiveness, she now finds it beneficial.  She is concerned about a three-pound weight gain since January, when she weighed 135 pounds. She is actively trying to maintain her weight by focusing on a diet that includes eggs, plain unsweetened [place] yogurt with blueberries, and fiber-rich foods like Fiber One cereal. She avoids granola and limits her intake of wheat crackers and oyster crackers.  She experiences anxiety, particularly related to her husband's declining health and mobility issues. She attends yoga three times a week to manage stress. Her husband uses a power chair due to difficulty walking, and she worries about his safety, especially during transfers and at night when he uses a  walker. She is concerned about the possibility of him needing assisted living in the future.  She has support from friends and a CPA for financial matters, as her husband has traditionally managed these tasks. She is learning to handle more responsibilities on the computer.  No issues with constipation.         Past Medical History:  Diagnosis Date   Arthritis    Complication of anesthesia    Depression    Hyperlipidemia    Osteopenia    Osteopenia    Sleep disorder    Thyroid  disease     Past Surgical History:  Procedure Laterality Date   ABDOMINAL HYSTERECTOMY     APPENDECTOMY     CATARACT EXTRACTION W/PHACO Right 03/03/2019   Procedure: CATARACT EXTRACTION PHACO AND INTRAOCULAR LENS PLACEMENT (IOC) RIGHT  00:44.0  16.1%  7.12;  Surgeon: Annell Kidney, MD;  Location: [facility];  Service: Ophthalmology;  Laterality: Right;   CATARACT EXTRACTION W/PHACO Left 03/24/2019   Procedure: CATARACT EXTRACTION PHACO AND INTRAOCULAR LENS PLACEMENT (IOC) LEFT  01:09.4  12.5%  8.73;  Surgeon: Annell Kidney, MD;  Location: [facility];  Service: Ophthalmology;  Laterality: Left;   IR RADIOLOGIST EVAL & MGMT  12/24/2021   IR RADIOLOGIST EVAL & MGMT  01/17/2022   TONSILLECTOMY AND ADENOIDECTOMY  1944    No Known Allergies  Outpatient Encounter Medications as of 11/12/2023  Medication Sig   acetaminophen  (TYLENOL ) 500 MG tablet Take Two tablets in the morning and Take Two tablets at bedtime.   Ascorbic Acid (VITAMIN C)  1000 MG tablet Take 1,000 mg by mouth daily.   atorvastatin  (LIPITOR) 80 MG tablet Take 1 tablet (80 mg total) by mouth daily.   cholecalciferol (VITAMIN D ) 1000 UNITS tablet Take 1,000 Units by mouth daily.   clopidogrel  (PLAVIX ) 75 MG tablet TAKE 1 TABLET DAILY   levothyroxine  (SYNTHROID ) 112 MCG tablet Take 1 tablet (112 mcg total) by mouth daily.   Melatonin 10 MG TABS Take 1-2 tablets by mouth at bedtime.   metoprolol  succinate (TOPROL -XL)  25 MG 24 hr tablet Take 1 tablet (25 mg total) by mouth daily. Take with or immediately following a meal. PLEASE CALL [phone] TO SCHEDULE YEARLY FOLLOW UP PRIOR TO NEXT REFILL REQUEST. THANK YOU.   Multiple Vitamins-Minerals (ICAPS AREDS 2 PO) Take by mouth.   Polyethylene Glycol 3350 (MIRALAX PO) Take by mouth.   risedronate  (ACTONEL ) 150 MG tablet Take 1 tablet (150 mg total) by mouth every 30 (thirty) days. with water on empty stomach, nothing by mouth or lie down for next 30 minutes.   traZODone  (DESYREL ) 50 MG tablet Take 1 tablet (50 mg total) by mouth at bedtime.   Vibegron  (GEMTESA ) 75 MG TABS Take 1 tablet (75 mg total) by mouth daily.   Vitamin D , Ergocalciferol , (DRISDOL ) 1.25 MG (50000 UNIT) CAPS capsule Take 1 capsule (50,000 Units total) by mouth every 7 (seven) days.   No facility-administered encounter medications on file as of 11/12/2023.    Review of Systems:  Review of Systems  Health Maintenance  Topic Date Due   Medicare Annual Wellness (AWV)  01/30/2023   MAMMOGRAM  09/30/2029 (Originally 03/31/2020)   INFLUENZA VACCINE  01/02/2024   COVID-19 Vaccine (15 - 2024-25 season) 03/13/2024   DTaP/Tdap/Td (3 - Td or Tdap) 04/06/2032   Pneumonia Vaccine 70+ Years old  Completed   DEXA SCAN  Completed   Zoster Vaccines- Shingrix  Completed   HPV VACCINES  Aged Out   Meningococcal B Vaccine  Aged Out    Physical Exam: Vitals:   11/12/23 1451  BP: 126/76  Pulse: 73  Temp: (!) 97.3 F (36.3 C)  SpO2: 97%  Weight: 138 lb (62.6 kg)  Height: 5' 4 (1.626 m)   Body mass index is 23.69 kg/m. Physical Exam Constitutional:      Appearance: Normal appearance.  Cardiovascular:     Rate and Rhythm: Normal rate and regular rhythm.     Pulses: Normal pulses.     Heart sounds: Normal heart sounds.  Pulmonary:     Effort: Pulmonary effort is normal.  Abdominal:     General: Abdomen is flat. Bowel sounds are normal.     Palpations: Abdomen is soft.  Musculoskeletal:         General: No swelling or tenderness.  Skin:    General: Skin is warm and dry.  Neurological:     Mental Status: She is alert.     Gait: Gait normal.  Psychiatric:        Mood and Affect: Mood normal.    Physical Exam          Labs reviewed: Basic Metabolic Panel: Recent Labs    12/12/22 0736 01/27/23 0730 06/09/23 0804  NA  --   --  138  K  --   --  4.2  CL  --   --  104  CO2  --   --  28  GLUCOSE  --   --  88  BUN  --   --  26*  CREATININE  --   --  0.87  CALCIUM   --   --  9.2  TSH 5.80* 2.55 0.54   Liver Function Tests: Recent Labs    06/09/23 0804  AST 27  ALT 34*  BILITOT 0.3  PROT 6.5   No results for input(s): LIPASE, AMYLASE in the last 8760 hours. No results for input(s): AMMONIA in the last 8760 hours. CBC: Recent Labs    06/09/23 0804  WBC 5.4  NEUTROABS 3,283  HGB 11.8  HCT 36.1  MCV 97.3  PLT 228   Lipid Panel: Recent Labs    06/09/23 0804  CHOL 189  HDL 91  LDLCALC 80  TRIG 94  CHOLHDL 2.1   Lab Results  Component Value Date   HGBA1C 5.9 (H) 06/09/2023    Procedures since last visit: No results found. Results          Assessment/Plan    Overactive bladder Symptoms have improved with Gemtesa , leading to better sleep and reduced nocturia. No constipation or significant weight gain, as these are not documented side effects of Gemtesa . - Continue Gemtesa  for overactive bladder  Caregiver stress Stress due to husband's declining health and potential need for assisted living. Concerns about managing household and financial responsibilities. Supported by friends and a CPA. Husband's functional decline is a significant stressor. - Continue yoga for stress management - Utilize support from friends and IT trainer - Consider support groups for caregivers  Goals of Care Discussion about husband's declining health and potential transition to a healthcare facility. Prefers to avoid assisted living for herself and believes  husband would benefit from a healthcare facility due to mobility issues. Concerned about husband's safety and potential for falls. - Monitor husband's functional status - Discuss potential transition to healthcare facility before a major incident - Ensure home safety measures are in place  Weight management Slight weight gain and tight clothing are concerns. Current weight is healthy, but she desires maintenance. No documented weight gain side effects from current medications. Diet includes protein and fiber-rich foods, with occasional sweets. Stress-related eating is a concern, particularly due to husband's health issues. - Focus on a diet rich in protein and fiber - Avoid sugary drinks and high-calorie foods - Address stress through yoga and support from friends          Labs/tests ordered:  * No order type specified * Next appt:  12/25/2023

## 2023-11-24 ENCOUNTER — Other Ambulatory Visit: Payer: Self-pay | Admitting: Student

## 2023-11-25 ENCOUNTER — Telehealth: Payer: Self-pay | Admitting: Cardiology

## 2023-11-25 NOTE — Telephone Encounter (Signed)
 Patient states that no appt is needed. When she needs one she will give us  a call.Please advise

## 2023-12-09 ENCOUNTER — Other Ambulatory Visit: Payer: Self-pay | Admitting: Student

## 2023-12-09 DIAGNOSIS — N3281 Overactive bladder: Secondary | ICD-10-CM

## 2023-12-09 NOTE — Telephone Encounter (Unsigned)
 Copied from CRM [id number]. Topic: Clinical - Prescription Issue >> Dec 09, 2023  5:32 PM Adrianna P wrote: Reason for CRM: [facility] for Vibegron  (GEMTESA ) 75 MG TABS. Can be reached at [phone] hours of operations [phone] est

## 2023-12-10 MED ORDER — GEMTESA 75 MG PO TABS: 75.0000 mg | ORAL_TABLET | Freq: Every day | ORAL | 3 refills | Status: DC

## 2023-12-18 ENCOUNTER — Telehealth: Payer: Self-pay | Admitting: *Deleted

## 2023-12-18 NOTE — Telephone Encounter (Signed)
 Gemtesa  75mg  daily is Currently in patient's medication list.   Please Advise.

## 2023-12-18 NOTE — Telephone Encounter (Signed)
 Copied from CRM [id number]. Topic: General - Other >> Dec 18, 2023  3:02 PM Robin Logan ORN wrote: Reason for CRM: patient use to  take the myretriq 50 mg for a long time  , is now on a different medication taking Gemtesa  75 mg , recieved a letter dated 12/08/23 , about a prescription plan change  Patient  spouse Robin Logan stated received a shipment about last  weeks ago   Need clarification on which medication should take  And please call back after 11 am tomorrow reason , patient and spouse has a coming up appointment ,    patient uses the [facility] - [name], [place] - [address] [place] [address] Phone: [phone] Fax: [phone] Hours: Not open 24 hours  patient call back # [phone]

## 2023-12-19 DIAGNOSIS — M8580 Other specified disorders of bone density and structure, unspecified site: Secondary | ICD-10-CM | POA: Diagnosis not present

## 2023-12-19 DIAGNOSIS — M5441 Lumbago with sciatica, right side: Secondary | ICD-10-CM | POA: Diagnosis not present

## 2023-12-19 DIAGNOSIS — M51361 Other intervertebral disc degeneration, lumbar region with lower extremity pain only: Secondary | ICD-10-CM | POA: Diagnosis not present

## 2023-12-22 NOTE — Telephone Encounter (Signed)
 Abdul Fine, MD to Me (Selected Message) VB    12/22/23  1:32 PM Hi Donzell,   I'm not sure what is meant here. It sounds like insurance is only covering one medication? But I haven't received any updates on my end. Please see if she can bring it by clinic to review.   Best,   VB      Called patient and she will bring letter by the [facility] Tomorrow for Dr. Abdul to review.

## 2023-12-23 NOTE — Telephone Encounter (Signed)
 Patient dropped off paperwork from [facility] stating that they received authorization from Doctor's office on 5/15 to change Gemtesa  to Myrebetriq 50mg .  Patient stated that she was never informed of Changed and that Gemtesa  was on her Current Medication list.   Patient is Confused as to what to take now.  Please Advise.    Renella Keen, [place]    10/03/23 12:16 PM Note [facility] call this afternoon in regards to asked Abdul Fine, MD to verified that she has received paper work for medication request in regard for the alternative medication for Vibergron (Gemtesa ).  I spoke with the pharmacy representative to let them know that the paperwork was faxed over to our office but it is in [place], [place], [place] mailbox waiting to be signed.  Representative stated if Abdul Fine, MD had any question the call back number is [phone]. The reference number is : [id number]     Message sent to Abdul Fine, MD

## 2023-12-24 ENCOUNTER — Other Ambulatory Visit: Payer: Self-pay | Admitting: Student

## 2023-12-24 DIAGNOSIS — N3281 Overactive bladder: Secondary | ICD-10-CM

## 2023-12-24 MED ORDER — GEMTESA 75 MG PO TABS: 75.0000 mg | ORAL_TABLET | Freq: Every day | ORAL | 3 refills | Status: AC

## 2023-12-24 NOTE — Telephone Encounter (Signed)
 Reviewed paperwork and agree it is confusing. Based on our previous experience with myrbetriq  and her preference for gemtesa  results, I will resend the gemtesa . The insurance company does not like that there is a ~$120 difference in monthly price for the medication and would like to change you back to myrbetriq . I apologize for the confusion/frustration. I will resend the prescription. If she would like to return the UNOPENED myrbetriq , she can take it back to the pharmacy.

## 2023-12-25 ENCOUNTER — Encounter: Payer: Self-pay | Admitting: Nurse Practitioner

## 2023-12-25 ENCOUNTER — Ambulatory Visit: Admitting: Nurse Practitioner

## 2023-12-25 VITALS — BP 122/74 | HR 61 | Temp 97.1°F | Ht 64.0 in | Wt 135.2 lb

## 2023-12-25 DIAGNOSIS — Z0000 Encounter for general adult medical examination without abnormal findings: Secondary | ICD-10-CM | POA: Diagnosis not present

## 2023-12-25 DIAGNOSIS — E2839 Other primary ovarian failure: Secondary | ICD-10-CM

## 2023-12-25 DIAGNOSIS — M5416 Radiculopathy, lumbar region: Secondary | ICD-10-CM | POA: Diagnosis not present

## 2023-12-25 DIAGNOSIS — M48062 Spinal stenosis, lumbar region with neurogenic claudication: Secondary | ICD-10-CM | POA: Diagnosis not present

## 2023-12-25 NOTE — Patient Instructions (Signed)
  Ms. Caputo , Thank you for taking time to come for your Medicare Wellness Visit. I appreciate your ongoing commitment to your health goals. Please review the following plan we discussed and let me know if I can assist you in the future.    This is a list of the screening recommended for you and due dates:  Health Maintenance  Topic Date Due   Hepatitis B Vaccine (2 of 3 - 19+ 3-dose series) 09/13/2003   Flu Shot  01/02/2024   COVID-19 Vaccine (15 - 2024-25 season) 03/13/2024   Medicare Annual Wellness Visit  12/24/2024   DTaP/Tdap/Td vaccine (3 - Td or Tdap) 04/06/2032   Pneumococcal Vaccine for age over 83  Completed   DEXA scan (bone density measurement)  Completed   Zoster (Shingles) Vaccine  Completed   HPV Vaccine  Aged Out   Meningitis B Vaccine  Aged Out

## 2023-12-25 NOTE — Progress Notes (Signed)
 Subjective:   Robin Logan is a 88 y.o. female who presents for Medicare Annual (Subsequent) preventive examination.  Visit Complete: In person TL clinic  Cardiac Risk Factors include: advanced age (>59men, >55 women);dyslipidemia;hypertension;family history of premature cardiovascular disease     Objective:    Today's Vitals   12/25/23 0957  BP: 122/74  Pulse: 61  Temp: (!) 97.1 F (36.2 C)  SpO2: 97%  Weight: 135 lb 3.2 oz (61.3 kg)  Height: 5' 4 (1.626 m)   Body mass index is 23.21 kg/m.     12/25/2023   10:02 AM 10/01/2023    1:52 PM 07/02/2023    1:26 PM 04/23/2023   11:25 AM 04/22/2023    4:28 PM 03/21/2023   12:51 PM 02/19/2023    2:53 PM  Advanced Directives  Does Patient Have a Medical Advance Directive? Yes Yes Yes Yes Yes Yes Yes  Type of Estate agent of [place];Living will;Out of facility DNR (pink MOST or yellow form) Healthcare Power of [place];Living will;Out of facility DNR (pink MOST or yellow form) Healthcare Power of [place];Out of facility DNR (pink MOST or yellow form) Healthcare Power of [place];Living will;Out of facility DNR (pink MOST or yellow form) Healthcare Power of [place];Living will;Out of facility DNR (pink MOST or yellow form) Healthcare Power of [place];Out of facility DNR (pink MOST or yellow form);Living will Healthcare Power of [place];Out of facility DNR (pink MOST or yellow form);Living will  Does patient want to make changes to medical advance directive? No - Patient declined No - Patient declined No - Patient declined No - Patient declined No - Patient declined No - Patient declined No - Patient declined  Copy of Healthcare Power of Attorney in Chart? Yes - validated most recent copy scanned in chart (See row information) Yes - validated most recent copy scanned in chart (See row information) Yes - validated most recent copy scanned in chart (See row information) Yes - validated most recent copy scanned in  chart (See row information) Yes - validated most recent copy scanned in chart (See row information) Yes - validated most recent copy scanned in chart (See row information) Yes - validated most recent copy scanned in chart (See row information)  Pre-existing out of facility DNR order (yellow form or pink MOST form)    Yellow form placed in chart (order not valid for inpatient use) Yellow form placed in chart (order not valid for inpatient use)      Current Medications (verified) Outpatient Encounter Medications as of 12/25/2023  Medication Sig   acetaminophen  (TYLENOL ) 500 MG tablet Take Two tablets in the morning and Take Two tablets at bedtime.   Ascorbic Acid (VITAMIN C) 1000 MG tablet Take 1,000 mg by mouth daily.   atorvastatin  (LIPITOR) 80 MG tablet TAKE 1 TABLET DAILY   cholecalciferol (VITAMIN D ) 1000 UNITS tablet Take 1,000 Units by mouth daily.   clopidogrel  (PLAVIX ) 75 MG tablet TAKE 1 TABLET DAILY   HYDROcodone -acetaminophen  (NORCO/VICODIN) 5-325 MG tablet Take 1 tablet by mouth every 6 (six) hours as needed.   levothyroxine  (SYNTHROID ) 112 MCG tablet Take 1 tablet (112 mcg total) by mouth daily.   Melatonin 10 MG TABS Take 1-2 tablets by mouth at bedtime.   metaxalone (SKELAXIN) 800 MG tablet Take 400 mg by mouth 3 (three) times daily.   metoprolol  succinate (TOPROL -XL) 25 MG 24 hr tablet Take 1 tablet (25 mg total) by mouth daily. Take with or immediately following a meal. PLEASE CALL [phone]  TO SCHEDULE YEARLY FOLLOW UP PRIOR TO NEXT REFILL REQUEST. THANK YOU.   Multiple Vitamins-Minerals (ICAPS AREDS 2 PO) Take by mouth.   Polyethylene Glycol 3350 (MIRALAX PO) Take by mouth.   predniSONE  (DELTASONE ) 10 MG tablet Take one tablet twice daily with food for 5 days, then one tablet once daily for 5 days.   risedronate  (ACTONEL ) 150 MG tablet Take 1 tablet (150 mg total) by mouth every 30 (thirty) days. with water on empty stomach, nothing by mouth or lie down for next 30 minutes.    traZODone  (DESYREL ) 50 MG tablet Take 1 tablet (50 mg total) by mouth at bedtime.   Vibegron  (GEMTESA ) 75 MG TABS Take 1 tablet (75 mg total) by mouth daily.   Vitamin D , Ergocalciferol , (DRISDOL ) 1.25 MG (50000 UNIT) CAPS capsule Take 1 capsule (50,000 Units total) by mouth every 7 (seven) days.   No facility-administered encounter medications on file as of 12/25/2023.    Allergies (verified) Patient has no known allergies.   History: Past Medical History:  Diagnosis Date   Arthritis    Complication of anesthesia    Depression    Hyperlipidemia    Osteopenia    Osteopenia    Sleep disorder    Thyroid  disease    Past Surgical History:  Procedure Laterality Date   ABDOMINAL HYSTERECTOMY     APPENDECTOMY     CATARACT EXTRACTION W/PHACO Right 03/03/2019   Procedure: CATARACT EXTRACTION PHACO AND INTRAOCULAR LENS PLACEMENT (IOC) RIGHT  00:44.0  16.1%  7.12;  Surgeon: Mittie Gaskin, MD;  Location: [facility];  Service: Ophthalmology;  Laterality: Right;   CATARACT EXTRACTION W/PHACO Left 03/24/2019   Procedure: CATARACT EXTRACTION PHACO AND INTRAOCULAR LENS PLACEMENT (IOC) LEFT  01:09.4  12.5%  8.73;  Surgeon: Mittie Gaskin, MD;  Location: [facility];  Service: Ophthalmology;  Laterality: Left;   IR RADIOLOGIST EVAL & MGMT  12/24/2021   IR RADIOLOGIST EVAL & MGMT  01/17/2022   TONSILLECTOMY AND ADENOIDECTOMY  1944   Family History  Problem Relation Age of Onset   Hypertension Father    Stroke Brother    Diabetes Brother    Cancer Neg Hx        no breast or colon   Social History   Socioeconomic History   Marital status: Married    Spouse name: Not on file   Number of children: Not on file   Years of education: Not on file   Highest education level: Not on file  Occupational History   Occupation: retired- Statistician of deeds for [employer]  Tobacco Use   Smoking status: Former    Current packs/day: 0.00    Types: Cigarettes    Quit  date: 06/03/1978    Years since quitting: 45.5    Passive exposure: Past   Smokeless tobacco: Never  Vaping Use   Vaping status: Never Used  Substance and Sexual Activity   Alcohol use: Yes    Comment: wine, social   Drug use: No   Sexual activity: Not on file  Other Topics Concern   Not on file  Social History Narrative   Requests DNR --order done 12/24/10 and now on display   Has living will   Husband is health care POA---alternate would be nieces (both sides)   No tube feedings if cognitively unaware   Social Drivers of Health   Financial Resource Strain: Not on file  Food Insecurity: No Food Insecurity (11/12/2023)   Hunger Vital Sign  Worried About Programme researcher, broadcasting/film/video in the Last Year: Never true    Ran Out of Food in the Last Year: Never true  Transportation Needs: No Transportation Needs (11/12/2023)   PRAPARE - Administrator, Civil Service (Medical): No    Lack of Transportation (Non-Medical): No  Physical Activity: Not on file  Stress: Not on file  Social Connections: Not on file    Tobacco Counseling Counseling given: Not Answered   Clinical Intake:  Pre-visit preparation completed: Yes  Pain : No/denies pain     BMI - recorded: 23.21 Nutritional Status: BMI of 19-24  Normal Nutritional Risks: None Diabetes: No  How often do you need to have someone help you when you read instructions, pamphlets, or other written materials from your doctor or pharmacy?: 1 - Never         Activities of Daily Living    12/25/2023   10:14 AM  In your present state of health, do you have any difficulty performing the following activities:  Hearing? 1  Vision? 0  Difficulty concentrating or making decisions? 1  Comment a little trouble remembering due to a lot to remember  Walking or climbing stairs? 0  Dressing or bathing? 0  Doing errands, shopping? 0  Preparing Food and eating ? N  Using the Toilet? N  In the past six months, have you accidently  leaked urine? N  Do you have problems with loss of bowel control? N  Managing your Medications? N  Managing your Finances? N  Housekeeping or managing your Housekeeping? N    Patient Care Team: Abdul Fine, MD as PCP - General (Family Medicine) [name], MD as PCP - Cardiology (Cardiology)  Indicate any recent Medical Services you may have received from other than [facility] providers in the past year (date may be approximate).     Assessment:   This is a routine wellness examination for [place].  Hearing/Vision screen Vision Screening - Comments:: [facility] Last Exam: 06/2023   Goals Addressed   None    Depression Screen    12/25/2023   10:03 AM 11/12/2023    2:53 PM 10/01/2023    1:52 PM 04/23/2023    2:50 PM 03/21/2023   12:51 PM 01/31/2023    1:10 PM 10/30/2022    2:11 PM  PHQ 2/9 Scores  PHQ - 2 Score 0 0 0 0 0 0 0    Fall Risk    12/25/2023   10:03 AM 11/12/2023    2:53 PM 10/01/2023    1:52 PM 04/23/2023    2:50 PM 03/21/2023   12:51 PM  Fall Risk   Falls in the past year? 0 0 0 0 0  Number falls in past yr: 0 0 0 0 0  Injury with Fall? 0 0 0 0 0  Risk for fall due to : No Fall Risks Impaired balance/gait No Fall Risks No Fall Risks   Follow up Falls evaluation completed Falls evaluation completed Falls evaluation completed Falls evaluation completed     MEDICARE RISK AT HOME:    TIMED UP AND GO:  Was the test performed?  No    Cognitive Function:        12/25/2023   10:03 AM  6CIT Screen  What Year? 0 points  What month? 0 points  What time? 0 points  Count back from 20 0 points  Months in reverse 0 points  Repeat phrase 0 points  Total Score 0  points    Immunizations Immunization History  Administered Date(s) Administered   Hepatitis A, Adult 02/15/2003   Hepatitis B, ADULT 08/16/2003   Influenza Split 04/03/2011, 03/11/2012, 03/03/2013   Influenza Whole 03/03/2010   Influenza, High Dose Seasonal PF 03/29/2017,  03/16/2019, 03/19/2022, 03/04/2023   Influenza,inj,Quad PF,6+ Mos 03/21/2015   Influenza-Unspecified 03/03/2014, 03/03/2018, 03/21/2020   MODERNA COVID-19 SARS-COV-2 PEDS BIVALENT BOOSTER 31yr-23yr 06/04/2019, 07/13/2019, 04/18/2020, 10/19/2020, 02/22/2021   Moderna Covid-19 Fall Seasonal Vaccine 6yrs & older 09/10/2022   Moderna SARS-COV2 Booster Vaccination 10/30/2021, 04/12/2022   Moderna Sars-Covid-2 Vaccination 06/18/2019, 07/19/2019, 04/18/2020, 10/17/2020, 02/28/2023   OPV 02/15/2003   PPD Test 08/16/2021   Pneumococcal Conjugate-13 12/07/2013   Pneumococcal Polysaccharide-23 06/04/2007, 01/02/2017   Td 05/01/2010   Tdap 04/06/2022   Typhoid Live 02/15/2003   Unspecified SARS-COV-2 Vaccination 09/12/2023   Zoster Recombinant(Shingrix) 01/21/2019, 04/09/2019   Zoster, Live 03/03/2010    TDAP status: Up to date  Flu Vaccine status: Up to date  Pneumococcal vaccine status: Up to date  Covid-19 vaccine status: Information provided on how to obtain vaccines.   Qualifies for Shingles Vaccine? Yes   Zostavax completed No   Shingrix Completed?: Yes  Screening Tests Health Maintenance  Topic Date Due   Hepatitis B Vaccines (2 of 3 - 19+ 3-dose series) 09/13/2003   INFLUENZA VACCINE  01/02/2024   COVID-19 Vaccine (15 - 2024-25 season) 03/13/2024   Medicare Annual Wellness (AWV)  12/24/2024   DTaP/Tdap/Td (3 - Td or Tdap) 04/06/2032   Pneumococcal Vaccine: 50+ Years  Completed   DEXA SCAN  Completed   Zoster Vaccines- Shingrix  Completed   HPV VACCINES  Aged Out   Meningococcal B Vaccine  Aged Out    Health Maintenance  Health Maintenance Due  Topic Date Due   Hepatitis B Vaccines (2 of 3 - 19+ 3-dose series) 09/13/2003    Colorectal cancer screening: No longer required.   Mammogram status: No longer required due to age.  Bone Density status: Completed 10/2023. Results reflect: Bone density results: OSTEOPOROSIS. Repeat every 2 years.  Lung Cancer Screening: (Low  Dose CT Chest recommended if Age 42-80 years, 20 pack-year currently smoking OR have quit w/in 15years.) does not qualify.    Additional Screening:  Hepatitis C Screening: does not qualify; Completed   Vision Screening: Recommended annual ophthalmology exams for early detection of glaucoma and other disorders of the eye. Is the patient up to date with their annual eye exam?  Yes  Who is the provider or what is the name of the office in which the patient attends annual eye exams? [facility] If pt is not established with a provider, would they like to be referred to a provider to establish care? No .   Dental Screening: Recommended annual dental exams for proper oral hygiene   Community Resource Referral / Chronic Care Management: CRR required this visit?  No   CCM required this visit?  No     Plan:     I have personally reviewed and noted the following in the patient's chart:   Medical and social history Use of alcohol, tobacco or illicit drugs  Current medications and supplements including opioid prescriptions. Patient is currently taking opioid prescriptions. Information provided to patient regarding non-opioid alternatives. Patient advised to discuss non-opioid treatment plan with their provider. Functional ability and status Nutritional status Physical activity Advanced directives List of other physicians Hospitalizations, surgeries, and ER visits in previous 12 months Vitals Screenings to include cognitive, depression, and falls  Referrals and appointments  In addition, I have reviewed and discussed with patient certain preventive protocols, quality metrics, and best practice recommendations. A written personalized care plan for preventive services as well as general preventive health recommendations were provided to patient.     Harlene MARLA An, NP   12/25/2023

## 2023-12-25 NOTE — Telephone Encounter (Signed)
Patient Aware and agreed.

## 2023-12-30 ENCOUNTER — Telehealth: Payer: Self-pay | Admitting: *Deleted

## 2023-12-30 NOTE — Telephone Encounter (Signed)
 Patient's Husband came by the [facility] this morning and stated that patient is [facility] for her back pain on 8/14 at 10:00.   They are wanting to know if she should stop her Plavix  before the injection and if so, when she should start it back.   Please Advise.

## 2023-12-31 NOTE — Telephone Encounter (Signed)
 Patient notified

## 2023-12-31 NOTE — Telephone Encounter (Signed)
 Thank you for this message. She should stop taking it 7 days before the procedure and can resume 24 hours after.   Best,  VB

## 2024-01-06 DIAGNOSIS — M5416 Radiculopathy, lumbar region: Secondary | ICD-10-CM | POA: Diagnosis not present

## 2024-01-06 DIAGNOSIS — M48062 Spinal stenosis, lumbar region with neurogenic claudication: Secondary | ICD-10-CM | POA: Diagnosis not present

## 2024-01-14 ENCOUNTER — Other Ambulatory Visit: Payer: Self-pay

## 2024-01-14 MED ORDER — METOPROLOL SUCCINATE ER 25 MG PO TB24: 25.0000 mg | ORAL_TABLET | Freq: Every day | ORAL | 0 refills | Status: DC

## 2024-01-23 ENCOUNTER — Telehealth: Payer: Self-pay | Admitting: Cardiology

## 2024-01-23 MED ORDER — METOPROLOL SUCCINATE ER 25 MG PO TB24: 25.0000 mg | ORAL_TABLET | Freq: Every day | ORAL | 0 refills | Status: DC

## 2024-01-23 NOTE — Telephone Encounter (Signed)
 Patient is aware of refill.

## 2024-01-23 NOTE — Telephone Encounter (Signed)
*  STAT* If patient is at the pharmacy, call can be transferred to refill team.   1. Which medications need to be refilled? (please list name of each medication and dose if known) metoprolol  succinate (TOPROL -XL) 25 MG 24 hr tablet   2. Which pharmacy/location (including street and city if local pharmacy) is medication to be sent to?  [facility] - [place], [place] - [address]    3. Do they need a 30 day or 90 day supply? 90   Patient has appt on 10/14

## 2024-02-05 DIAGNOSIS — M5416 Radiculopathy, lumbar region: Secondary | ICD-10-CM | POA: Diagnosis not present

## 2024-02-05 DIAGNOSIS — M48062 Spinal stenosis, lumbar region with neurogenic claudication: Secondary | ICD-10-CM | POA: Diagnosis not present

## 2024-02-06 ENCOUNTER — Encounter: Payer: Self-pay | Admitting: Family Medicine

## 2024-02-06 ENCOUNTER — Other Ambulatory Visit: Payer: Self-pay | Admitting: Family Medicine

## 2024-02-06 DIAGNOSIS — M5416 Radiculopathy, lumbar region: Secondary | ICD-10-CM

## 2024-02-12 ENCOUNTER — Ambulatory Visit
Admission: RE | Admit: 2024-02-12 | Discharge: 2024-02-12 | Disposition: A | Discharge status: Home / Self Care | Source: Ambulatory Visit | Attending: Family Medicine | Admitting: Family Medicine

## 2024-02-12 DIAGNOSIS — M47817 Spondylosis without myelopathy or radiculopathy, lumbosacral region: Secondary | ICD-10-CM | POA: Diagnosis not present

## 2024-02-12 DIAGNOSIS — M5416 Radiculopathy, lumbar region: Secondary | ICD-10-CM

## 2024-02-12 DIAGNOSIS — M4807 Spinal stenosis, lumbosacral region: Secondary | ICD-10-CM | POA: Diagnosis not present

## 2024-02-12 DIAGNOSIS — M5126 Other intervertebral disc displacement, lumbar region: Secondary | ICD-10-CM | POA: Diagnosis not present

## 2024-02-18 ENCOUNTER — Encounter: Admitting: Student

## 2024-02-20 ENCOUNTER — Encounter: Admitting: Student

## 2024-02-25 ENCOUNTER — Encounter: Admitting: Student

## 2024-02-25 DIAGNOSIS — M48062 Spinal stenosis, lumbar region with neurogenic claudication: Secondary | ICD-10-CM | POA: Diagnosis not present

## 2024-02-25 DIAGNOSIS — M5416 Radiculopathy, lumbar region: Secondary | ICD-10-CM | POA: Diagnosis not present

## 2024-02-25 DIAGNOSIS — M6281 Muscle weakness (generalized): Secondary | ICD-10-CM | POA: Diagnosis not present

## 2024-02-25 DIAGNOSIS — M545 Low back pain, unspecified: Secondary | ICD-10-CM | POA: Diagnosis not present

## 2024-02-26 ENCOUNTER — Encounter: Payer: Self-pay | Admitting: Nurse Practitioner

## 2024-02-26 ENCOUNTER — Non-Acute Institutional Stay: Admitting: Nurse Practitioner

## 2024-02-26 VITALS — BP 134/84 | HR 73 | Temp 97.0°F | Ht 64.0 in | Wt 135.6 lb

## 2024-02-26 DIAGNOSIS — I471 Supraventricular tachycardia, unspecified: Secondary | ICD-10-CM

## 2024-02-26 DIAGNOSIS — E782 Mixed hyperlipidemia: Secondary | ICD-10-CM

## 2024-02-26 DIAGNOSIS — N1831 Chronic kidney disease, stage 3a: Secondary | ICD-10-CM

## 2024-02-26 DIAGNOSIS — R7303 Prediabetes: Secondary | ICD-10-CM

## 2024-02-26 DIAGNOSIS — E559 Vitamin D deficiency, unspecified: Secondary | ICD-10-CM | POA: Diagnosis not present

## 2024-02-26 DIAGNOSIS — F419 Anxiety disorder, unspecified: Secondary | ICD-10-CM

## 2024-02-26 DIAGNOSIS — M48061 Spinal stenosis, lumbar region without neurogenic claudication: Secondary | ICD-10-CM

## 2024-02-26 DIAGNOSIS — E039 Hypothyroidism, unspecified: Secondary | ICD-10-CM

## 2024-02-26 DIAGNOSIS — N3281 Overactive bladder: Secondary | ICD-10-CM | POA: Diagnosis not present

## 2024-02-26 DIAGNOSIS — G4700 Insomnia, unspecified: Secondary | ICD-10-CM | POA: Diagnosis not present

## 2024-02-26 DIAGNOSIS — M8000XA Age-related osteoporosis with current pathological fracture, unspecified site, initial encounter for fracture: Secondary | ICD-10-CM

## 2024-02-26 DIAGNOSIS — G459 Transient cerebral ischemic attack, unspecified: Secondary | ICD-10-CM | POA: Diagnosis not present

## 2024-02-26 MED ORDER — METAXALONE 800 MG PO TABS: 400.0000 mg | ORAL_TABLET | Freq: Three times a day (TID) | ORAL | 0 refills | Status: AC

## 2024-02-26 NOTE — Patient Instructions (Signed)
 To come back to clinic Monday 03/01/2024 for lab draw at 7:30

## 2024-02-26 NOTE — Progress Notes (Signed)
 Careteam: Patient Care Team: Abdul Fine, MD as PCP - General (Family Medicine) [name], MD as PCP - Cardiology (Cardiology) PLACE OF SERVICE:  [facility]   Advanced Directive information Does Patient Have a Medical Advance Directive?: Yes, Type of Advance Directive: Healthcare Power of [name];Living will;Out of facility DNR (pink MOST or yellow form), Does patient want to make changes to medical advance directive?: No - Patient declined  No Known Allergies  Chief Complaint  Patient presents with   Medical Management of Chronic Issues    Medical Management of Chronic Issues. 3 Month follow up. Scheduled for Back Injection 11/11. Requesting Refill on Metaxalone  to help with pain when she does Yoga.      HPI: Patient is a 88 y.o. female seen in today see for 3 month follow up  Discussed the use of AI scribe software for clinical note transcription with the patient, who gave verbal consent to proceed.  History of Present Illness Robin Logan is an 88 year old female with chronic back pain who presents for a three-month follow-up.  She experiences persistent back pain radiating down her neck and into her leg. She has been receiving injections for pain management, which were effective until February 2024, but the most recent injection did not alleviate her symptoms. An MRI was performed recently, and the nurse practitioner told her that she needs back surgery. 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 She has been prescribed physical therapy and plans to attend sessions weekly for six weeks.  She has a history of sciatica, diagnosed after an urgent care visit where an x-ray was performed. She has been using pain medication as needed, including hydrocodone  and a muscle relaxer, prefers a muscle relaxer over hydrocodone . She finds more effective and less sedating. She is not currently using prednisone ,  although it remains on her medication list.  She experiences sleep disturbances, waking up around 2 AM, but does not attribute this to pain. She takes medication at night to aid sleep but still struggles to achieve seven to eight hours of rest.  She is on Lipitor for cholesterol management, which was last checked in January 2025 and was within normal limits. She also takes Plavix  due to a history of a transient ischemic attack (TIA) and Synthroid  for thyroid  management. She sees a cardiologist annually for paroxysmal supraventricular tachycardia and is on metoprolol  for this condition.no palpitations or chest pains.   She is on Actonel  for osteoporosis and takes over-the-counter vitamin D  at a dose of 1000 units daily. She was previously found to be vitamin D  deficient last year but did not receive the prescribed high-dose vitamin D . S  She reports regular bowel movements with the aid of Miralax. No swelling, vaginal bleeding, or blood in the stool.     Review of Systems:  Review of Systems  Constitutional:  Negative for chills, fever and weight loss.  HENT:  Negative for tinnitus.   Respiratory:  Negative for cough, sputum production and shortness of breath.   Cardiovascular:  Negative for chest pain, palpitations and leg swelling.  Gastrointestinal:  Negative for abdominal pain, constipation, diarrhea and heartburn.  Genitourinary:  Negative for dysuria, frequency and urgency.  Musculoskeletal:  Positive for back pain. Negative for falls, joint pain and myalgias.  Skin: Negative.   Neurological:  Negative for dizziness and headaches.  Psychiatric/Behavioral:  Negative for depression and memory loss. The patient  is nervous/anxious. The patient does not have insomnia.     Past Medical History:  Diagnosis Date   Arthritis    Complication of anesthesia    Depression    Hyperlipidemia    Osteopenia    Osteopenia    Sleep disorder    Thyroid  disease    Past Surgical History:   Procedure Laterality Date   ABDOMINAL HYSTERECTOMY     APPENDECTOMY     CATARACT EXTRACTION W/PHACO Right 03/03/2019   Procedure: CATARACT EXTRACTION PHACO AND INTRAOCULAR LENS PLACEMENT (IOC) RIGHT  00:44.0  16.1%  7.12;  Surgeon: Mittie Gaskin, MD;  Location: [facility];  Service: Ophthalmology;  Laterality: Right;   CATARACT EXTRACTION W/PHACO Left 03/24/2019   Procedure: CATARACT EXTRACTION PHACO AND INTRAOCULAR LENS PLACEMENT (IOC) LEFT  01:09.4  12.5%  8.73;  Surgeon: Mittie Gaskin, MD;  Location: [facility];  Service: Ophthalmology;  Laterality: Left;   IR RADIOLOGIST EVAL & MGMT  12/24/2021   IR RADIOLOGIST EVAL & MGMT  01/17/2022   TONSILLECTOMY AND ADENOIDECTOMY  06/03/1942   Social History:   reports that she quit smoking about 45 years ago. Her smoking use included cigarettes. She has been exposed to tobacco smoke. She has never used smokeless tobacco. She reports current alcohol use. She reports that she does not use drugs.  Family History  Problem Relation Age of Onset   Hypertension Father    Stroke Brother    Diabetes Brother    Cancer Neg Hx        no breast or colon    Medications: Patient's Medications  New Prescriptions   No medications on file  Previous Medications   ACETAMINOPHEN  (TYLENOL ) 500 MG TABLET    Take Two tablets in the morning and Take Two tablets at bedtime.   ASCORBIC ACID (VITAMIN C) 1000 MG TABLET    Take 1,000 mg by mouth daily.   ATORVASTATIN  (LIPITOR) 80 MG TABLET    TAKE 1 TABLET DAILY   CHOLECALCIFEROL (VITAMIN D ) 1000 UNITS TABLET    Take 1,000 Units by mouth daily.   CLOPIDOGREL  (PLAVIX ) 75 MG TABLET    TAKE 1 TABLET DAILY   HYDROCODONE -ACETAMINOPHEN  (NORCO/VICODIN) 5-325 MG TABLET    Take 1 tablet by mouth every 6 (six) hours as needed.   LEVOTHYROXINE  (SYNTHROID ) 112 MCG TABLET    Take 1 tablet (112 mcg total) by mouth daily.   MELATONIN 10 MG TABS    Take 1-2 tablets by mouth at bedtime.   METAXALONE   (SKELAXIN ) 800 MG TABLET    Take 400 mg by mouth 3 (three) times daily.   METOPROLOL  SUCCINATE (TOPROL -XL) 25 MG 24 HR TABLET    Take 1 tablet (25 mg total) by mouth daily. Take with or immediately following a meal.Keep appointment in Oct 2025 to continue with refills   MULTIPLE VITAMINS-MINERALS (ICAPS AREDS 2 PO)    Take by mouth.   POLYETHYLENE GLYCOL 3350 (MIRALAX PO)    Take by mouth.   PREDNISONE  (DELTASONE ) 10 MG TABLET    Take one tablet twice daily with food for 5 days, then one tablet once daily for 5 days.   RISEDRONATE  (ACTONEL ) 150 MG TABLET    Take 1 tablet (150 mg total) by mouth every 30 (thirty) days. with water on empty stomach, nothing by mouth or lie down for next 30 minutes.   TRAZODONE  (DESYREL ) 50 MG TABLET    Take 1 tablet (50 mg total) by mouth at bedtime.   VIBEGRON  (GEMTESA )  75 MG TABS    Take 1 tablet (75 mg total) by mouth daily.   VITAMIN D , ERGOCALCIFEROL , (DRISDOL ) 1.25 MG (50000 UNIT) CAPS CAPSULE    Take 1 capsule (50,000 Units total) by mouth every 7 (seven) days.  Modified Medications   No medications on file  Discontinued Medications   No medications on file    Physical Exam:  Vitals:   02/26/24 1017  BP: 134/84  Pulse: 73  Temp: (!) 97 F (36.1 C)  SpO2: 98%  Weight: 135 lb 9.6 oz (61.5 kg)  Height: 5' 4 (1.626 m)   Body mass index is 23.28 kg/m. Wt Readings from Last 3 Encounters:  02/26/24 135 lb 9.6 oz (61.5 kg)  12/25/23 135 lb 3.2 oz (61.3 kg)  11/12/23 138 lb (62.6 kg)    Physical Exam Constitutional:      General: She is not in acute distress.    Appearance: She is well-developed. She is not diaphoretic.  HENT:     Head: Normocephalic and atraumatic.     Mouth/Throat:     Pharynx: No oropharyngeal exudate.  Eyes:     Conjunctiva/sclera: Conjunctivae normal.     Pupils: Pupils are equal, round, and reactive to light.  Cardiovascular:     Rate and Rhythm: Normal rate and regular rhythm.     Heart sounds: Normal heart sounds.   Pulmonary:     Effort: Pulmonary effort is normal.     Breath sounds: Normal breath sounds.  Abdominal:     General: Bowel sounds are normal.     Palpations: Abdomen is soft.  Musculoskeletal:     Cervical back: Normal range of motion and neck supple.     Right lower leg: No edema.     Left lower leg: No edema.  Skin:    General: Skin is warm and dry.  Neurological:     Mental Status: She is alert.  Psychiatric:        Mood and Affect: Mood normal.     Labs reviewed: Basic Metabolic Panel: Recent Labs    06/09/23 0804  NA 138  K 4.2  CL 104  CO2 28  GLUCOSE 88  BUN 26*  CREATININE 0.87  CALCIUM  9.2  TSH 0.54   Liver Function Tests: Recent Labs    06/09/23 0804  AST 27  ALT 34*  BILITOT 0.3  PROT 6.5   No results for input(s): LIPASE, AMYLASE in the last 8760 hours. No results for input(s): AMMONIA in the last 8760 hours. CBC: Recent Labs    06/09/23 0804  WBC 5.4  NEUTROABS 3,283  HGB 11.8  HCT 36.1  MCV 97.3  PLT 228   Lipid Panel: Recent Labs    06/09/23 0804  CHOL 189  HDL 91  LDLCALC 80  TRIG 94  CHOLHDL 2.1   TSH: Recent Labs    06/09/23 0804  TSH 0.54   A1C: Lab Results  Component Value Date   HGBA1C 5.9 (H) 06/09/2023     Assessment/Plan Assessment and Plan Assessment & Plan Chronic low back pain with bilateral sciatica Persistent pain despite interventions. Followed by phys medicine who suggests surgery, but she cannot proceed due to caregiving duties. Current management includes physical therapy and yoga. Considering Cymbalta for pain and anxiety. - Continue physical therapy weekly for six weeks. - Continue yoga as tolerated. - Refill skelaxin  400 mg every 8 hours as needed for pain management. - Consider Cymbalta for future pain and anxiety management. - Scheduled  back injection on November 11.  Anxiety symptoms Exacerbated by surgery recommendation and back pain Discussed Cymbalta for anxiety and pain. -  Consider Cymbalta for future anxiety and pain management.  Osteoporosis Managed with Actonel , calcium , and vitamin D . - Continue Actonel . - Continue calcium  and vitamin D  supplementation. - Check vitamin D  levels with upcoming blood work.  Vitamin D  deficiency Previous deficiency. Currently on 1000 units daily. Missed previous prescription for 50,000 units weekly. - Order blood work to check vitamin D  levels. - Consider prescribing 50,000 units weekly pending results.  Hyperlipidemia Managed with Lipitor. Cholesterol levels were adequate in January. - Continue Lipitor. - Order blood work to check cholesterol levels.  Hypothyroidism Managed with Synthroid . Due for thyroid  function test. - Continue Synthroid . - Order thyroid  function test with upcoming blood work.  Paroxysmal supraventricular tachycardia Controlled on metoprolol .   Transient ischemic attack (TIA) On Plavix  for stroke prevention. - Continue Plavix . - Order blood work to check blood counts.  Constipation Managed with Miralax, likely due to pain medication. - Continue Miralax as needed.  Insomnia Continues on trazodone , continue medication and lifestyle modifications    Stage 3a chronic kidney disease (HCC) -Chronic and stable Encourage proper hydration Follow metabolic panel Avoid nephrotoxic meds (NSAIDS) - CMP  Overactive bladder Controlled on gemtesa   Prediabetes Continue lifestyle modifications - Hemoglobin A1c   Next appt: 4 months for routine follow up.  Berdena Cisek K. Caro BODILY  [facility] [phone]

## 2024-02-27 ENCOUNTER — Other Ambulatory Visit: Payer: Self-pay | Admitting: Nurse Practitioner

## 2024-02-27 NOTE — Telephone Encounter (Unsigned)
 Copied from CRM #[id number]. Topic: Clinical - Medication Refill >> Feb 27, 2024 11:52 AM Miquel SAILOR wrote: Medication: HYDROcodone -acetaminophen  (NORCO/VICODIN) 5-325 MG tablet   Has the patient contacted their pharmacy? Yes (Agent: If no, request that the patient contact the pharmacy for the refill. If patient does not wish to contact the pharmacy document the reason why and proceed with request.) (Agent: If yes, when and what did the pharmacy advise?)  This is the patient's preferred pharmacy:    [id number] [address] [facility] - [address] [place] [address] Phone: [phone] Fax: [phone]  Is this the correct pharmacy for this prescription? Yes If no, delete pharmacy and type the correct one.   Has the prescription been filled recently? Yes  Is the patient out of the medication? No   Has the patient been seen for an appointment in the last year OR does the patient have an upcoming appointment? Yes  Can we respond through MyChart? Yes  Agent: Please be advised that Rx refills may take up to 3 business days. We ask that you follow-up with your pharmacy.

## 2024-03-01 ENCOUNTER — Telehealth: Payer: Self-pay

## 2024-03-01 DIAGNOSIS — G459 Transient cerebral ischemic attack, unspecified: Secondary | ICD-10-CM | POA: Diagnosis not present

## 2024-03-01 DIAGNOSIS — E039 Hypothyroidism, unspecified: Secondary | ICD-10-CM | POA: Diagnosis not present

## 2024-03-01 DIAGNOSIS — E559 Vitamin D deficiency, unspecified: Secondary | ICD-10-CM | POA: Diagnosis not present

## 2024-03-01 DIAGNOSIS — E782 Mixed hyperlipidemia: Secondary | ICD-10-CM | POA: Diagnosis not present

## 2024-03-01 DIAGNOSIS — N1831 Chronic kidney disease, stage 3a: Secondary | ICD-10-CM | POA: Diagnosis not present

## 2024-03-01 DIAGNOSIS — R7303 Prediabetes: Secondary | ICD-10-CM | POA: Diagnosis not present

## 2024-03-01 DIAGNOSIS — I471 Supraventricular tachycardia, unspecified: Secondary | ICD-10-CM | POA: Diagnosis not present

## 2024-03-01 NOTE — Telephone Encounter (Signed)
 No she goes to pain management who prescribes her pain medication

## 2024-03-01 NOTE — Telephone Encounter (Signed)
   Patient is requesting a refill of the following medications: Requested Prescriptions   Pending Prescriptions Disp Refills   HYDROcodone -acetaminophen  (NORCO/VICODIN) 5-325 MG tablet 30 tablet 0    Sig: Take 1 tablet by mouth every 6 (six) hours as needed.    Date of last refill: Unknown, we did not fill   Refill amount: N/A  Treatment agreement date: No, notation made on Jan 2026 appointment

## 2024-03-01 NOTE — Telephone Encounter (Signed)
 I left a detailed message on the voicemail for the pharmacy with providers response

## 2024-03-02 ENCOUNTER — Ambulatory Visit: Payer: Self-pay | Admitting: Nurse Practitioner

## 2024-03-02 DIAGNOSIS — E039 Hypothyroidism, unspecified: Secondary | ICD-10-CM

## 2024-03-02 LAB — COMPREHENSIVE METABOLIC PANEL WITH GFR
AG Ratio: 2 (calc) (ref 1.0–2.5)
ALT: 42 U/L — ABNORMAL HIGH (ref 6–29)
AST: 28 U/L (ref 10–35)
Albumin: 4.3 g/dL (ref 3.6–5.1)
Alkaline phosphatase (APISO): 76 U/L (ref 37–153)
BUN: 18 mg/dL (ref 7–25)
CO2: 29 mmol/L (ref 20–32)
Calcium: 9.2 mg/dL (ref 8.6–10.4)
Chloride: 105 mmol/L (ref 98–110)
Creat: 0.9 mg/dL (ref 0.60–0.95)
Globulin: 2.1 g/dL (ref 1.9–3.7)
Glucose, Bld: 86 mg/dL (ref 65–99)
Potassium: 4.2 mmol/L (ref 3.5–5.3)
Sodium: 142 mmol/L (ref 135–146)
Total Bilirubin: 0.5 mg/dL (ref 0.2–1.2)
Total Protein: 6.4 g/dL (ref 6.1–8.1)
eGFR: 62 mL/min/1.73m2 (ref >=60)

## 2024-03-02 LAB — LIPID PANEL
Cholesterol: 176 mg/dL (ref <200)
HDL: 74 mg/dL (ref >=50)
LDL Cholesterol (Calc): 77 mg/dL
Non-HDL Cholesterol (Calc): 102 mg/dL (ref <130)
Total CHOL/HDL Ratio: 2.4 (calc) (ref <5.0)
Triglycerides: 150 mg/dL — ABNORMAL HIGH (ref <150)

## 2024-03-02 LAB — CBC WITH DIFFERENTIAL/PLATELET
Absolute Lymphocytes: 1194 {cells}/uL (ref 850–3900)
Absolute Monocytes: 451 {cells}/uL (ref 200–950)
Basophils Absolute: 28 {cells}/uL (ref 0–200)
Basophils Relative: 0.6 %
Eosinophils Absolute: 291 {cells}/uL (ref 15–500)
Eosinophils Relative: 6.2 %
HCT: 37.1 % (ref 35.0–45.0)
Hemoglobin: 12.1 g/dL (ref 11.7–15.5)
MCH: 31.6 pg (ref 27.0–33.0)
MCHC: 32.6 g/dL (ref 32.0–36.0)
MCV: 96.9 fL (ref 80.0–100.0)
MPV: 10.6 fL (ref 7.5–12.5)
Monocytes Relative: 9.6 %
Neutro Abs: 2735 {cells}/uL (ref 1500–7800)
Neutrophils Relative %: 58.2 %
Platelets: 234 Thousand/uL (ref 140–400)
RBC: 3.83 Million/uL (ref 3.80–5.10)
RDW: 11.7 % (ref 11.0–15.0)
Total Lymphocyte: 25.4 %
WBC: 4.7 Thousand/uL (ref 3.8–10.8)

## 2024-03-02 LAB — VITAMIN D 25 HYDROXY (VIT D DEFICIENCY, FRACTURES): Vit D, 25-Hydroxy: 31 ng/mL (ref 30–100)

## 2024-03-02 LAB — HEMOGLOBIN A1C
Hgb A1c MFr Bld: 6 % — ABNORMAL HIGH (ref <5.7)
Mean Plasma Glucose: 126 mg/dL
eAG (mmol/L): 7 mmol/L

## 2024-03-02 LAB — TSH: TSH: 0.3 m[IU]/L — ABNORMAL LOW (ref 0.40–4.50)

## 2024-03-02 MED ORDER — LEVOTHYROXINE SODIUM 100 MCG PO TABS: 100.0000 ug | ORAL_TABLET | Freq: Every day | ORAL | 1 refills | Status: DC

## 2024-03-04 ENCOUNTER — Telehealth: Payer: Self-pay

## 2024-03-04 NOTE — Telephone Encounter (Signed)
 Patient notified and agreed.  Order placed and printed for labs 05/03/2024.  Placed in [facility] [employer].

## 2024-03-04 NOTE — Telephone Encounter (Signed)
 I spoke with patients spouse and informed him that I left a detailed message with Harlene An, NP response on 03/01/24: Patient needs to get pain medication from her pain management doctor.   Mr.Pavlovich stated he did not recall getting a message, however he understands now and plans to call pain management provider

## 2024-03-04 NOTE — Telephone Encounter (Signed)
-----   Message from Harlene MARLA An sent at 03/02/2024  8:38 PM EDT ----- TSH is low meaning she is getting too much thyroid  medication. To decrease synthroid  to 100 mcg daily (rx sent) and follow up TSH in 8 weeks at [facility]  Blood counts, electrolytes, glucose, liver and kidney function are stable A1c unchanged at 6.0- in prediabetic range Cholesterol looks good.  Vit d level on low side of normal-improved slightly from last check.  To increase OTC vit d to 2000 units daily at this time  ----- Message ----- From: Interface, [facility] Lab Results In Sent: 03/01/2024   1:47 PM EDT To: Harlene MARLA An, NP

## 2024-03-04 NOTE — Telephone Encounter (Signed)
 Copied from CRM #[id number]. Topic: Clinical - Medication Refill >> Feb 27, 2024 11:52 AM Miquel SAILOR wrote: Medication: HYDROcodone -acetaminophen  (NORCO/VICODIN) 5-325 MG tablet   Has the patient contacted their pharmacy? Yes (Agent: If no, request that the patient contact the pharmacy for the refill. If patient does not wish to contact the pharmacy document the reason why and proceed with request.) (Agent: If yes, when and what did the pharmacy advise?)  This is the patient's preferred pharmacy:    [id number] [address] [facility] - [address] [place] [address] Phone: [phone] Fax: [phone]  Is this the correct pharmacy for this prescription? Yes If no, delete pharmacy and type the correct one.   Has the prescription been filled recently? Yes  Is the patient out of the medication? No   Has the patient been seen for an appointment in the last year OR does the patient have an upcoming appointment? Yes  Can we respond through MyChart? Yes  Agent: Please be advised that Rx refills may take up to 3 business days. We ask that you follow-up with your pharmacy. >> Mar 04, 2024 11:01 AM Miquel SAILOR wrote: Medication: HYDROcodone -acetaminophen  (NORCO/VICODIN) 5-325 MG tablet-Patient and husband Rodgers calling on update for medication.Called office and transferred

## 2024-03-15 ENCOUNTER — Other Ambulatory Visit: Payer: Self-pay | Admitting: Student

## 2024-03-16 ENCOUNTER — Encounter: Payer: Self-pay | Admitting: Cardiology

## 2024-03-16 ENCOUNTER — Ambulatory Visit: Attending: Cardiology | Admitting: Cardiology

## 2024-03-16 VITALS — BP 134/70 | HR 71 | Ht 63.0 in | Wt 136.5 lb

## 2024-03-16 DIAGNOSIS — I6389 Other cerebral infarction: Secondary | ICD-10-CM | POA: Insufficient documentation

## 2024-03-16 DIAGNOSIS — I471 Supraventricular tachycardia, unspecified: Secondary | ICD-10-CM | POA: Insufficient documentation

## 2024-03-16 NOTE — Progress Notes (Signed)
 Cardiology Office Note:    Date:  03/16/2024   ID:  Robin Logan, DOB 17-May-1936, MRN [id number]  PCP:  Abdul Fine, MD  [facility] Cardiologist:  Redell Cave, MD  [facility] Electrophysiologist:  None   Referring MD: Abdul Fine, MD   Chief Complaint  Patient presents with   Follow-up    12 month f/u no complaints today. Meds reviewed verbally with pt.    History of Present Illness:    Robin Logan is a 88 y.o. female with a hx of hyperlipidemia, paroxysmal SVT, TIA, COPD, former smoker, spinal stenosis who presents for hospital follow-up.   Feels well, denies chest pain, shortness of breath or palpitations.  Compliant with medications as prescribed.  Has lower back pain/sciatica, has been told she will need back surgery.  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  Feels well from a cardiac perspective.   Prior notes Cardiac monitor: 08/27/2021 showed paroxysmal supraventricular tachycardia runs, no evidence for atrial fibrillation or atrial flutter. Echocardiogram 11/2019, normal systolic function, EF 60 to 65%. Ultrasound 11/2019 bilateral arthrosclerosis plaque right greater than left.  No hemodynamically significant stenosis. Admitted to the hospital with TIA, telemetry monitoring showed SVT heart rates up to 149, Cardizem  was administered with resolution.    Past Medical History:  Diagnosis Date   Arthritis    Complication of anesthesia    Depression    Hyperlipidemia    Osteopenia    Osteopenia    Sleep disorder    Thyroid  disease     Past Surgical History:  Procedure Laterality Date   ABDOMINAL HYSTERECTOMY     APPENDECTOMY     CATARACT EXTRACTION W/PHACO Right 03/03/2019   Procedure: CATARACT EXTRACTION PHACO AND INTRAOCULAR LENS PLACEMENT (IOC) RIGHT  00:44.0  16.1%  7.12;  Surgeon: Mittie Gaskin, MD;  Location: [facility];  Service: Ophthalmology;  Laterality: Right;   CATARACT  EXTRACTION W/PHACO Left 03/24/2019   Procedure: CATARACT EXTRACTION PHACO AND INTRAOCULAR LENS PLACEMENT (IOC) LEFT  01:09.4  12.5%  8.73;  Surgeon: Mittie Gaskin, MD;  Location: [facility];  Service: Ophthalmology;  Laterality: Left;   IR RADIOLOGIST EVAL & MGMT  12/24/2021   IR RADIOLOGIST EVAL & MGMT  01/17/2022   TONSILLECTOMY AND ADENOIDECTOMY  06/03/1944    Current Medications: Current Meds  Medication Sig   acetaminophen  (TYLENOL ) 500 MG tablet Take Two tablets in the morning and Take Two tablets at bedtime.   Ascorbic Acid (VITAMIN C) 1000 MG tablet Take 1,000 mg by mouth daily.   atorvastatin  (LIPITOR) 80 MG tablet TAKE 1 TABLET DAILY   cholecalciferol (VITAMIN D ) 1000 UNITS tablet Take 1,000 Units by mouth daily.   clopidogrel  (PLAVIX ) 75 MG tablet TAKE 1 TABLET DAILY   HYDROcodone -acetaminophen  (NORCO/VICODIN) 5-325 MG tablet Take 1 tablet by mouth every 6 (six) hours as needed.   levothyroxine  (SYNTHROID ) 100 MCG tablet Take 1 tablet (100 mcg total) by mouth daily.   metaxalone  (SKELAXIN ) 800 MG tablet Take 0.5 tablets (400 mg total) by mouth 3 (three) times daily.   metoprolol  succinate (TOPROL -XL) 25 MG 24 hr tablet Take 1 tablet (25 mg total) by mouth daily. Take with or immediately following a meal.Keep appointment in Oct 2025 to continue with refills   Multiple Vitamins-Minerals (ICAPS AREDS 2 PO) Take by mouth.   Polyethylene Glycol 3350 (MIRALAX PO) Take by mouth.   risedronate  (ACTONEL ) 150 MG tablet TAKE 1 TABLET EVERY 30 DAYS WITH WATER ON AN EMPTY  STOMACH, NOTHING BY MOUTH OR LIE DOWN FOR NEXT 30 MINUTES   traZODone  (DESYREL ) 50 MG tablet Take 1 tablet (50 mg total) by mouth at bedtime.   Vibegron  (GEMTESA ) 75 MG TABS Take 1 tablet (75 mg total) by mouth daily.     Allergies:   Patient has no known allergies.   Social History   Socioeconomic History   Marital status: Married    Spouse name: Not on file   Number of children: Not on file   Years  of education: Not on file   Highest education level: Not on file  Occupational History   Occupation: retired- Statistician of deeds for [employer]  Tobacco Use   Smoking status: Former    Current packs/day: 0.00    Types: Cigarettes    Quit date: 06/03/1978    Years since quitting: 45.8    Passive exposure: Past   Smokeless tobacco: Never  Vaping Use   Vaping status: Never Used  Substance and Sexual Activity   Alcohol use: Yes    Comment: wine, social   Drug use: No   Sexual activity: Not on file  Other Topics Concern   Not on file  Social History Narrative   Requests DNR --order done 12/24/10 and now on display   Has living will   Husband is health care POA---alternate would be nieces (both sides)   No tube feedings if cognitively unaware   Social Drivers of Health   Financial Resource Strain: Not on file  Food Insecurity: No Food Insecurity (11/12/2023)   Hunger Vital Sign    Worried About Running Out of Food in the Last Year: Never true    Ran Out of Food in the Last Year: Never true  Transportation Needs: No Transportation Needs (11/12/2023)   PRAPARE - Administrator, Civil Service (Medical): No    Lack of Transportation (Non-Medical): No  Physical Activity: Not on file  Stress: Not on file  Social Connections: Not on file     Family History: The patient's family history includes Diabetes in her brother; Hypertension in her father; Stroke in her brother. There is no history of Cancer.  ROS:   Please see the history of present illness.     All other systems reviewed and are negative.  EKGs/Labs/Other Studies Reviewed:    The following studies were reviewed today:   EKG:  EKG is  ordered today.  The ekg ordered today demonstrates normal sinus rhythm  Recent Labs: 03/01/2024: ALT 42; BUN 18; Creat 0.90; Hemoglobin 12.1; Platelets 234; Potassium 4.2; Sodium 142; TSH 0.30  Recent Lipid Panel    Component Value Date/Time   CHOL 176 03/01/2024 0719    TRIG 150 (H) 03/01/2024 0719   HDL 74 03/01/2024 0719   CHOLHDL 2.4 03/01/2024 0719   VLDL 11 07/28/2021 0447   LDLCALC 77 03/01/2024 0719   LDLDIRECT 161.5 11/30/2012 1553    Physical Exam:    VS:  BP 134/70 (BP Location: Left Arm, Patient Position: Sitting, Cuff Size: Normal)   Pulse 71   Ht 5' 3 (1.6 m)   Wt 136 lb 8 oz (61.9 kg)   SpO2 98%   BMI 24.18 kg/m     Wt Readings from Last 3 Encounters:  03/16/24 136 lb 8 oz (61.9 kg)  02/26/24 135 lb 9.6 oz (61.5 kg)  12/25/23 135 lb 3.2 oz (61.3 kg)     GEN:  Well nourished, well developed in no acute distress HEENT:  Normal NECK: No JVD; No carotid bruits CARDIAC: RRR, no murmurs, rubs, gallops RESPIRATORY:  Clear to auscultation without rales, wheezing or rhonchi  ABDOMEN: Soft, non-tender, non-distended MUSCULOSKELETAL:  No edema; low back tenderness. SKIN: Warm and dry NEUROLOGIC:  Alert and oriented x 3 PSYCHIATRIC:  Normal affect   ASSESSMENT:    1. Paroxysmal SVT (supraventricular tachycardia)   2. Other cerebral infarction [facility])    PLAN:    In order of problems listed above:  paroxysmal SVT.   Denies palpitations, continue Toprol -XL 25 mg daily.  Echo 2023 EF 60 to 65%. History of CVA.  Continue Plavix , Lipitor 80 mg daily as prescribed.  Follow-up yearly or as needed   Medication Adjustments/Labs and Tests Ordered: Current medicines are reviewed at length with the patient today.  Concerns regarding medicines are outlined above.  Orders Placed This Encounter  Procedures   EKG 12-Lead    No orders of the defined types were placed in this encounter.    Patient Instructions  Medication Instructions:  Your physician recommends that you continue on your current medications as directed. Please refer to the Current Medication list given to you today.   *If you need a refill on your cardiac medications before your next appointment, please call your pharmacy*  Lab Work: No labs ordered today  If you  have labs (blood work) drawn today and your tests are completely normal, you will receive your results only by: MyChart Message (if you have MyChart) OR A paper copy in the mail If you have any lab test that is abnormal or we need to change your treatment, we will call you to review the results.  Testing/Procedures: No test ordered today   Follow-Up: At [facility], you and your health needs are our priority.  As part of our continuing mission to provide you with exceptional heart care, our providers are all part of one team.  This team includes your primary Cardiologist (physician) and Advanced Practice Providers or APPs (Physician Assistants and Nurse Practitioners) who all work together to provide you with the care you need, when you need it.  Your next appointment:   As needed            Signed, Redell Cave, MD  03/16/2024 9:40 AM    [facility]

## 2024-03-16 NOTE — Patient Instructions (Signed)

## 2024-03-26 DIAGNOSIS — Z23 Encounter for immunization: Secondary | ICD-10-CM | POA: Diagnosis not present

## 2024-04-11 ENCOUNTER — Other Ambulatory Visit: Payer: Self-pay | Admitting: Cardiology

## 2024-04-13 ENCOUNTER — Encounter: Admitting: Nurse Practitioner

## 2024-04-13 DIAGNOSIS — M5416 Radiculopathy, lumbar region: Secondary | ICD-10-CM | POA: Diagnosis not present

## 2024-04-13 DIAGNOSIS — M48062 Spinal stenosis, lumbar region with neurogenic claudication: Secondary | ICD-10-CM | POA: Diagnosis not present

## 2024-04-20 ENCOUNTER — Encounter: Admitting: Nurse Practitioner

## 2024-05-03 DIAGNOSIS — E039 Hypothyroidism, unspecified: Secondary | ICD-10-CM | POA: Diagnosis not present

## 2024-05-03 LAB — TSH: TSH: 0.83 m[IU]/L (ref 0.40–4.50)

## 2024-05-04 ENCOUNTER — Non-Acute Institutional Stay: Admitting: Nurse Practitioner

## 2024-05-04 ENCOUNTER — Encounter: Payer: Self-pay | Admitting: Nurse Practitioner

## 2024-05-04 VITALS — BP 134/82 | HR 78 | Temp 97.3°F | Ht 63.0 in | Wt 138.0 lb

## 2024-05-04 DIAGNOSIS — M48061 Spinal stenosis, lumbar region without neurogenic claudication: Secondary | ICD-10-CM

## 2024-05-04 DIAGNOSIS — M5416 Radiculopathy, lumbar region: Secondary | ICD-10-CM | POA: Diagnosis not present

## 2024-05-04 DIAGNOSIS — E039 Hypothyroidism, unspecified: Secondary | ICD-10-CM | POA: Diagnosis not present

## 2024-05-04 DIAGNOSIS — M48062 Spinal stenosis, lumbar region with neurogenic claudication: Secondary | ICD-10-CM | POA: Diagnosis not present

## 2024-05-04 NOTE — Progress Notes (Signed)
 Careteam: Patient Care Team: Caro Harlene POUR, NP as PCP - General (Geriatric Medicine) [name], MD as PCP - Cardiology (Cardiology) PLACE OF SERVICE:  [facility]   Advanced Directive information Does Patient Have a Medical Advance Directive?: Yes, Type of Advance Directive: Healthcare Power of [place];Living will;Out of facility DNR (pink MOST or yellow form), Does patient want to make changes to medical advance directive?: No - Patient declined  No Known Allergies  Chief Complaint  Patient presents with   Discuss Labwork    Discuss Labwork.      HPI: Patient is a 88 y.o. female seen in today  Discussed the use of AI scribe software for clinical note transcription with the patient, who gave verbal consent to proceed.  History of Present Illness SANYLA SUMMEY is an 88 year old female with hypothyroidism who presents for a thyroid  follow-up.  She was previously on Synthroid  112 mcg, which was reduced to 100 mcg due to a low TSH level. Her recent lab work shows a TSH level of 0.83, which is within the normal range but on the lower end. She sometimes feels 'really hot,' which may be related to her thyroid  levels. She is currently continuing with the 100 mcg dose of Synthroid .  She discusses her back pain management. She has been receiving injections for her back pain and is scheduled to follow up with a nurse practitioner to determine if further injections or surgery are necessary. There is some discrepancy in the recommendations from her healthcare providers regarding the need for back surgery. 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 She has previously taken hydrocodone -acetaminophen  for back pain but is not currently using it regularly, only occasionally. She has also been participating in physical therapy and yoga to manage her symptoms.     Review of Systems:  Review of Systems  Constitutional:  Negative for chills, fever  and weight loss.  HENT:  Negative for tinnitus.   Respiratory:  Negative for cough, sputum production and shortness of breath.   Cardiovascular:  Negative for chest pain, palpitations and leg swelling.  Gastrointestinal:  Negative for abdominal pain, constipation, diarrhea and heartburn.  Genitourinary:  Negative for dysuria, frequency and urgency.  Musculoskeletal:  Negative for back pain, falls, joint pain and myalgias.  Skin: Negative.   Neurological:  Negative for dizziness and headaches.  Psychiatric/Behavioral:  Negative for depression and memory loss. The patient does not have insomnia.     Past Medical History:  Diagnosis Date   Arthritis    Complication of anesthesia    Depression    Hyperlipidemia    Osteopenia    Osteopenia    Sleep disorder    Thyroid  disease    Past Surgical History:  Procedure Laterality Date   ABDOMINAL HYSTERECTOMY     APPENDECTOMY     CATARACT EXTRACTION W/PHACO Right 03/03/2019   Procedure: CATARACT EXTRACTION PHACO AND INTRAOCULAR LENS PLACEMENT (IOC) RIGHT  00:44.0  16.1%  7.12;  Surgeon: Mittie Gaskin, MD;  Location: [facility];  Service: Ophthalmology;  Laterality: Right;   CATARACT EXTRACTION W/PHACO Left 03/24/2019   Procedure: CATARACT EXTRACTION PHACO AND INTRAOCULAR LENS PLACEMENT (IOC) LEFT  01:09.4  12.5%  8.73;  Surgeon: Mittie Gaskin, MD;  Location: [facility];  Service: Ophthalmology;  Laterality: Left;   IR RADIOLOGIST EVAL & MGMT  12/24/2021   IR RADIOLOGIST EVAL & MGMT  01/17/2022   TONSILLECTOMY AND ADENOIDECTOMY  06/03/1944  Social History:   reports that she quit smoking about 45 years ago. Her smoking use included cigarettes. She has been exposed to tobacco smoke. She has never used smokeless tobacco. She reports current alcohol use. She reports that she does not use drugs.  Family History  Problem Relation Age of Onset   Hypertension Father    Stroke Brother    Diabetes Brother     Cancer Neg Hx        no breast or colon    Medications: Patient's Medications  New Prescriptions   No medications on file  Previous Medications   ACETAMINOPHEN  (TYLENOL ) 500 MG TABLET    Take Two tablets in the morning and Take Two tablets at bedtime.   ASCORBIC ACID (VITAMIN C) 1000 MG TABLET    Take 1,000 mg by mouth daily.   ATORVASTATIN  (LIPITOR) 80 MG TABLET    TAKE 1 TABLET DAILY   CHOLECALCIFEROL (VITAMIN D ) 1000 UNITS TABLET    Take 1,000 Units by mouth daily.   CLOPIDOGREL  (PLAVIX ) 75 MG TABLET    TAKE 1 TABLET DAILY   HYDROCODONE -ACETAMINOPHEN  (NORCO/VICODIN) 5-325 MG TABLET    Take 1 tablet by mouth every 6 (six) hours as needed.   LEVOTHYROXINE  (SYNTHROID ) 100 MCG TABLET    Take 1 tablet (100 mcg total) by mouth daily.   MELATONIN 10 MG TABS    Take 1-2 tablets by mouth at bedtime.   METAXALONE  (SKELAXIN ) 800 MG TABLET    Take 0.5 tablets (400 mg total) by mouth 3 (three) times daily.   METOPROLOL  SUCCINATE (TOPROL -XL) 25 MG 24 HR TABLET    Take 1 tablet (25 mg total) by mouth daily. Take with or immediately following a meal.   MULTIPLE VITAMINS-MINERALS (ICAPS AREDS 2 PO)    Take by mouth.   POLYETHYLENE GLYCOL 3350 (MIRALAX PO)    Take by mouth.   RISEDRONATE  (ACTONEL ) 150 MG TABLET    TAKE 1 TABLET EVERY 30 DAYS WITH WATER ON AN EMPTY STOMACH, NOTHING BY MOUTH OR LIE DOWN FOR NEXT 30 MINUTES   TRAZODONE  (DESYREL ) 50 MG TABLET    Take 1 tablet (50 mg total) by mouth at bedtime.   VIBEGRON  (GEMTESA ) 75 MG TABS    Take 1 tablet (75 mg total) by mouth daily.  Modified Medications   No medications on file  Discontinued Medications   No medications on file    Physical Exam:  Vitals:   05/04/24 0935  BP: 134/82  Pulse: 78  Temp: (!) 97.3 F (36.3 C)  SpO2: 97%  Weight: 138 lb (62.6 kg)  Height: 5' 3 (1.6 m)   Body mass index is 24.45 kg/m. Wt Readings from Last 3 Encounters:  05/04/24 138 lb (62.6 kg)  03/16/24 136 lb 8 oz (61.9 kg)  02/26/24 135 lb 9.6 oz  (61.5 kg)    Physical Exam Constitutional:      Appearance: Normal appearance.  Cardiovascular:     Rate and Rhythm: Normal rate and regular rhythm.     Pulses: Normal pulses.     Heart sounds: Normal heart sounds.  Pulmonary:     Effort: Pulmonary effort is normal.     Breath sounds: Normal breath sounds.  Neurological:     Mental Status: She is alert. Mental status is at baseline.  Psychiatric:        Mood and Affect: Mood normal.     Labs reviewed: Basic Metabolic Panel: Recent Labs    06/09/23 0804 03/01/24 0719 05/03/24  0735  NA 138 142  --   K 4.2 4.2  --   CL 104 105  --   CO2 28 29  --   GLUCOSE 88 86  --   BUN 26* 18  --   CREATININE 0.87 0.90  --   CALCIUM  9.2 9.2  --   TSH 0.54 0.30* 0.83   Liver Function Tests: Recent Labs    06/09/23 0804 03/01/24 0719  AST 27 28  ALT 34* 42*  BILITOT 0.3 0.5  PROT 6.5 6.4   No results for input(s): LIPASE, AMYLASE in the last 8760 hours. No results for input(s): AMMONIA in the last 8760 hours. CBC: Recent Labs    06/09/23 0804 03/01/24 0719  WBC 5.4 4.7  NEUTROABS 3,283 2,735  HGB 11.8 12.1  HCT 36.1 37.1  MCV 97.3 96.9  PLT 228 234   Lipid Panel: Recent Labs    06/09/23 0804 03/01/24 0719  CHOL 189 176  HDL 91 74  LDLCALC 80 77  TRIG 94 150*  CHOLHDL 2.1 2.4   TSH: Recent Labs    06/09/23 0804 03/01/24 0719 05/03/24 0735  TSH 0.54 0.30* 0.83   A1C: Lab Results  Component Value Date   HGBA1C 6.0 (H) 03/01/2024     Assessment/Plan Assessment and Plan Assessment & Plan Hypothyroidism Previously TSH low,  Synthroid  adjusted to 100 mcg, now TSH at 0.83, within normal range. - Continue Synthroid  100 mcg daily. - Sent six-month supply of Synthroid  with refill.  Spinal stenosis of lumbar region, unspecified whether neurogenic claudication present Followed by phys medicine, s/p injection and PT Doing well at this time and not needing pain medication as much.  Pain managed by  phys team.    Aneesa Romey K. Caro BODILY  [facility] [phone]

## 2024-05-16 DIAGNOSIS — Z03818 Encounter for observation for suspected exposure to other biological agents ruled out: Secondary | ICD-10-CM | POA: Diagnosis not present

## 2024-05-16 DIAGNOSIS — J209 Acute bronchitis, unspecified: Secondary | ICD-10-CM | POA: Diagnosis not present

## 2024-05-16 DIAGNOSIS — B9689 Other specified bacterial agents as the cause of diseases classified elsewhere: Secondary | ICD-10-CM | POA: Diagnosis not present

## 2024-05-16 DIAGNOSIS — J019 Acute sinusitis, unspecified: Secondary | ICD-10-CM | POA: Diagnosis not present

## 2024-05-19 ENCOUNTER — Encounter: Payer: Self-pay | Admitting: Internal Medicine

## 2024-05-19 ENCOUNTER — Non-Acute Institutional Stay: Admitting: Internal Medicine

## 2024-05-19 VITALS — BP 138/86 | HR 80 | Temp 97.1°F | Ht 63.0 in | Wt 137.4 lb

## 2024-05-19 DIAGNOSIS — J189 Pneumonia, unspecified organism: Secondary | ICD-10-CM | POA: Diagnosis not present

## 2024-05-19 DIAGNOSIS — Z66 Do not resuscitate: Secondary | ICD-10-CM | POA: Diagnosis not present

## 2024-05-19 DIAGNOSIS — J101 Influenza due to other identified influenza virus with other respiratory manifestations: Secondary | ICD-10-CM | POA: Diagnosis not present

## 2024-05-19 MED ORDER — ALBUTEROL SULFATE HFA 108 (90 BASE) MCG/ACT IN AERS: 2.0000 | INHALATION_SPRAY | RESPIRATORY_TRACT | 0 refills | Status: DC | PRN

## 2024-05-19 MED ORDER — PREDNISONE 20 MG PO TABS: 40.0000 mg | ORAL_TABLET | Freq: Every day | ORAL | 0 refills | Status: AC

## 2024-05-19 MED ORDER — AZITHROMYCIN 500 MG PO TABS: 500.0000 mg | ORAL_TABLET | Freq: Every day | ORAL | 0 refills | Status: AC

## 2024-05-19 MED ORDER — SPACER/AERO-HOLDING CHAMBERS DEVI: 0 refills | Status: DC

## 2024-05-19 MED ORDER — CEFADROXIL 500 MG PO CAPS: 500.0000 mg | ORAL_CAPSULE | Freq: Two times a day (BID) | ORAL | 0 refills | Status: AC

## 2024-05-19 NOTE — Progress Notes (Signed)
 [facility] Outpatient Progress Note      Careteam: Patient Care Team: Robin Harlene POUR, NP as PCP - General (Geriatric Medicine) [name], MD as PCP - Cardiology (Cardiology) PLACE OF SERVICE: [facility]   Advanced Directive information     Allergies[1]   Chief Complaint  Patient presents with   Flu Sx    Flu Sx no Better. Seen at Urgent Care on Sunday and tested Positive for Flu A. Given Azithromycin , Prednisone  and Benzonatate.      HPI: Patient is a 88 y.o. female seen in today in [facility] outpatient clinic.  Discussed the use of AI scribe software for clinical note transcription with the patient, who gave verbal consent to proceed.  History of Present Illness  Patient seen and examined.  Husband diagnosed with influenza.  He is in SNF recovering.  She was seen in [place] urgent care several days ago.  Had a respiratory viral panel performed.  She was not given any results.  I looked at it.  She has influenza A.  She has been on Zithromax , prednisone , Tessalon Perles without any relief.  She still coughing quite a bit.  No fevers.  Having body aches.  No nausea or vomiting.  No diarrhea.  Having occasional coughing fits.  Appetite is poor.    Review of Systems: Review of Systems  Constitutional:  Positive for fever and malaise/fatigue.  HENT: Negative.    Eyes: Negative.   Respiratory:  Positive for cough and sputum production.   Cardiovascular: Negative.   Gastrointestinal: Negative.   Genitourinary: Negative.   Musculoskeletal:  Positive for myalgias.  Skin: Negative.   Neurological: Negative.   Endo/Heme/Allergies: Negative.   Psychiatric/Behavioral: Negative.    All other systems reviewed and are negative.    Past Medical History:  Diagnosis Date   Arthritis    Complication of anesthesia    Depression    Hyperlipidemia    Osteopenia    Osteopenia    Sleep disorder    Thyroid  disease    Past Surgical History:  Procedure  Laterality Date   ABDOMINAL HYSTERECTOMY     APPENDECTOMY     CATARACT EXTRACTION W/PHACO Right 03/03/2019   Procedure: CATARACT EXTRACTION PHACO AND INTRAOCULAR LENS PLACEMENT (IOC) RIGHT  00:44.0  16.1%  7.12;  Surgeon: Mittie Gaskin, MD;  Location: [facility];  Service: Ophthalmology;  Laterality: Right;   CATARACT EXTRACTION W/PHACO Left 03/24/2019   Procedure: CATARACT EXTRACTION PHACO AND INTRAOCULAR LENS PLACEMENT (IOC) LEFT  01:09.4  12.5%  8.73;  Surgeon: Mittie Gaskin, MD;  Location: [facility];  Service: Ophthalmology;  Laterality: Left;   IR RADIOLOGIST EVAL & MGMT  12/24/2021   IR RADIOLOGIST EVAL & MGMT  01/17/2022   TONSILLECTOMY AND ADENOIDECTOMY  06/03/1942   Social History:  reports that she quit smoking about 45 years ago. Her smoking use included cigarettes. She has been exposed to tobacco smoke. She has never used smokeless tobacco. She reports current alcohol use. She reports that she does not use drugs.   Family History  Problem Relation Age of Onset   Hypertension Father    Stroke Brother    Diabetes Brother    Cancer Neg Hx        no breast or colon     Medications: Patient's Medications  New Prescriptions   ALBUTEROL  (VENTOLIN  HFA) 108 (90 BASE) MCG/ACT INHALER    Inhale 2 puffs into the lungs every 4 (four) hours as needed for wheezing  or shortness of breath (or coughing).   AZITHROMYCIN  (ZITHROMAX ) 500 MG TABLET    Take 1 tablet (500 mg total) by mouth daily for 5 days.   CEFADROXIL  (DURICEF) 500 MG CAPSULE    Take 1 capsule (500 mg total) by mouth 2 (two) times daily for 5 days.   PREDNISONE  (DELTASONE ) 20 MG TABLET    Take 2 tablets (40 mg total) by mouth daily with breakfast for 5 days.   SPACER/AERO-HOLDING CHAMBERS DEVI    Dispense 1 spacer to use with inhaler  Previous Medications   ACETAMINOPHEN  (TYLENOL ) 500 MG TABLET    Take Two tablets in the morning and Take Two tablets at bedtime.   ASCORBIC ACID (VITAMIN C) 1000  MG TABLET    Take 1,000 mg by mouth daily.   ATORVASTATIN  (LIPITOR) 80 MG TABLET    TAKE 1 TABLET DAILY   BENZONATATE (TESSALON) 200 MG CAPSULE    Take 200 mg by mouth 3 (three) times daily as needed.   CHOLECALCIFEROL (VITAMIN D ) 1000 UNITS TABLET    Take 1,000 Units by mouth daily.   CLOPIDOGREL  (PLAVIX ) 75 MG TABLET    TAKE 1 TABLET DAILY   DEXTROMETHORPHAN-GUAIFENESIN (MUCINEX DM) 30-600 MG 12HR TABLET    Take 1 tablet by mouth 2 (two) times daily.   HYDROCODONE -ACETAMINOPHEN  (NORCO/VICODIN) 5-325 MG TABLET    Take 1 tablet by mouth every 6 (six) hours as needed.   LEVOTHYROXINE  (SYNTHROID ) 100 MCG TABLET    Take 1 tablet (100 mcg total) by mouth daily.   MELATONIN 10 MG TABS    Take 1-2 tablets by mouth at bedtime.   METAXALONE  (SKELAXIN ) 800 MG TABLET    Take 0.5 tablets (400 mg total) by mouth 3 (three) times daily.   METOPROLOL  SUCCINATE (TOPROL -XL) 25 MG 24 HR TABLET    Take 1 tablet (25 mg total) by mouth daily. Take with or immediately following a meal.   MULTIPLE VITAMINS-MINERALS (ICAPS AREDS 2 PO)    Take by mouth.   POLYETHYLENE GLYCOL 3350 (MIRALAX PO)    Take by mouth.   RISEDRONATE  (ACTONEL ) 150 MG TABLET    TAKE 1 TABLET EVERY 30 DAYS WITH WATER ON AN EMPTY STOMACH, NOTHING BY MOUTH OR LIE DOWN FOR NEXT 30 MINUTES   TRAZODONE  (DESYREL ) 50 MG TABLET    Take 1 tablet (50 mg total) by mouth at bedtime.   VIBEGRON  (GEMTESA ) 75 MG TABS    Take 1 tablet (75 mg total) by mouth daily.  Modified Medications   No medications on file  Discontinued Medications   AZITHROMYCIN  (ZITHROMAX ) 250 MG TABLET    Take 250 mg by mouth daily.   PREDNISONE  (DELTASONE ) 10 MG TABLET    Take 10 mg by mouth 2 (two) times daily with a meal.     Physical Exam:   Vitals:   05/19/24 1306  BP: 138/86  Pulse: 80  Temp: (!) 97.1 F (36.2 C)  SpO2: 98%  Weight: 137 lb 6.4 oz (62.3 kg)  Height: 5' 3 (1.6 m)   Body mass index is 24.34 kg/m. Wt Readings from Last 3 Encounters:  05/19/24 137 lb 6.4  oz (62.3 kg)  05/04/24 138 lb (62.6 kg)  03/16/24 136 lb 8 oz (61.9 kg)     Physical Exam Vitals and nursing note reviewed.  Constitutional:      General: She is not in acute distress.    Appearance: She is normal weight. She is not toxic-appearing or diaphoretic.  HENT:  Head: Normocephalic and atraumatic.     Nose: Congestion present.  Eyes:     General: No scleral icterus. Cardiovascular:     Rate and Rhythm: Normal rate and regular rhythm.  Pulmonary:     Effort: Pulmonary effort is normal.     Breath sounds: Rales present.     Comments: right lower lobe rales.  Occasional rhonchorous breath sounds.  Productive cough. Abdominal:     General: Abdomen is flat. Bowel sounds are normal. There is no distension.     Palpations: Abdomen is soft.  Skin:    General: Skin is warm and dry.     Capillary Refill: Capillary refill takes less than 2 seconds.  Neurological:     General: No focal deficit present.     Mental Status: She is alert and oriented to person, place, and time.    Physical Exam    Labs reviewed: Basic Metabolic Panel: Recent Labs    06/09/23 0804 03/01/24 0719 05/03/24 0735  NA 138 142  --   K 4.2 4.2  --   CL 104 105  --   CO2 28 29  --   GLUCOSE 88 86  --   BUN 26* 18  --   CREATININE 0.87 0.90  --   CALCIUM  9.2 9.2  --   TSH 0.54 0.30* 0.83   Liver Function Tests: Recent Labs    06/09/23 0804 03/01/24 0719  AST 27 28  ALT 34* 42*  BILITOT 0.3 0.5  PROT 6.5 6.4    CBC: Recent Labs    06/09/23 0804 03/01/24 0719  WBC 5.4 4.7  NEUTROABS 3,283 2,735  HGB 11.8 12.1  HCT 36.1 37.1  MCV 97.3 96.9  PLT 228 234   Lipid Panel: Recent Labs    06/09/23 0804 03/01/24 0719  CHOL 189 176  HDL 91 74  LDLCALC 80 77  TRIG 94 150*  CHOLHDL 2.1 2.4   TSH: Recent Labs    06/09/23 0804 03/01/24 0719 05/03/24 0735  TSH 0.54 0.30* 0.83   A1C: Lab Results  Component Value Date   HGBA1C 6.0 (H) 03/01/2024       Assessment/Plan Assessment and Plan Assessment & Plan      Assessment & Plan Influenza A Continue supportive care.  Tamiflu not indicated as her symptoms have been greater than 24 hours.    Pneumonia of right lower lobe due to infectious organism Patient having bronchospasms along with right lower lobe rales.  Start Duricef 500 mg twice daily for 5 days.  Zithromax  500 mg daily for 5 days.  Prednisone  40 mg daily for 5 days.  Albuterol  2 puffs every 4 hours as needed for shortness of breath, wheezing, cough.  Patient will also be provided a spacer device as she has not used an inhaler before.    Do not resuscitate Verified DNR/DNI status with patient. DNR form completed          Meds ordered this encounter  Medications   azithromycin  (ZITHROMAX ) 500 MG tablet    Sig: Take 1 tablet (500 mg total) by mouth daily for 5 days.    Dispense:  5 tablet    Refill:  0   cefadroxil  (DURICEF) 500 MG capsule    Sig: Take 1 capsule (500 mg total) by mouth 2 (two) times daily for 5 days.    Dispense:  10 capsule    Refill:  0   predniSONE  (DELTASONE ) 20 MG tablet    Sig: Take 2  tablets (40 mg total) by mouth daily with breakfast for 5 days.    Dispense:  10 tablet    Refill:  0   Spacer/Aero-Holding Chambers DEVI    Sig: Dispense 1 spacer to use with inhaler    Dispense:  1 Units    Refill:  0   albuterol  (VENTOLIN  HFA) 108 (90 Base) MCG/ACT inhaler    Sig: Inhale 2 puffs into the lungs every 4 (four) hours as needed for wheezing or shortness of breath (or coughing).    Dispense:  8 g    Refill:  0   Medications Discontinued During This Encounter  Medication Reason   azithromycin  (ZITHROMAX ) 250 MG tablet Discontinued by provider   predniSONE  (DELTASONE ) 10 MG tablet Discontinued by provider   No orders of the defined types were placed in this encounter.   Next appt: f/u prn  Camellia Door, DO [facility] [phone]     [1] No Known  Allergies

## 2024-05-19 NOTE — Assessment & Plan Note (Signed)
 Continue supportive care.  Tamiflu not indicated as her symptoms have been greater than 24 hours.

## 2024-05-20 ENCOUNTER — Other Ambulatory Visit: Payer: Self-pay

## 2024-05-20 MED ORDER — ATORVASTATIN CALCIUM 80 MG PO TABS: 80.0000 mg | ORAL_TABLET | Freq: Every day | ORAL | 1 refills | Status: AC

## 2024-05-20 NOTE — Assessment & Plan Note (Addendum)
 Verified DNR/DNI status with patient. DNR form completed

## 2024-05-20 NOTE — Addendum Note (Signed)
 Addended by: LAURENCE, Kanani Mowbray on: 05/20/2024 08:36 AM   Modules accepted: Level of Service

## 2024-06-04 ENCOUNTER — Other Ambulatory Visit: Payer: Self-pay | Admitting: *Deleted

## 2024-06-04 MED ORDER — LEVOTHYROXINE SODIUM 100 MCG PO TABS: 100.0000 ug | ORAL_TABLET | Freq: Every day | ORAL | 0 refills | Status: DC

## 2024-06-04 NOTE — Telephone Encounter (Signed)
 Patient walked into [facility] and stated that her Mail order is sending her Thyroid  medication out but it won't be until next week that she will receive it and she is out. Requesting a supply to be sent to local pharmacy . Sent as requested.

## 2024-06-08 ENCOUNTER — Other Ambulatory Visit: Payer: Self-pay | Admitting: *Deleted

## 2024-06-08 DIAGNOSIS — I6523 Occlusion and stenosis of bilateral carotid arteries: Secondary | ICD-10-CM

## 2024-06-08 MED ORDER — CLOPIDOGREL BISULFATE 75 MG PO TABS: 75.0000 mg | ORAL_TABLET | Freq: Every day | ORAL | 3 refills | Status: AC

## 2024-06-08 NOTE — Telephone Encounter (Signed)
 Patient walked into clinic requesting refill.

## 2024-06-21 ENCOUNTER — Encounter: Payer: Self-pay | Admitting: Nurse Practitioner

## 2024-06-21 ENCOUNTER — Other Ambulatory Visit: Payer: Self-pay

## 2024-06-21 DIAGNOSIS — G4700 Insomnia, unspecified: Secondary | ICD-10-CM

## 2024-06-21 MED ORDER — TRAZODONE HCL 50 MG PO TABS: 50.0000 mg | ORAL_TABLET | Freq: Every day | ORAL | 1 refills | Status: AC

## 2024-06-27 ENCOUNTER — Other Ambulatory Visit: Payer: Self-pay | Admitting: Nurse Practitioner

## 2024-06-29 ENCOUNTER — Encounter: Payer: Self-pay | Admitting: Nurse Practitioner

## 2024-07-08 ENCOUNTER — Encounter: Payer: Self-pay | Admitting: Nurse Practitioner

## 2024-07-08 ENCOUNTER — Non-Acute Institutional Stay: Admitting: Nurse Practitioner

## 2024-07-08 VITALS — BP 128/88 | HR 65 | Temp 98.5°F | Resp 20 | Wt 139.2 lb

## 2024-07-08 DIAGNOSIS — R7303 Prediabetes: Secondary | ICD-10-CM

## 2024-07-08 DIAGNOSIS — E039 Hypothyroidism, unspecified: Secondary | ICD-10-CM

## 2024-07-08 DIAGNOSIS — G479 Sleep disorder, unspecified: Secondary | ICD-10-CM

## 2024-07-08 DIAGNOSIS — I471 Supraventricular tachycardia, unspecified: Secondary | ICD-10-CM

## 2024-07-08 DIAGNOSIS — E782 Mixed hyperlipidemia: Secondary | ICD-10-CM

## 2024-07-08 DIAGNOSIS — E559 Vitamin D deficiency, unspecified: Secondary | ICD-10-CM

## 2024-07-08 DIAGNOSIS — M48061 Spinal stenosis, lumbar region without neurogenic claudication: Secondary | ICD-10-CM

## 2024-07-08 DIAGNOSIS — Z8673 Personal history of transient ischemic attack (TIA), and cerebral infarction without residual deficits: Secondary | ICD-10-CM

## 2024-07-08 DIAGNOSIS — N3281 Overactive bladder: Secondary | ICD-10-CM

## 2024-07-08 DIAGNOSIS — M8000XA Age-related osteoporosis with current pathological fracture, unspecified site, initial encounter for fracture: Secondary | ICD-10-CM

## 2024-07-08 MED ORDER — LEVOTHYROXINE SODIUM 100 MCG PO TABS: 100.0000 ug | ORAL_TABLET | Freq: Every day | ORAL | 1 refills | Status: AC

## 2024-07-08 NOTE — Patient Instructions (Signed)
 Recommended to take calcium  600 mg twice daily with Vitamin D  2000 units daily and weight bearing activity 30 mins/5 days a week For osteoporosis

## 2024-07-08 NOTE — Progress Notes (Signed)
 "   Careteam: Patient Care Team: Caro Harlene POUR, NP as PCP - General (Geriatric Medicine) [name], MD as PCP - Cardiology (Cardiology) PLACE OF SERVICE:  [facility]   Advanced Directive information Does Patient Have a Medical Advance Directive?: Yes, Type of Advance Directive: Healthcare Power of [place];Living will;Out of facility DNR (pink MOST or yellow form), Does patient want to make changes to medical advance directive?: No - Patient declined  Allergies[1]  Chief Complaint  Patient presents with   Medical Management of Chronic Issues    Medical management of Chronic Issues. 4 Month follow up     HPI: Patient is a 89 y.o. female seen in today TL follow up.  Discussed the use of AI scribe software for clinical note transcription with the patient, who gave verbal consent to proceed.  History of Present Illness Robin Logan is an 89 year old female who presents for a routine follow-up.  She is here for a follow-up regarding her thyroid  condition. She has been taking Synthroid  (levothyroxine ) 100 mcg daily, and her TSH levels were at goal during her last check in December. She requires a refill for her medication, which is managed through [facility].  She has a history of hyperlipidemia and is currently taking Lipitor 80 mg daily. Her cholesterol levels, including LDL, were at goal during her last blood work in September.  She is on a vitamin D  supplement for vitamin D  deficiency and has osteoporosis, for which she has been taking Actonel  once a month since 2023. She is not currently taking any calcium  supplements.   She has a history of supraventricular tachycardia (SVT) and is taking metoprolol  25 mg daily. She is also under the care of a cardiologist for this condition.  She has lumbar spinal stenosis and experiences back pain, for which she occasionally takes hydrocodone . She sees Dr. Avanell for pain management and receives injections  periodically.  She has a history of a minor stroke and is on Plavix  as a blood thinner.  She is in the prediabetic range with an A1c of 6.0 as of September, and her blood sugars are being monitored.  She had the flu and pneumonia in December but reports feeling much better now and is returning to her usual activities, including yoga.  She is a caregiver for her husband, who has mobility issues and uses a scooter. This responsibility has been a source of stress for her, especially during the winter months when she experiences 'cabin fever.'  She denies current use of melatonin for sleep but is on trazodone  and reports doing okay with her sleep. She is not currently using albuterol  or Mucinex, which were previously used during her bout with pneumonia.    Review of Systems:  Review of Systems  Constitutional:  Negative for chills, fever and weight loss.  HENT:  Negative for tinnitus.   Respiratory:  Negative for cough, sputum production and shortness of breath.   Cardiovascular:  Negative for chest pain, palpitations and leg swelling.  Gastrointestinal:  Negative for abdominal pain, constipation, diarrhea and heartburn.  Genitourinary:  Negative for dysuria, frequency and urgency.  Musculoskeletal:  Negative for back pain, falls, joint pain and myalgias.  Skin: Negative.   Neurological:  Negative for dizziness and headaches.  Psychiatric/Behavioral:  Negative for depression and memory loss. The patient does not have insomnia.     Past Medical History:  Diagnosis Date   Arthritis    Complication of anesthesia    Depression  Hyperlipidemia    Osteopenia    Osteopenia    Sleep disorder    Thyroid  disease    Past Surgical History:  Procedure Laterality Date   ABDOMINAL HYSTERECTOMY     APPENDECTOMY     CATARACT EXTRACTION W/PHACO Right 03/03/2019   Procedure: CATARACT EXTRACTION PHACO AND INTRAOCULAR LENS PLACEMENT (IOC) RIGHT  00:44.0  16.1%  7.12;  Surgeon: Mittie Gaskin, MD;  Location: [facility];  Service: Ophthalmology;  Laterality: Right;   CATARACT EXTRACTION W/PHACO Left 03/24/2019   Procedure: CATARACT EXTRACTION PHACO AND INTRAOCULAR LENS PLACEMENT (IOC) LEFT  01:09.4  12.5%  8.73;  Surgeon: Mittie Gaskin, MD;  Location: [facility];  Service: Ophthalmology;  Laterality: Left;   IR RADIOLOGIST EVAL & MGMT  12/24/2021   IR RADIOLOGIST EVAL & MGMT  01/17/2022   TONSILLECTOMY AND ADENOIDECTOMY  06[birth year]1944   Social History:   reports that she quit smoking about 46 years ago. Her smoking use included cigarettes. She has been exposed to tobacco smoke. She has never used smokeless tobacco. She reports current alcohol use. She reports that she does not use drugs.  Family History  Problem Relation Age of Onset   Hypertension Father    Stroke Brother    Diabetes Brother    Cancer Neg Hx        no breast or colon    Medications: Patient's Medications  New Prescriptions   No medications on file  Previous Medications   ACETAMINOPHEN  (TYLENOL ) 500 MG TABLET    Take Two tablets in the morning and Take Two tablets at bedtime.   ASCORBIC ACID (VITAMIN C) 1000 MG TABLET    Take 1,000 mg by mouth daily.   ATORVASTATIN  (LIPITOR) 80 MG TABLET    Take 1 tablet (80 mg total) by mouth daily.   CHOLECALCIFEROL (VITAMIN D ) 1000 UNITS TABLET    Take 1,000 Units by mouth daily.   CLOPIDOGREL  (PLAVIX ) 75 MG TABLET    Take 1 tablet (75 mg total) by mouth daily.   HYDROCODONE -ACETAMINOPHEN  (NORCO/VICODIN) 5-325 MG TABLET    Take 1 tablet by mouth every 6 (six) hours as needed.   METAXALONE  (SKELAXIN ) 800 MG TABLET    Take 0.5 tablets (400 mg total) by mouth 3 (three) times daily.   METOPROLOL  SUCCINATE (TOPROL -XL) 25 MG 24 HR TABLET    Take 1 tablet (25 mg total) by mouth daily. Take with or immediately following a meal.   MULTIPLE VITAMINS-MINERALS (ICAPS AREDS 2 PO)    Take by mouth.   POLYETHYLENE GLYCOL 3350 (MIRALAX PO)    Take by  mouth.   RISEDRONATE  (ACTONEL ) 150 MG TABLET    TAKE 1 TABLET EVERY 30 DAYS WITH WATER ON AN EMPTY STOMACH, NOTHING BY MOUTH OR LIE DOWN FOR NEXT 30 MINUTES   TRAZODONE  (DESYREL ) 50 MG TABLET    Take 1 tablet (50 mg total) by mouth at bedtime.   VIBEGRON  (GEMTESA ) 75 MG TABS    Take 1 tablet (75 mg total) by mouth daily.  Modified Medications   Modified Medication Previous Medication   LEVOTHYROXINE  (SYNTHROID ) 100 MCG TABLET levothyroxine  (SYNTHROID ) 100 MCG tablet      Take 1 tablet (100 mcg total) by mouth daily.    TAKE 1 TABLET BY MOUTH EVERY DAY  Discontinued Medications   ALBUTEROL  (VENTOLIN  HFA) 108 (90 BASE) MCG/ACT INHALER    Inhale 2 puffs into the lungs every 4 (four) hours as needed for wheezing or shortness of breath (or coughing).  DEXTROMETHORPHAN-GUAIFENESIN (MUCINEX DM) 30-600 MG 12HR TABLET    Take 1 tablet by mouth 2 (two) times daily.   MELATONIN 10 MG TABS    Take 1-2 tablets by mouth at bedtime.   SPACER/AERO-HOLDING CHAMBERS DEVI    Dispense 1 spacer to use with inhaler    Physical Exam:  Vitals:   07/08/24 1057  BP: 128/88  Pulse: 65  Resp: 20  Temp: 98.5 F (36.9 C)  SpO2: 97%   There is no height or weight on file to calculate BMI. Wt Readings from Last 3 Encounters:  05/19/24 137 lb 6.4 oz (62.3 kg)  05/04/24 138 lb (62.6 kg)  03/16/24 136 lb 8 oz (61.9 kg)    Physical Exam Constitutional:      General: She is not in acute distress.    Appearance: She is well-developed. She is not diaphoretic.  HENT:     Head: Normocephalic and atraumatic.     Mouth/Throat:     Pharynx: No oropharyngeal exudate.  Eyes:     Conjunctiva/sclera: Conjunctivae normal.     Pupils: Pupils are equal, round, and reactive to light.  Cardiovascular:     Rate and Rhythm: Normal rate and regular rhythm.     Heart sounds: Normal heart sounds.  Pulmonary:     Effort: Pulmonary effort is normal.     Breath sounds: Normal breath sounds.  Abdominal:     General: Bowel  sounds are normal.     Palpations: Abdomen is soft.  Musculoskeletal:     Cervical back: Normal range of motion and neck supple.     Right lower leg: No edema.     Left lower leg: No edema.  Skin:    General: Skin is warm and dry.  Neurological:     Mental Status: She is alert.  Psychiatric:        Mood and Affect: Mood normal.     Labs reviewed: Basic Metabolic Panel: Recent Labs    03/01/24 0719 05/03/24 0735  NA 142  --   K 4.2  --   CL 105  --   CO2 29  --   GLUCOSE 86  --   BUN 18  --   CREATININE 0.90  --   CALCIUM  9.2  --   TSH 0.30* 0.83   Liver Function Tests: Recent Labs    03/01/24 0719  AST 28  ALT 42*  BILITOT 0.5  PROT 6.4   No results for input(s): LIPASE, AMYLASE in the last 8760 hours. No results for input(s): AMMONIA in the last 8760 hours. CBC: Recent Labs    03/01/24 0719  WBC 4.7  NEUTROABS 2,735  HGB 12.1  HCT 37.1  MCV 96.9  PLT 234   Lipid Panel: Recent Labs    03/01/24 0719  CHOL 176  HDL 74  LDLCALC 77  TRIG 150*  CHOLHDL 2.4   TSH: Recent Labs    03/01/24 0719 05/03/24 0735  TSH 0.30* 0.83   A1C: Lab Results  Component Value Date   HGBA1C 6.0 (H) 03/01/2024     Assessment/Plan Assessment and Plan Assessment & Plan Hypothyroidism Well-controlled with current levothyroxine  dosage. - Refilled levothyroxine  100 mcg oral daily.  Mixed hyperlipidemia Well-managed with atorvastatin . LDL levels are at goal. - Continue atorvastatin  80 mg oral daily.  Vitamin D  deficiency - Continue cholecalciferol 1000 Units oral daily.  Sleep disorder Managed with trazodone . - Continue trazodone  50 mg oral at bedtime.  Overactive bladder Well-controlled with vibegron . -  Continue vibegron  75 mg oral daily.  Osteoporosis Managed with risedronate . Calcium  supplementation recommended. - Continue risedronate  150 mg oral every 30 days with water on an empty stomach. - Recommended calcium  600 mg oral twice daily  and to continue Vit D 1000 units daily  - Encouraged weight-bearing exercises.  Lumbar spinal stenosis Managed with periodic injections and hydrocodone  for pain. - Continue follow-up with Dr. Avanell for injections as needed. - Use hydrocodone -acetaminophen  5-325 mg oral every 6 hours as needed for pain rx by phys medicine.   Paroxysmal supraventricular tachycardia Managed with metoprolol . Blood pressure well-controlled. - Continue metoprolol  succinate 25 mg oral daily.  History of cerebrovascular accident Managed with clopidogrel  for secondary prevention. - Continue clopidogrel  75 mg oral daily.  Prediabetes Monitored with A1c levels. Current A1c is 6.0%. - Continue monitoring A1c levels.   Next appt: 11/30/2024 for routine follow up  Morganne Haile K. Caro BODILY  [facility] [phone]     [1] No Known Allergies  "

## 2024-11-30 ENCOUNTER — Ambulatory Visit: Admitting: Nurse Practitioner

## 2024-12-28 ENCOUNTER — Ambulatory Visit: Payer: Self-pay | Admitting: Nurse Practitioner
